# Patient Record
Sex: Female | Born: 1935 | Race: White | Hispanic: No | Marital: Married | Smoking: Former smoker
Health system: Southern US, Community
[De-identification: ages and names within clinical notes are randomized; demographics above are authoritative.]

## PROBLEM LIST (undated history)

## (undated) DIAGNOSIS — T7840XA Allergy, unspecified, initial encounter: Secondary | ICD-10-CM

## (undated) DIAGNOSIS — E785 Hyperlipidemia, unspecified: Secondary | ICD-10-CM

## (undated) DIAGNOSIS — C801 Malignant (primary) neoplasm, unspecified: Secondary | ICD-10-CM

## (undated) DIAGNOSIS — F32A Depression, unspecified: Secondary | ICD-10-CM

## (undated) DIAGNOSIS — D649 Anemia, unspecified: Secondary | ICD-10-CM

## (undated) DIAGNOSIS — J449 Chronic obstructive pulmonary disease, unspecified: Secondary | ICD-10-CM

## (undated) DIAGNOSIS — E119 Type 2 diabetes mellitus without complications: Secondary | ICD-10-CM

## (undated) DIAGNOSIS — M1990 Unspecified osteoarthritis, unspecified site: Secondary | ICD-10-CM

## (undated) DIAGNOSIS — F419 Anxiety disorder, unspecified: Secondary | ICD-10-CM

## (undated) DIAGNOSIS — F329 Major depressive disorder, single episode, unspecified: Secondary | ICD-10-CM

## (undated) DIAGNOSIS — R0902 Hypoxemia: Secondary | ICD-10-CM

## (undated) DIAGNOSIS — F3342 Major depressive disorder, recurrent, in full remission: Secondary | ICD-10-CM

## (undated) DIAGNOSIS — H269 Unspecified cataract: Secondary | ICD-10-CM

## (undated) DIAGNOSIS — M8008XA Age-related osteoporosis with current pathological fracture, vertebra(e), initial encounter for fracture: Secondary | ICD-10-CM

## (undated) DIAGNOSIS — R569 Unspecified convulsions: Secondary | ICD-10-CM

## (undated) DIAGNOSIS — I10 Essential (primary) hypertension: Secondary | ICD-10-CM

## (undated) DIAGNOSIS — J45909 Unspecified asthma, uncomplicated: Secondary | ICD-10-CM

## (undated) DIAGNOSIS — I2699 Other pulmonary embolism without acute cor pulmonale: Secondary | ICD-10-CM

## (undated) HISTORY — DX: Malignant (primary) neoplasm, unspecified: C80.1

## (undated) HISTORY — PX: BREAST SURGERY: SHX581

## (undated) HISTORY — DX: Major depressive disorder, single episode, unspecified: F32.9

## (undated) HISTORY — DX: Age-related osteoporosis with current pathological fracture, vertebra(e), initial encounter for fracture: M80.08XA

## (undated) HISTORY — DX: Unspecified asthma, uncomplicated: J45.909

## (undated) HISTORY — DX: Unspecified osteoarthritis, unspecified site: M19.90

## (undated) HISTORY — DX: Type 2 diabetes mellitus without complications: E11.9

## (undated) HISTORY — DX: Essential (primary) hypertension: I10

## (undated) HISTORY — DX: Hypoxemia: R09.02

## (undated) HISTORY — DX: Chronic obstructive pulmonary disease, unspecified: J44.9

## (undated) HISTORY — DX: Unspecified cataract: H26.9

## (undated) HISTORY — DX: Hyperlipidemia, unspecified: E78.5

## (undated) HISTORY — DX: Depression, unspecified: F32.A

## (undated) HISTORY — DX: Allergy, unspecified, initial encounter: T78.40XA

---

## 1998-04-04 ENCOUNTER — Ambulatory Visit (HOSPITAL_COMMUNITY): Admission: RE | Admit: 1998-04-04 | Discharge: 1998-04-04 | Payer: Self-pay | Admitting: *Deleted

## 2000-04-28 ENCOUNTER — Encounter: Admission: RE | Admit: 2000-04-28 | Discharge: 2000-04-28 | Payer: Self-pay | Admitting: Family Medicine

## 2000-04-28 ENCOUNTER — Encounter: Payer: Self-pay | Admitting: Family Medicine

## 2001-03-03 ENCOUNTER — Emergency Department (HOSPITAL_COMMUNITY): Admission: EM | Admit: 2001-03-03 | Discharge: 2001-03-03 | Payer: Self-pay | Admitting: Emergency Medicine

## 2001-04-29 ENCOUNTER — Encounter: Admission: RE | Admit: 2001-04-29 | Discharge: 2001-04-29 | Payer: Self-pay | Admitting: Family Medicine

## 2001-04-29 ENCOUNTER — Encounter: Payer: Self-pay | Admitting: Family Medicine

## 2002-04-28 ENCOUNTER — Encounter: Admission: RE | Admit: 2002-04-28 | Discharge: 2002-04-28 | Payer: Self-pay

## 2014-01-27 ENCOUNTER — Ambulatory Visit: Payer: Self-pay | Admitting: Family Medicine

## 2014-07-04 DIAGNOSIS — J449 Chronic obstructive pulmonary disease, unspecified: Secondary | ICD-10-CM | POA: Diagnosis not present

## 2014-08-04 DIAGNOSIS — J449 Chronic obstructive pulmonary disease, unspecified: Secondary | ICD-10-CM | POA: Diagnosis not present

## 2014-11-23 ENCOUNTER — Telehealth: Payer: Self-pay | Admitting: Family Medicine

## 2014-11-23 ENCOUNTER — Encounter: Payer: Self-pay | Admitting: Family Medicine

## 2014-11-23 ENCOUNTER — Ambulatory Visit (INDEPENDENT_AMBULATORY_CARE_PROVIDER_SITE_OTHER): Payer: Medicare Other | Admitting: Family Medicine

## 2014-11-23 VITALS — BP 140/68 | HR 97 | Temp 98.6°F | Resp 16 | Ht 65.0 in | Wt 177.7 lb

## 2014-11-23 DIAGNOSIS — F17210 Nicotine dependence, cigarettes, uncomplicated: Secondary | ICD-10-CM | POA: Insufficient documentation

## 2014-11-23 DIAGNOSIS — Z1389 Encounter for screening for other disorder: Secondary | ICD-10-CM | POA: Insufficient documentation

## 2014-11-23 DIAGNOSIS — Z853 Personal history of malignant neoplasm of breast: Secondary | ICD-10-CM | POA: Insufficient documentation

## 2014-11-23 DIAGNOSIS — Z8781 Personal history of (healed) traumatic fracture: Secondary | ICD-10-CM | POA: Insufficient documentation

## 2014-11-23 DIAGNOSIS — F3342 Major depressive disorder, recurrent, in full remission: Secondary | ICD-10-CM

## 2014-11-23 DIAGNOSIS — J45909 Unspecified asthma, uncomplicated: Secondary | ICD-10-CM | POA: Insufficient documentation

## 2014-11-23 DIAGNOSIS — E118 Type 2 diabetes mellitus with unspecified complications: Secondary | ICD-10-CM | POA: Insufficient documentation

## 2014-11-23 DIAGNOSIS — J0100 Acute maxillary sinusitis, unspecified: Secondary | ICD-10-CM

## 2014-11-23 DIAGNOSIS — IMO0001 Reserved for inherently not codable concepts without codable children: Secondary | ICD-10-CM | POA: Insufficient documentation

## 2014-11-23 DIAGNOSIS — Z23 Encounter for immunization: Secondary | ICD-10-CM | POA: Insufficient documentation

## 2014-11-23 DIAGNOSIS — Z0000 Encounter for general adult medical examination without abnormal findings: Secondary | ICD-10-CM | POA: Insufficient documentation

## 2014-11-23 DIAGNOSIS — Z8719 Personal history of other diseases of the digestive system: Secondary | ICD-10-CM | POA: Insufficient documentation

## 2014-11-23 DIAGNOSIS — J441 Chronic obstructive pulmonary disease with (acute) exacerbation: Secondary | ICD-10-CM | POA: Insufficient documentation

## 2014-11-23 DIAGNOSIS — J309 Allergic rhinitis, unspecified: Secondary | ICD-10-CM | POA: Insufficient documentation

## 2014-11-23 DIAGNOSIS — K5790 Diverticulosis of intestine, part unspecified, without perforation or abscess without bleeding: Secondary | ICD-10-CM | POA: Insufficient documentation

## 2014-11-23 DIAGNOSIS — J449 Chronic obstructive pulmonary disease, unspecified: Secondary | ICD-10-CM | POA: Insufficient documentation

## 2014-11-23 DIAGNOSIS — M792 Neuralgia and neuritis, unspecified: Secondary | ICD-10-CM | POA: Insufficient documentation

## 2014-11-23 DIAGNOSIS — E785 Hyperlipidemia, unspecified: Secondary | ICD-10-CM | POA: Insufficient documentation

## 2014-11-23 DIAGNOSIS — M79673 Pain in unspecified foot: Secondary | ICD-10-CM | POA: Insufficient documentation

## 2014-11-23 DIAGNOSIS — Z9181 History of falling: Secondary | ICD-10-CM | POA: Insufficient documentation

## 2014-11-23 HISTORY — DX: Major depressive disorder, recurrent, in full remission: F33.42

## 2014-11-23 MED ORDER — AMOXICILLIN-POT CLAVULANATE 875-125 MG PO TABS: 1.0000 | ORAL_TABLET | Freq: Two times a day (BID) | ORAL | Status: DC

## 2014-11-23 NOTE — Progress Notes (Signed)
Name: Jocelyn Burgess   MRN: [id number]    DOB: [birth year]   Date:11/23/2014       Progress Note  Subjective  Chief Complaint  Chief Complaint  Patient presents with  . Sinusitis    patient has been having facial pain & pressure for about a week or so.    HPI  Patient is here today with concerns regarding the following symptoms congestion, post nasal drip, ear pressure and sinus pressure that started days ago.  Associated with fatigue. Not associated with fevers, chills, headaches. Has tried the following home remedies: none.    Past Medical History  Diagnosis Date  . Allergy   . Cancer   . Diabetes mellitus without complication   . Arthritis   . Asthma   . Cataract   . COPD (chronic obstructive pulmonary disease)   . Depression     sometimes  . Hyperlipidemia   . Hypertension   . Oxygen deficiency     History  Substance Use Topics  . Smoking status: Current Every Day Smoker    Types: Cigarettes  . Smokeless tobacco: Current User  . Alcohol Use: No     Current outpatient prescriptions:  .  albuterol (VENTOLIN HFA) 108 (90 BASE) MCG/ACT inhaler, Inhale into the lungs., Disp: , Rfl:  .  citalopram (CELEXA) 20 MG tablet, Take by mouth., Disp: , Rfl:  .  cloNIDine (CATAPRES) 0.1 MG tablet, Take by mouth., Disp: , Rfl:  .  diazepam (VALIUM) 5 MG tablet, Take by mouth., Disp: , Rfl:  .  gabapentin (NEURONTIN) 300 MG capsule, Take by mouth., Disp: , Rfl:  .  glimepiride (AMARYL) 2 MG tablet, Take by mouth., Disp: , Rfl:  .  glucose blood (ONETOUCH VERIO) test strip, ONETOUCH VERIO (In Vitro Strip)  1 (one) Strip Strip use as directed to check blood glucose bid for 90 days  Quantity: 180;  Refills: 1   Ordered :27-Mar-2014  Bobetta Lime MD;  Started 27-Mar-2014 Active, Disp: , Rfl:  .  hydrochlorothiazide (HYDRODIURIL) 25 MG tablet, Take by mouth., Disp: , Rfl:  .  loratadine (CLARITIN) 10 MG tablet, Take by mouth., Disp: , Rfl:  .  lovastatin (MEVACOR) 20 MG tablet,  Take by mouth., Disp: , Rfl:  .  meclizine (ANTIVERT) 25 MG tablet, Take by mouth., Disp: , Rfl:  .  meloxicam (MOBIC) 15 MG tablet, Take by mouth., Disp: , Rfl:  .  metFORMIN (GLUCOPHAGE) 500 MG tablet, Take by mouth., Disp: , Rfl:  .  metFORMIN (GLUCOPHAGE) 500 MG tablet, Take by mouth., Disp: , Rfl:  .  montelukast (SINGULAIR) 10 MG tablet, Take by mouth., Disp: , Rfl:  .  predniSONE (DELTASONE) 5 MG tablet, Take by mouth., Disp: , Rfl:  .  theophylline (THEODUR) 200 MG 12 hr tablet, Take by mouth., Disp: , Rfl:  .  tiotropium (SPIRIVA HANDIHALER) 18 MCG inhalation capsule, Place into inhaler and inhale., Disp: , Rfl:  .  aspirin 81 MG tablet, Take by mouth., Disp: , Rfl:  .  gemfibrozil (LOPID) 600 MG tablet, Take by mouth., Disp: , Rfl:  .  HYDROcodone-acetaminophen (NORCO) 5-325 MG per tablet, Take by mouth., Disp: , Rfl:   Allergies  Allergen Reactions  . Ciprofloxacin   . Inhaler Decongestant  [Methamphetamine]   . Molds & Smuts     ROS  10 Systems reviewed and is negative except as mentioned in HPI.   Objective  Filed Vitals:   11/23/14 1601  BP: 140/68  Pulse: 97  Temp: 98.6 F (37 C)  TempSrc: Oral  Resp: 16  Height: 5\' 5"  (1.651 m)  Weight: 177 lb 11.2 oz (80.604 kg)  SpO2: 91%     Physical Exam  Constitutional: Patient appears well-developed and well-nourished. In no acute distress but does appear to be fatigued from acute illness. HEENT:  - Head: Normocephalic and atraumatic.  - Ears: RIGHT TM bulging with minimal clear exudate, LEFT TM bulging with minimal clear exudate.  - Nose: Nasal mucosa boggy and congested.  - Mouth/Throat: Oropharynx is moist with slight erythema of bilateral tonsils without hypertrophy or exudates. Post nasal drainage present.  - Eyes: Conjunctivae clear, EOM movements normal. PERRLA. No scleral icterus.  Neck: Normal range of motion. Neck supple. No JVD present. No thyromegaly present. No local lymphadenopathy. Cardiovascular:  Regular rate, regular rhythm with no murmurs heard.  Pulmonary/Chest: Effort normal and breath sounds reduced throughout lung fields which is her baseline. Musculoskeletal: Normal range of motion bilateral UE and LE, no joint effusions. Skin: Skin is warm and dry. No rash noted. Psychiatric: Patient has a normal mood and affect. Behavior is normal in office today. Judgment and thought content normal in office today.   Assessment & Plan  1. Acute maxillary sinusitis, recurrence not specified  - amoxicillin-clavulanate (AUGMENTIN) 875-125 MG per tablet; Take 1 tablet by mouth 2 (two) times daily.  Dispense: 20 tablet; Refill: 0  Etiologies include allergic rhinitis, viral or bacterial infection. Instructed patient on increasing hydration, nasal saline spray, steam inhalation, NSAID if tolerated and not contraindicated. If not already doing so start taking daily anti-histamine and use a steroid nasal spray. If symptoms persist/worsen may consider antibiotic therapy.

## 2014-11-23 NOTE — Patient Instructions (Signed)

## 2014-11-23 NOTE — Telephone Encounter (Signed)
Pt's husband called stating that his wife has been experiencing sinus headaches and wanted to know what she could do for it or if something could be called into her pharmacy.  [facility] [address]

## 2014-11-23 NOTE — Telephone Encounter (Signed)
Contacted patient to see how I can be of assistance to her. She stated that she was very congested, had sinus pain and pressure. Patient was asked if she could come in today. Patient agreed and was scheduled today @ 3:30pm

## 2014-11-30 ENCOUNTER — Other Ambulatory Visit: Payer: Self-pay | Admitting: Family Medicine

## 2014-11-30 DIAGNOSIS — E785 Hyperlipidemia, unspecified: Secondary | ICD-10-CM

## 2014-12-04 ENCOUNTER — Telehealth: Payer: Self-pay | Admitting: Family Medicine

## 2014-12-04 NOTE — Telephone Encounter (Signed)
Patient's husband was informed of Dr. Allie Dimmer instructions/ recommendations and I encouraged him to made sure she takes the medications as directed by Dr. Nadine Counts. Patient's huband was advised that she takes no more than 2000mg /dayof the Metformin and that it was only to decrease her sugar levels. Patient expressed verbal understand and stated thanks for calling me back.

## 2014-12-04 NOTE — Telephone Encounter (Signed)
Patient's husband called stating that his wife's blood sugar normally runs 87-117 but today it was 247 after eating. Patient ate a couple of eggs, gravy with toast. Patient has not taken any prednisone since Saturday. Patient was not able to get the Theodur. Patient's husband stated that she has not had any symptoms like shakes, dizziness, etc. Patient wants to know if she could get back on the [facility] (they told them they only had 19 pills).

## 2014-12-04 NOTE — Telephone Encounter (Signed)
Please explain to patient and husband: Jocelyn Burgess (aka Theophylline) is an old medication used for asthma/copd as a last resort to help with inflammation in the lungs. Back in Feb 2016 this medication was nationally backordered and so due to the shortage we discussed putting her on prednisone if needed. She should currently be on prednisone 5mg  once a day to help her breathing symptoms.   Her sugars being in the 200s should not be related to Jocelyn Burgess. However sugars can rise with use of Prednisone. Carbohydrates found in gravy and toast can increase blood sugars. Her two medications for blood sugars are Glimepiride 2mg  twice a day and Metformin 500mg  twice a day. If she is still having sugars in the 200s start taking Metformin 500mg  tablet THREE or FOUR times a day (so max total is 2000mg /day). If this helps let me know to change the medication quantity and send to their local pharmacy.   If this does not help and her sugars continue to trend in the 200s or higher or she has issues with her COPD/Asthma then I will have to see her in the office. Thank you.

## 2014-12-11 ENCOUNTER — Other Ambulatory Visit: Payer: Self-pay

## 2014-12-12 ENCOUNTER — Telehealth: Payer: Self-pay | Admitting: Family Medicine

## 2014-12-12 ENCOUNTER — Other Ambulatory Visit: Payer: Self-pay

## 2014-12-12 DIAGNOSIS — E113299 Type 2 diabetes mellitus with mild nonproliferative diabetic retinopathy without macular edema, unspecified eye: Secondary | ICD-10-CM

## 2014-12-12 MED ORDER — GLUCOSE BLOOD VI STRP: ORAL_STRIP | Status: DC

## 2014-12-12 NOTE — Telephone Encounter (Signed)
Pt only has 3 tests strips left of Accu-Check Aviva Plus. Please send [facility] in [place]. Please call once completed. [phone]

## 2014-12-12 NOTE — Telephone Encounter (Signed)
Samson Frederic at 12/12/2014 9:45 AM     Status: Signed       Expand All Collapse All   Pt only has 3 tests strips left of Accu-Check Aviva Plus. Please send [facility] in [place]. Please call once completed. [phone]

## 2014-12-12 NOTE — Telephone Encounter (Signed)
Refill request was submitted to Dr. Bobetta Lime

## 2014-12-12 NOTE — Telephone Encounter (Signed)
Strips were electronically submitted

## 2014-12-13 ENCOUNTER — Other Ambulatory Visit: Payer: Self-pay

## 2014-12-13 ENCOUNTER — Telehealth: Payer: Self-pay | Admitting: Family Medicine

## 2014-12-13 DIAGNOSIS — E113299 Type 2 diabetes mellitus with mild nonproliferative diabetic retinopathy without macular edema, unspecified eye: Secondary | ICD-10-CM

## 2014-12-13 MED ORDER — GLUCOSE BLOOD VI STRP: ORAL_STRIP | Status: DC

## 2014-12-13 NOTE — Telephone Encounter (Signed)
Patient's husband came in stating that [facility] said her strips will not be sent until the 9th so he needs something now to [facility] [address]. rx was sent to Dr. Nadine Counts for approval & submission.

## 2014-12-13 NOTE — Telephone Encounter (Signed)
[facility] in [place], spoke to [employer] [facility]) at 12:04pm, and she confirmed the rx was sent yesterday electronically.

## 2014-12-13 NOTE — Telephone Encounter (Signed)
Was told the the accu check test strips was electronically sent to the pharmacy on yesterday, he just called them and they do not have it. Please resend thank you

## 2014-12-14 NOTE — Telephone Encounter (Signed)
Complete

## 2014-12-17 ENCOUNTER — Other Ambulatory Visit: Payer: Self-pay | Admitting: Family Medicine

## 2014-12-17 DIAGNOSIS — I10 Essential (primary) hypertension: Secondary | ICD-10-CM

## 2014-12-30 ENCOUNTER — Other Ambulatory Visit: Payer: Self-pay | Admitting: Family Medicine

## 2014-12-30 DIAGNOSIS — J449 Chronic obstructive pulmonary disease, unspecified: Secondary | ICD-10-CM

## 2015-01-05 ENCOUNTER — Telehealth: Payer: Self-pay

## 2015-01-10 ENCOUNTER — Other Ambulatory Visit: Payer: Self-pay | Admitting: Family Medicine

## 2015-01-10 DIAGNOSIS — I10 Essential (primary) hypertension: Secondary | ICD-10-CM

## 2015-01-11 ENCOUNTER — Other Ambulatory Visit: Payer: Self-pay | Admitting: Family Medicine

## 2015-01-11 DIAGNOSIS — E781 Pure hyperglyceridemia: Secondary | ICD-10-CM

## 2015-01-14 ENCOUNTER — Other Ambulatory Visit: Payer: Self-pay | Admitting: Family Medicine

## 2015-01-14 DIAGNOSIS — F419 Anxiety disorder, unspecified: Principal | ICD-10-CM

## 2015-01-14 DIAGNOSIS — F329 Major depressive disorder, single episode, unspecified: Secondary | ICD-10-CM

## 2015-01-14 DIAGNOSIS — F32A Depression, unspecified: Secondary | ICD-10-CM

## 2015-01-15 ENCOUNTER — Telehealth: Payer: Self-pay | Admitting: Family Medicine

## 2015-01-15 NOTE — Telephone Encounter (Signed)
I printed it out this morning, can't be faxed it's a controlled substance.

## 2015-01-15 NOTE — Telephone Encounter (Signed)
ALL RX WERE GOT EXCEPT FOR HER DIAZIPAM WAS NOT THERE. PLEASE REFILL FOR THE PATIENT IS OUT. [facility] [address]

## 2015-01-15 NOTE — Telephone Encounter (Signed)
Patient was called this morning at 8:58am, which is noted on the printed rx, and told that her rx was ready for pick up.

## 2015-01-22 ENCOUNTER — Ambulatory Visit (INDEPENDENT_AMBULATORY_CARE_PROVIDER_SITE_OTHER): Payer: Medicare Other | Admitting: Family Medicine

## 2015-01-22 ENCOUNTER — Encounter: Payer: Self-pay | Admitting: Family Medicine

## 2015-01-22 ENCOUNTER — Other Ambulatory Visit: Payer: Self-pay | Admitting: Family Medicine

## 2015-01-22 VITALS — BP 110/60 | HR 88 | Temp 97.2°F | Resp 20 | Ht 65.0 in | Wt 183.7 lb

## 2015-01-22 DIAGNOSIS — E118 Type 2 diabetes mellitus with unspecified complications: Secondary | ICD-10-CM | POA: Diagnosis not present

## 2015-01-22 DIAGNOSIS — F329 Major depressive disorder, single episode, unspecified: Secondary | ICD-10-CM

## 2015-01-22 DIAGNOSIS — E785 Hyperlipidemia, unspecified: Secondary | ICD-10-CM

## 2015-01-22 DIAGNOSIS — F418 Other specified anxiety disorders: Secondary | ICD-10-CM | POA: Diagnosis not present

## 2015-01-22 DIAGNOSIS — I10 Essential (primary) hypertension: Secondary | ICD-10-CM

## 2015-01-22 DIAGNOSIS — R42 Dizziness and giddiness: Secondary | ICD-10-CM

## 2015-01-22 DIAGNOSIS — F419 Anxiety disorder, unspecified: Secondary | ICD-10-CM

## 2015-01-22 LAB — GLUCOSE, POCT (MANUAL RESULT ENTRY): POC Glucose: 200 mg/dl — AB (ref 70–99)

## 2015-01-22 MED ORDER — METFORMIN HCL 500 MG PO TABS: 1000.0000 mg | ORAL_TABLET | Freq: Two times a day (BID) | ORAL | Status: DC

## 2015-01-22 MED ORDER — METFORMIN HCL 1000 MG PO TABS: 1000.0000 mg | ORAL_TABLET | Freq: Two times a day (BID) | ORAL | Status: DC

## 2015-01-22 MED ORDER — CITALOPRAM HYDROBROMIDE 20 MG PO TABS: 20.0000 mg | ORAL_TABLET | Freq: Every day | ORAL | Status: DC

## 2015-01-22 NOTE — Progress Notes (Signed)
Name: Jocelyn Burgess   MRN: [id number]    DOB: 12/20/1931   Date:01/22/2015       Progress Note  Subjective  Chief Complaint  Chief Complaint  Patient presents with  . Medication Refill    HPI  Patient is here for routine follow up of Hypertension. First diagnosed with hypertension several years ago. Current anti-hypertension medication regimen includes dietary modification, weight management and clonidine 0.1mg  twice a day, HCTZ 25mg  daily. Patient is following physician recommended management. Checking blood pressure outside of physician office. Results average systolic 235-361 and average diastolic 44-31.  Associated symptoms do not include headache, dizziness, nausea, lower extremity swelling, shortness of breath worse than usual, chest pain, numbness.  Patient is here for routine follow up of Diabetes Type II.  Current diabetes medication regimen includes glimepiride 2mg  twice a day plus metformin 500mg  twice a day.  About 1 month ago patient's husband had called concerned that Jocelyn Burgess's blood glucose had gone from the lower 100s to the 200s.  He was instructed to increase her metformin 500mg  to 3 or 4 times a day, no more than 2000mg /day. Patient is taking medications as instructed. Overall the patient feels that their blood glucose is not well controlled. Checking blood glucose 2-3 times a day consistently. Fasting glucose range 110-135. Meal time glucose range 150s. Lowest glucose result 78. Patient denies any related symptoms such as blurry vision, increased fatigue, polydipsia, polyphagia, polyuria, poor wound healing, pruritus, skin dryness and weight loss.  Last Hba1c was done 10/05/14 and was 7.5%. Related medical conditions include Hyperlipidemia for which she is currently taking Lopid 600mg  twice a day and Lovastatin 20mg  once a day.  Glycemic control is complicated by poor COPD control, poor compliance with smoking cessation efforts and thus needing daily prednisone 5mg  since  Theophyline was nationally backordered earlier this year.   Needs refill of celexa.   Past Medical History  Diagnosis Date  . Allergy   . Cancer   . Diabetes mellitus without complication   . Arthritis   . Asthma   . Cataract   . COPD (chronic obstructive pulmonary disease)   . Depression     sometimes  . Hyperlipidemia   . Hypertension   . Oxygen deficiency     Past Surgical History  Procedure Laterality Date  . Breast surgery Left     Family History  Problem Relation Age of Onset  . Arthritis Mother   . Arthritis Father   . Cancer Sister     History   Social History  . Marital Status: Married    Spouse Name: N/A  . Number of Children: N/A  . Years of Education: N/A   Occupational History  . Not on file.   Social History Main Topics  . Smoking status: Current Every Day Smoker    Types: Cigarettes  . Smokeless tobacco: Current User  . Alcohol Use: No  . Drug Use: No  . Sexual Activity: No   Other Topics Concern  . Not on file   Social History Narrative     Current outpatient prescriptions:  .  albuterol (VENTOLIN HFA) 108 (90 BASE) MCG/ACT inhaler, Inhale into the lungs., Disp: , Rfl:  .  aspirin 81 MG tablet, Take by mouth., Disp: , Rfl:  .  citalopram (CELEXA) 20 MG tablet, Take by mouth., Disp: , Rfl:  .  cloNIDine (CATAPRES) 0.1 MG tablet, TAKE 1 TABLET BY MOUTH TWICE A DAY, Disp: 180 tablet, Rfl: 3 .  diazepam (  VALIUM) 5 MG tablet, TAKE 1 TABLET BY MOUTH TWICE A DAY, Disp: 180 tablet, Rfl: 1 .  gabapentin (NEURONTIN) 300 MG capsule, Take by mouth., Disp: , Rfl:  .  gemfibrozil (LOPID) 600 MG tablet, TAKE 1 TABLET BY MOUTH TWICE A DAY, Disp: 180 tablet, Rfl: 3 .  glimepiride (AMARYL) 2 MG tablet, Take by mouth., Disp: , Rfl:  .  glucose blood (ACCU-CHEK AVIVA PLUS) test strip, Use as instructed to check blood sugar 3x/day, Disp: 100 each, Rfl: 5 .  hydrochlorothiazide (HYDRODIURIL) 25 MG tablet, TAKE 1 TABLET BY MOUTH DAILY, Disp: 90 tablet,  Rfl: 2 .  HYDROcodone-acetaminophen (NORCO) 5-325 MG per tablet, Take by mouth., Disp: , Rfl:  .  loratadine (CLARITIN) 10 MG tablet, Take by mouth., Disp: , Rfl:  .  lovastatin (MEVACOR) 20 MG tablet, TAKE 1 TABLET BY MOUTH EVERY DAY WITH EVENING MEAL, Disp: 90 tablet, Rfl: 1 .  meclizine (ANTIVERT) 25 MG tablet, TAKE 1 TABLET BY MOUTH EVERY 8 HOURS AS NEEDED, Disp: 200 tablet, Rfl: 1 .  meloxicam (MOBIC) 15 MG tablet, Take by mouth., Disp: , Rfl:  .  metFORMIN (GLUCOPHAGE) 500 MG tablet, Take by mouth., Disp: , Rfl:  .  montelukast (SINGULAIR) 10 MG tablet, TAKE 1 TABLET BY MOUTH EVERY EVENING, Disp: 90 tablet, Rfl: 3 .  predniSONE (DELTASONE) 5 MG tablet, Take by mouth., Disp: , Rfl:  .  theophylline (THEODUR) 200 MG 12 hr tablet, Take by mouth., Disp: , Rfl:  .  tiotropium (SPIRIVA HANDIHALER) 18 MCG inhalation capsule, Place into inhaler and inhale., Disp: , Rfl:   Allergies  Allergen Reactions  . Ciprofloxacin   . Inhaler Decongestant  [Methamphetamine]   . Molds & Smuts      ROS  CONSTITUTIONAL: No significant weight changes, fever, chills, weakness or fatigue.  HEENT:  - Eyes: No visual changes.  - Ears: No auditory changes. No pain.  - Nose: No sneezing, congestion, runny nose. - Throat: No sore throat. No changes in swallowing. SKIN: No rash or itching.  CARDIOVASCULAR: No chest pain, chest pressure or chest discomfort. No palpitations or edema.  RESPIRATORY: No shortness of breath, cough or sputum.  GASTROINTESTINAL: No anorexia, nausea, vomiting. No changes in bowel habits. No abdominal pain or blood.  GENITOURINARY: No dysuria. No frequency. No discharge.  NEUROLOGICAL: No headache, dizziness, syncope, paralysis, ataxia, numbness or tingling in the extremities. No memory changes. No change in bowel or bladder control.  MUSCULOSKELETAL: No joint pain. No muscle pain. HEMATOLOGIC: No anemia, bleeding or bruising.  LYMPHATICS: No enlarged lymph nodes.  PSYCHIATRIC: No  change in mood. No change in sleep pattern.  ENDOCRINOLOGIC: No reports of sweating, cold or heat intolerance. No polyuria or polydipsia.   Objective  Filed Vitals:   01/22/15 1512  BP: 110/60  Pulse: 88  Temp: 97.2 F (36.2 C)  TempSrc: Oral  Resp: 20  Height: 5\' 5"  (1.651 m)  Weight: 183 lb 11.2 oz (83.326 kg)  SpO2: 94%   Body mass index is 30.57 kg/(m^2).   POCT Glucose today 200mg /dL  Physical Exam  Constitutional: Patient appears well-developed and well-nourished. In no distress. Smells of cigarette smoke as usual. HEENT:  - Head: Normocephalic and atraumatic.  - Ears: Bilateral TMs gray, no erythema or effusion - Nose: Nasal mucosa moist, boggy - Mouth/Throat: Oropharynx is clear and moist. No tonsillar hypertrophy or erythema. No post nasal drainage.  - Eyes: Conjunctivae clear, EOM movements normal. PERRLA. No scleral icterus.  Neck: Normal range  of motion. Neck supple. No JVD present. No thyromegaly present.  Cardiovascular: Normal rate, regular rhythm and normal heart sounds.  No murmur heard.  Pulmonary/Chest: Effort normal and breath sounds decreased in all lung fields (baseline). No respiratory distress. Musculoskeletal: Normal range of motion bilateral UE and LE, no joint effusions. Peripheral vascular: Bilateral LE no edema. Neurological: CN II-XII grossly intact with no focal deficits. Alert and oriented to person, place, and time. Coordination, balance, strength, speech and gait are normal.  Skin: Skin is warm and dry. No rash noted. No erythema.  Psychiatric: Patient has a normal mood and affect. Behavior is normal in office today. Judgment and thought content normal in office today.   Assessment & Plan  1. Diabetes mellitus type 2, controlled, with complications Continue increased dose of metformin 1000mg  twice a day. Monitor for low sugars.   Patient's Hba1c goal is <8% previously which is reasonable if elderly and is at risk for hypoglycemic events  especially on sulfonyluria which she is not willing to discontinue due to cost.   Encouraged patient to continue efforts on checking blood glucose on a daily basis. Fasting blood glucose control goal is 100-130mg /dL and post prandial blood glucose control is 180mg /dL.  Reviewed diet, exercise, lifestyle changes and current medication regimen pertaining to diabetes with the patient.     - POCT glucose (manual entry); Standing - POCT glucose (manual entry) - metFORMIN (GLUCOPHAGE) 1000 MG tablet; Take 1 tablet by mouth 2 (two) times daily with a meal.  Dispense: 180 tablet; Refill: 3  2. HLD (hyperlipidemia) Continue statin therapy.  3. Hypertension goal BP (blood pressure) < 150/90 Well controled.  4. Anxiety and depression Well controled, refill.  - citalopram (CELEXA) 20 MG tablet; Take 1 tablet (20 mg total) by mouth daily.  Dispense: 90 tablet; Refill: 3

## 2015-01-31 ENCOUNTER — Other Ambulatory Visit: Payer: Self-pay | Admitting: Family Medicine

## 2015-01-31 DIAGNOSIS — E118 Type 2 diabetes mellitus with unspecified complications: Secondary | ICD-10-CM

## 2015-02-06 NOTE — Telephone Encounter (Signed)
ERRONEOUS ENTRY.

## 2015-02-09 ENCOUNTER — Other Ambulatory Visit: Payer: Self-pay | Admitting: Family Medicine

## 2015-02-09 ENCOUNTER — Telehealth: Payer: Self-pay | Admitting: Family Medicine

## 2015-02-09 DIAGNOSIS — J449 Chronic obstructive pulmonary disease, unspecified: Secondary | ICD-10-CM

## 2015-02-09 MED ORDER — THEOPHYLLINE ER 400 MG PO CP24: 400.0000 mg | ORAL_CAPSULE | Freq: Every day | ORAL | Status: DC

## 2015-02-09 NOTE — Telephone Encounter (Signed)
Done

## 2015-02-09 NOTE — Telephone Encounter (Signed)
PT HAS BEEN TAKING THEOPHYLLINE 200MG  12 HR TABLETS. [facility] THIS [facility]ICATION. [facility] ON [address] CAN 400 MG EXTENDED RELEASE AND WANTS TO KNOW IF YOU COULD CALL THIS IN TO THEM AT [facility]. PLEASE LET THEM KNOW WHEN THIS IS DONE.

## 2015-02-09 NOTE — Telephone Encounter (Signed)
Refill request was sent to Dr. Ashany Sundaram for approval and submission.  

## 2015-02-14 ENCOUNTER — Telehealth: Payer: Self-pay | Admitting: Family Medicine

## 2015-02-14 NOTE — Telephone Encounter (Signed)
[name] just wanted to inform you that he found the medication Theophylline 400mg  at [facility] on [address].

## 2015-02-20 ENCOUNTER — Telehealth: Payer: Self-pay | Admitting: Family Medicine

## 2015-02-20 NOTE — Telephone Encounter (Signed)
Pt would like a call back about her sugar. Pt states it seems to be running a little high. This morning it was running 187. She is currently on the metformin.

## 2015-02-20 NOTE — Telephone Encounter (Signed)
Contacted this patient to find out how we could be of assistance to her. Patient stated that she has been taking (2) 1000mg  of Metformin and it has still been running way over 100. Today it was 187mg . After consulting with Dr. Nadine Counts, patient was scheduled for an office visit tomorrow (02/21/15) at 9:30am.

## 2015-02-21 ENCOUNTER — Ambulatory Visit (INDEPENDENT_AMBULATORY_CARE_PROVIDER_SITE_OTHER): Payer: Medicare Other | Admitting: Family Medicine

## 2015-02-21 ENCOUNTER — Encounter: Payer: Self-pay | Admitting: Family Medicine

## 2015-02-21 VITALS — BP 112/60 | HR 110 | Temp 97.9°F | Resp 18 | Ht 65.0 in | Wt 185.5 lb

## 2015-02-21 DIAGNOSIS — F329 Major depressive disorder, single episode, unspecified: Secondary | ICD-10-CM

## 2015-02-21 DIAGNOSIS — Z23 Encounter for immunization: Secondary | ICD-10-CM | POA: Diagnosis not present

## 2015-02-21 DIAGNOSIS — F32A Depression, unspecified: Secondary | ICD-10-CM

## 2015-02-21 DIAGNOSIS — I10 Essential (primary) hypertension: Secondary | ICD-10-CM

## 2015-02-21 DIAGNOSIS — F419 Anxiety disorder, unspecified: Secondary | ICD-10-CM

## 2015-02-21 DIAGNOSIS — F418 Other specified anxiety disorders: Secondary | ICD-10-CM

## 2015-02-21 DIAGNOSIS — E118 Type 2 diabetes mellitus with unspecified complications: Secondary | ICD-10-CM | POA: Diagnosis not present

## 2015-02-21 LAB — POCT GLYCOSYLATED HEMOGLOBIN (HGB A1C): Hemoglobin A1C: 7.4

## 2015-02-21 MED ORDER — GLIMEPIRIDE 4 MG PO TABS: 4.0000 mg | ORAL_TABLET | Freq: Two times a day (BID) | ORAL | Status: DC

## 2015-02-21 NOTE — Progress Notes (Signed)
Name: Jocelyn Burgess   MRN: [id number]    DOB: April 22, 1932   Date:02/21/2015       Progress Note  Subjective  Chief Complaint  Chief Complaint  Patient presents with  . Medication Management    patient has been having some issues with her blood sugars running high.    HPI  Patient is here for routine follow up of Diabetes Type II.  Current diabetes medication regimen includes glimepiride 4 mg twice a day plus metformin 1000 mg twice a day. Jocelyn Burgess's blood glucose has been fluctuating more, gone from the lower 100s to the 200s over the past few months. Patient is taking medications as instructed. Overall the patient feels that their blood glucose is not well controlled. Checking blood glucose 2-3 times a day consistently. Fasting glucose range 110-135. Meal time glucose range 150-200s. Lowest glucose result 89. Patient denies any related symptoms such as blurry vision, increased fatigue, polydipsia, polyphagia, polyuria, poor wound healing, pruritus, skin dryness and weight loss. Last Hba1c was done 10/05/14 and was 7.5%. Related medical conditions include Hyperlipidemia for which she is currently taking Lopid 600 mg twice a day and Lovastatin 20 mg once a day. Glycemic control is complicated by poor COPD control, poor compliance with smoking cessation efforts and thus needing daily prednisone 5mg  since Jocelyn Burgess was nationally backordered earlier this year. She reports today being back on Jocelyn Burgess for 1 week now and off of the prednisone.  Patient is here for routine follow up of Hypertension. First diagnosed with hypertension several years ago. Current anti-hypertension medication regimen includes dietary modification, weight management and clonidine 0.1mg  twice a day, HCTZ 25mg  daily. Patient is following physician recommended management. Checking blood pressure outside of physician office. Results average systolic 536-644 and average diastolic 03-47. Associated symptoms do not  include headache, dizziness, nausea, lower extremity swelling, shortness of breath worse than usual, chest pain, numbness.   Depression: Patient complains of resolved depression. She complains of fatigue. Onset was approximately several years ago, completely resolved since that time.  She denies current suicidal and homicidal plan or intent.   Family history significant for anxiety, depression and heart disease.Possible organic causes contributing are: medications, endocrine/metabolic, nutritional.  Risk factors: previous episode of depression Previous treatment includes Celexa and individual therapy.  TODAY SHE REPORTS SELF DISCONTINUING HER CELEXA 3 WEEKS AGO WITH NO REPORTED SIDE EFFECTS.   Patient Active Problem List   Diagnosis Date Noted  . Need for immunization against influenza 02/21/2015  . Hypertension goal BP (blood pressure) < 140/90 01/22/2015  . Acute antritis 11/23/2014  . Allergic rhinitis 11/23/2014  . Anxiety and depression 11/23/2014  . AB (asthmatic bronchitis) 11/23/2014  . Bronchitis with chronic airway obstruction 11/23/2014  . Foot pain 11/23/2014  . Cigarette smoker 11/23/2014  . COPD, severe 11/23/2014  . DD (diverticular disease) 11/23/2014  . Chronic obstructive pulmonary emphysema 11/23/2014  . Personal history of malignant neoplasm of breast 11/23/2014  . History of digestive disease 11/23/2014  . Personal history of healed traumatic fracture 11/23/2014  . HLD (hyperlipidemia) 11/23/2014  . Peripheral neuropathic pain 11/23/2014  . At risk for falling 11/23/2014  . Diabetes mellitus type 2, controlled, with complications 42/59/5638    Social History  Substance Use Topics  . Smoking status: Current Every Day Smoker    Types: Cigarettes  . Smokeless tobacco: Current User  . Alcohol Use: No     Current outpatient prescriptions:  .  albuterol (VENTOLIN HFA) 108 (90 BASE) MCG/ACT inhaler, Inhale  into the lungs., Disp: , Rfl:  .  aspirin 81 MG tablet,  Take by mouth., Disp: , Rfl:  .  cloNIDine (CATAPRES) 0.1 MG tablet, TAKE 1 TABLET BY MOUTH TWICE A DAY, Disp: 180 tablet, Rfl: 3 .  diazepam (VALIUM) 5 MG tablet, TAKE 1 TABLET BY MOUTH TWICE A DAY, Disp: 180 tablet, Rfl: 1 .  gabapentin (NEURONTIN) 300 MG capsule, Take by mouth., Disp: , Rfl:  .  gemfibrozil (LOPID) 600 MG tablet, TAKE 1 TABLET BY MOUTH TWICE A DAY, Disp: 180 tablet, Rfl: 3 .  glimepiride (AMARYL) 4 MG tablet, Take 1 tablet (4 mg total) by mouth 2 (two) times daily., Disp: 180 tablet, Rfl: 3 .  glucose blood (ACCU-CHEK AVIVA PLUS) test strip, Use as instructed to check blood sugar 3x/day, Disp: 100 each, Rfl: 5 .  hydrochlorothiazide (HYDRODIURIL) 25 MG tablet, TAKE 1 TABLET BY MOUTH DAILY, Disp: 90 tablet, Rfl: 2 .  HYDROcodone-acetaminophen (NORCO) 5-325 MG per tablet, Take by mouth., Disp: , Rfl:  .  loratadine (CLARITIN) 10 MG tablet, Take by mouth., Disp: , Rfl:  .  lovastatin (MEVACOR) 20 MG tablet, TAKE 1 TABLET BY MOUTH EVERY DAY WITH EVENING MEAL, Disp: 90 tablet, Rfl: 1 .  meclizine (ANTIVERT) 25 MG tablet, TAKE 1 TABLET BY MOUTH EVERY 8 HOURS AS NEEDED, Disp: 200 tablet, Rfl: 1 .  meloxicam (MOBIC) 15 MG tablet, Take by mouth., Disp: , Rfl:  .  metFORMIN (GLUCOPHAGE) 1000 MG tablet, Take 1 tablet (1,000 mg total) by mouth 2 (two) times daily with a meal., Disp: 180 tablet, Rfl: 3 .  montelukast (SINGULAIR) 10 MG tablet, TAKE 1 TABLET BY MOUTH EVERY EVENING, Disp: 90 tablet, Rfl: 3 .  predniSONE (DELTASONE) 5 MG tablet, Take by mouth., Disp: , Rfl:  .  theophylline (THEO-24) 400 MG 24 hr capsule, Take 1 capsule (400 mg total) by mouth daily., Disp: 90 capsule, Rfl: 3 .  tiotropium (SPIRIVA HANDIHALER) 18 MCG inhalation capsule, Place into inhaler and inhale., Disp: , Rfl:   Past Surgical History  Procedure Laterality Date  . Breast surgery Left     Family History  Problem Relation Age of Onset  . Arthritis Mother   . Arthritis Father   . Cancer Sister      brain  . Heart disease Sister   . Stroke Sister   . Diabetes Brother     Allergies  Allergen Reactions  . Ciprofloxacin   . Inhaler Decongestant  [Methamphetamine]   . Molds & Smuts      Review of Systems  CONSTITUTIONAL: No significant weight changes, fever, chills, weakness or fatigue.  HEENT:  - Eyes: No visual changes.  - Ears: No auditory changes. No pain.  - Nose: No sneezing, congestion, runny nose. - Throat: No sore throat. No changes in swallowing. SKIN: No rash or itching.  CARDIOVASCULAR: No chest pain, chest pressure or chest discomfort. No palpitations or edema.  RESPIRATORY: No shortness of breath, cough or sputum.  GASTROINTESTINAL: No anorexia, nausea, vomiting. No changes in bowel habits. No abdominal pain or blood.  GENITOURINARY: No dysuria. No frequency. No discharge.  NEUROLOGICAL: No headache, dizziness, syncope, paralysis, ataxia, numbness or tingling in the extremities. No memory changes. No change in bowel or bladder control.  MUSCULOSKELETAL: No joint pain. No muscle pain. HEMATOLOGIC: No anemia, bleeding or bruising.  LYMPHATICS: No enlarged lymph nodes.  PSYCHIATRIC: No change in mood. No change in sleep pattern.  ENDOCRINOLOGIC: No reports of sweating, cold or heat intolerance.  No polyuria or polydipsia.     Objective  BP 112/60 mmHg  Pulse 110  Temp(Src) 97.9 F (36.6 C) (Oral)  Resp 18  Ht 5\' 5"  (1.651 m)  Wt 185 lb 8 oz (84.142 kg)  BMI 30.87 kg/m2  SpO2 92% Body mass index is 30.87 kg/(m^2).  Physical Exam  Constitutional: Patient appears well-developed and well-nourished. In no distress. Smells of cigarette smoke as usual. HEENT:  - Head: Normocephalic and atraumatic.  - Ears: Bilateral TMs gray, no erythema or effusion - Nose: Nasal mucosa moist - Mouth/Throat: Oropharynx is clear and moist. No tonsillar hypertrophy or erythema. No post nasal drainage.  - Eyes: Conjunctivae clear, EOM movements normal. PERRLA. No scleral  icterus.  Neck: Normal range of motion. Neck supple. No JVD present. No thyromegaly present.  Cardiovascular: Normal rate, regular rhythm and normal heart sounds.  No murmur heard.  Pulmonary/Chest: Effort normal and breath sounds decreased in all lung fields (baseline). No respiratory distress. Musculoskeletal: Normal range of motion bilateral UE and LE, no joint effusions. Peripheral vascular: Bilateral LE no edema. Neurological: CN II-XII grossly intact with no focal deficits. Alert and oriented to person, place, and time. Coordination, balance, strength, speech and gait are normal.  Skin: Skin is warm and dry. No rash noted. No erythema.  Psychiatric: Patient has a stable mood and affect. Shaking a little more than usual. Behavior is normal in office today. Judgment and thought content normal in office today.   Recent Results (from the past 2160 hour(s))  POCT glucose (manual entry)     Status: Abnormal   Collection Time: 01/22/15  3:14 PM  Result Value Ref Range   POC Glucose 200 (A) 70 - 99 mg/dl    Comment: provider informed  POCT glycosylated hemoglobin (Hb A1C)     Status: Abnormal   Collection Time: 02/21/15  9:58 AM  Result Value Ref Range   Hemoglobin A1C 7.4     Assessment & Plan  1. Diabetes mellitus type 2, controlled, with complications  Patient's Hba1c goal is <8% as this is reasonable for the elderly population who is at risk for hypoglycemic events. Reassured patient that her Hba1c of 7.4% is at goal and we need to make no changes to her current regimen.  Encouraged patient to continue efforts on checking blood glucose on a daily basis. Fasting blood glucose control goal is 80-130mg /dL and post prandial blood glucose control is 180mg /dL.  Reviewed diet, exercise, lifestyle changes and current medication regimen pertaining to diabetes with the patient.   Reminded patient of the required annual dilated retinal exam.     - POCT glycosylated hemoglobin (Hb  A1C)   2. Need for immunization against influenza  - Flu vaccine HIGH DOSE PF (Fluzone High dose)  3. Hypertension goal BP (blood pressure) < 140/90 Clinically stable findings based on clinical exam and on review of any pertinent results. Recommended to patient that they continue their current regimen with regular follow ups.   4. Anxiety and depression Self discontinued Celexa 20 mg one a day. Stable.

## 2015-03-27 ENCOUNTER — Other Ambulatory Visit: Payer: Self-pay | Admitting: Family Medicine

## 2015-04-02 ENCOUNTER — Other Ambulatory Visit: Payer: Self-pay | Admitting: Family Medicine

## 2015-04-06 ENCOUNTER — Other Ambulatory Visit: Payer: Self-pay | Admitting: Family Medicine

## 2015-04-07 ENCOUNTER — Other Ambulatory Visit: Payer: Self-pay | Admitting: Family Medicine

## 2015-04-09 ENCOUNTER — Ambulatory Visit: Payer: Medicare Other | Admitting: Family Medicine

## 2015-04-09 ENCOUNTER — Ambulatory Visit (INDEPENDENT_AMBULATORY_CARE_PROVIDER_SITE_OTHER): Payer: Medicare Other | Admitting: Family Medicine

## 2015-04-09 ENCOUNTER — Encounter: Payer: Self-pay | Admitting: Family Medicine

## 2015-04-09 VITALS — BP 110/64 | HR 101 | Temp 98.2°F | Resp 16 | Wt 186.0 lb

## 2015-04-09 DIAGNOSIS — J309 Allergic rhinitis, unspecified: Secondary | ICD-10-CM | POA: Insufficient documentation

## 2015-04-09 DIAGNOSIS — J45909 Unspecified asthma, uncomplicated: Secondary | ICD-10-CM

## 2015-04-09 DIAGNOSIS — R0982 Postnasal drip: Secondary | ICD-10-CM

## 2015-04-09 DIAGNOSIS — E118 Type 2 diabetes mellitus with unspecified complications: Secondary | ICD-10-CM | POA: Diagnosis not present

## 2015-04-09 DIAGNOSIS — K5901 Slow transit constipation: Secondary | ICD-10-CM | POA: Diagnosis not present

## 2015-04-09 DIAGNOSIS — J449 Chronic obstructive pulmonary disease, unspecified: Secondary | ICD-10-CM

## 2015-04-09 MED ORDER — FLUTICASONE PROPIONATE 50 MCG/ACT NA SUSP: 2.0000 | Freq: Every day | NASAL | Status: DC

## 2015-04-09 MED ORDER — ALBUTEROL SULFATE HFA 108 (90 BASE) MCG/ACT IN AERS: 2.0000 | INHALATION_SPRAY | RESPIRATORY_TRACT | Status: DC | PRN

## 2015-04-09 NOTE — Progress Notes (Signed)
Name: Jocelyn Burgess   MRN: [id number]    DOB: [birth year]   Date:04/09/2015       Progress Note  Subjective  Chief Complaint  Chief Complaint  Patient presents with  . Follow-up    patient is here for a 25-month follow-up  . Medication Management    metformin 500mg  or 1000mg     HPI  Here today with husband to clarify medication use. Pharmacy dispensed Metformin 500 mg po bid and glimepiride 2 mg po bid.  But they were under the impression she was supposed to be on 1000 mg po bid and glimepiride 4 mg po bid. Sugars remain stable at range 130-150.   COPD using 2 L oxygen at night, coughing only at night. Only starts after she puts oxygen on (has humidifier attached). Using theophylline 400 mg ER one a day. Questions whether she should be on more. On reviewing previous medical records she was on Theophylline 200 mg po bid.   Has constipation, can go for almost 1 week without a bowel movement. Is going to try sugar free milk of magnesia.   Past Medical History  Diagnosis Date  . Allergy   . Cancer ([facility])   . Diabetes mellitus without complication ([place])   . Arthritis   . Asthma   . Cataract   . COPD (chronic obstructive pulmonary disease) ([place])   . Depression     sometimes  . Hyperlipidemia   . Hypertension   . Oxygen deficiency     Patient Active Problem List   Diagnosis Date Noted  . Need for immunization against influenza 02/21/2015  . Hypertension goal BP (blood pressure) < 140/90 01/22/2015  . Acute antritis 11/23/2014  . Allergic rhinitis 11/23/2014  . Anxiety and depression 11/23/2014  . AB (asthmatic bronchitis) 11/23/2014  . Bronchitis with chronic airway obstruction ([place]) 11/23/2014  . Foot pain 11/23/2014  . Cigarette smoker 11/23/2014  . COPD, severe ([place]) 11/23/2014  . DD (diverticular disease) 11/23/2014  . Chronic obstructive pulmonary emphysema ([place]) 11/23/2014  . Personal history of malignant neoplasm of breast 11/23/2014  . History of digestive  disease 11/23/2014  . Personal history of healed traumatic fracture 11/23/2014  . HLD (hyperlipidemia) 11/23/2014  . Peripheral neuropathic pain ([place]) 11/23/2014  . At risk for falling 11/23/2014  . Diabetes mellitus type 2, controlled, with complications ([place]) 79/07/4095    Social History  Substance Use Topics  . Smoking status: Current Every Day Smoker    Types: Cigarettes  . Smokeless tobacco: Current User  . Alcohol Use: No     Current outpatient prescriptions:  .  aspirin 81 MG tablet, Take by mouth., Disp: , Rfl:  .  citalopram (CELEXA) 20 MG tablet, TAKE 1 TABLET (20 MG TOTAL) BY MOUTH DAILY., Disp: , Rfl: 3 .  cloNIDine (CATAPRES) 0.1 MG tablet, TAKE 1 TABLET BY MOUTH TWICE A DAY, Disp: 180 tablet, Rfl: 3 .  diazepam (VALIUM) 5 MG tablet, TAKE 1 TABLET BY MOUTH TWICE A DAY, Disp: 180 tablet, Rfl: 1 .  gabapentin (NEURONTIN) 300 MG capsule, Take by mouth., Disp: , Rfl:  .  gemfibrozil (LOPID) 600 MG tablet, TAKE 1 TABLET BY MOUTH TWICE A DAY, Disp: 180 tablet, Rfl: 3 .  glimepiride (AMARYL) 2 MG tablet, TAKE 1 TABLET BY MOUTH TWICE A DAY, Disp: 180 tablet, Rfl: 2 .  glucose blood (ACCU-CHEK AVIVA PLUS) test strip, Use as instructed to check blood sugar 3x/day, Disp: 100 each, Rfl: 5 .  hydrochlorothiazide (HYDRODIURIL) 25 MG  tablet, TAKE 1 TABLET BY MOUTH DAILY, Disp: 90 tablet, Rfl: 2 .  HYDROcodone-acetaminophen (NORCO) 5-325 MG per tablet, Take by mouth., Disp: , Rfl:  .  loratadine (CLARITIN) 10 MG tablet, Take by mouth., Disp: , Rfl:  .  lovastatin (MEVACOR) 20 MG tablet, TAKE 1 TABLET BY MOUTH EVERY DAY WITH EVENING MEAL, Disp: 90 tablet, Rfl: 1 .  meclizine (ANTIVERT) 25 MG tablet, TAKE 1 TABLET BY MOUTH EVERY 8 HOURS AS NEEDED, Disp: 200 tablet, Rfl: 1 .  meloxicam (MOBIC) 15 MG tablet, Take by mouth., Disp: , Rfl:  .  metFORMIN (GLUCOPHAGE) 1000 MG tablet, Take 1 tablet (1,000 mg total) by mouth 2 (two) times daily with a meal., Disp: 180 tablet, Rfl: 3 .  montelukast  (SINGULAIR) 10 MG tablet, TAKE 1 TABLET BY MOUTH EVERY EVENING, Disp: 90 tablet, Rfl: 3 .  predniSONE (DELTASONE) 5 MG tablet, Take by mouth., Disp: , Rfl:  .  theophylline (THEO-24) 400 MG 24 hr capsule, Take 1 capsule (400 mg total) by mouth daily., Disp: 90 capsule, Rfl: 3 .  tiotropium (SPIRIVA HANDIHALER) 18 MCG inhalation capsule, Place into inhaler and inhale., Disp: , Rfl:  .  VENTOLIN HFA 108 (90 BASE) MCG/ACT inhaler, INHALE 2 PUFFS BY MOUTH EVERY 4 HOURS AS NEEDED, Disp: 18 g, Rfl: 5  Allergies  Allergen Reactions  . Ciprofloxacin   . Inhaler Decongestant  [Methamphetamine]   . Molds & Smuts     Review of Systems  CONSTITUTIONAL: No significant weight changes, fever, chills, weakness or fatigue.  HEENT:  - Eyes: No visual changes.  - Ears: No auditory changes. No pain.  - Nose: Yes sneezing, congestion. - Throat: No sore throat. No changes in swallowing. SKIN: No rash or itching.  CARDIOVASCULAR: No chest pain, chest pressure or chest discomfort. No palpitations or edema.  RESPIRATORY: Stable shortness of breath. Yes cough at night. GASTROINTESTINAL: No anorexia, nausea, vomiting. Yes constipation. NEUROLOGICAL: No headache, dizziness, syncope, paralysis, ataxia, numbness or tingling in the extremities. No memory changes. No change in bowel or bladder control.  MUSCULOSKELETAL: No joint pain. No muscle pain. HEMATOLOGIC: No anemia, bleeding or bruising.  LYMPHATICS: No enlarged lymph nodes.  PSYCHIATRIC: No change in mood. No change in sleep pattern.  ENDOCRINOLOGIC: No reports of sweating, cold or heat intolerance. No polyuria or polydipsia.      Objective  BP 110/64 mmHg  Pulse 101  Temp(Src) 98.2 F (36.8 C) (Oral)  Resp 16  Wt 186 lb (84.369 kg)  SpO2 93%  Body mass index is 30.95 kg/(m^2).   Physical Exam  Constitutional: Patient appears well-developed and well-nourished. In no distress.  HEENT:  - Head: Normocephalic and atraumatic.  - Ears:  Bilateral TMs gray, no erythema or effusion - Nose: Nasal mucosa moist - Mouth/Throat: Oropharynx is clear and moist. No tonsillar hypertrophy or erythema. Some post nasal drainage.  Cardiovascular: Normal rate, regular rhythm and normal heart sounds.  No murmur heard.  Pulmonary/Chest: Effort normal and breath sounds baseline decreased in all lung fields. No respiratory distress. Skin: Skin is warm and dry. No rash noted. No erythema.  Psychiatric: Patient has a normal mood and affect. Behavior is normal in office today. Judgment and thought content normal in office today.    Assessment & Plan  1. Controlled type 2 diabetes mellitus with complication, without long-term current use of insulin (HCC) Clarified that she should be on Metformin 1000 mg po bid and decided on keeping lower dose of glimepiride 2 mg po  bid.   2. COPD, severe (HCC) Stable. Decided to take 1/2 tablets of Theophyline 400mg  ER twice a day. Not on prednisone any more. Ventolin refilled.   3. Constipation, slow transit Start milk of magnesia.   4. Allergic rhinitis with postnasal drip Should help with nasal congestion and post nasal drainage  - fluticasone (FLONASE) 50 MCG/ACT nasal spray; Place 2 sprays into both nostrils daily.  Dispense: 16 g; Refill: 6

## 2015-04-16 ENCOUNTER — Telehealth: Payer: Self-pay

## 2015-04-16 NOTE — Telephone Encounter (Signed)
Patient's husband called stating that she has been having several of the listed several side effects to the medication she was recently placed on. He stated that she has been irritable, confused, dizzy at times along with her not being able to hold a cup of coffee due to weakness. I then encouraged him to go the the ER, but as he mentioned to [facility], "they can't do nothing for [place] there,. I think it's the medication." I told him that I would consult with Dr. Nadine Counts, but that he should call 79 if her sx continues or worsens before I can call him back.

## 2015-04-17 ENCOUNTER — Ambulatory Visit
Admission: AD | Admit: 2015-04-17 | Discharge: 2015-04-17 | Disposition: A | Discharge status: Home / Self Care | Payer: Medicare Other | Source: Ambulatory Visit | Attending: Family Medicine | Admitting: Family Medicine

## 2015-04-17 ENCOUNTER — Ambulatory Visit
Admission: RE | Admit: 2015-04-17 | Discharge: 2015-04-17 | Disposition: A | Discharge status: Home / Self Care | Payer: Medicare Other | Source: Ambulatory Visit | Attending: Family Medicine | Admitting: Family Medicine

## 2015-04-17 ENCOUNTER — Ambulatory Visit (INDEPENDENT_AMBULATORY_CARE_PROVIDER_SITE_OTHER): Payer: Medicare Other | Admitting: Family Medicine

## 2015-04-17 ENCOUNTER — Encounter: Payer: Self-pay | Admitting: Family Medicine

## 2015-04-17 VITALS — BP 142/80 | HR 90 | Temp 97.6°F | Resp 12 | Wt 184.6 lb

## 2015-04-17 DIAGNOSIS — R05 Cough: Secondary | ICD-10-CM

## 2015-04-17 DIAGNOSIS — J439 Emphysema, unspecified: Secondary | ICD-10-CM | POA: Insufficient documentation

## 2015-04-17 DIAGNOSIS — R059 Cough, unspecified: Secondary | ICD-10-CM

## 2015-04-17 DIAGNOSIS — R410 Disorientation, unspecified: Secondary | ICD-10-CM | POA: Diagnosis present

## 2015-04-17 DIAGNOSIS — J449 Chronic obstructive pulmonary disease, unspecified: Secondary | ICD-10-CM | POA: Diagnosis not present

## 2015-04-17 DIAGNOSIS — J441 Chronic obstructive pulmonary disease with (acute) exacerbation: Secondary | ICD-10-CM

## 2015-04-17 LAB — CBC WITH DIFFERENTIAL/PLATELET
BASOS: 1 %
Basophils Absolute: 0 10*3/uL (ref 0.0–0.2)
EOS (ABSOLUTE): 0.2 10*3/uL (ref 0.0–0.4)
Eos: 2 %
HEMATOCRIT: 37.6 % (ref 34.0–46.6)
HEMOGLOBIN: 12.2 g/dL (ref 11.1–15.9)
IMMATURE GRANS (ABS): 0.1 10*3/uL (ref 0.0–0.1)
Immature Granulocytes: 1 %
LYMPHS: 32 %
Lymphocytes Absolute: 2.3 10*3/uL (ref 0.7–3.1)
MCH: 27.9 pg (ref 26.6–33.0)
MCHC: 32.4 g/dL (ref 31.5–35.7)
MCV: 86 fL (ref 79–97)
MONOS ABS: 0.8 10*3/uL (ref 0.1–0.9)
Monocytes: 10 %
NEUTROS ABS: 3.9 10*3/uL (ref 1.4–7.0)
NRBC: 0 % (ref 0–0)
Neutrophils: 54 %
Platelets: 336 10*3/uL (ref 150–379)
RBC: 4.38 x10E6/uL (ref 3.77–5.28)
RDW: 14.5 % (ref 12.3–15.4)
WBC: 7.3 10*3/uL (ref 3.4–10.8)

## 2015-04-17 LAB — POCT URINALYSIS DIPSTICK
Bilirubin, UA: NEGATIVE
Blood, UA: NEGATIVE
Glucose, UA: NEGATIVE
KETONES UA: NEGATIVE
Nitrite, UA: NEGATIVE
PH UA: 6
SPEC GRAV UA: 1.015
UROBILINOGEN UA: 0.2

## 2015-04-17 LAB — COMPREHENSIVE METABOLIC PANEL
A/G RATIO: 2 (ref 1.1–2.5)
ALBUMIN: 4.7 g/dL (ref 3.5–4.7)
ALT: 21 IU/L (ref 0–32)
AST: 18 IU/L (ref 0–40)
Alkaline Phosphatase: 60 IU/L (ref 39–117)
BUN / CREAT RATIO: 19 (ref 11–26)
BUN: 9 mg/dL (ref 8–27)
Bilirubin Total: 0.3 mg/dL (ref 0.0–1.2)
CALCIUM: 9.8 mg/dL (ref 8.7–10.3)
CHLORIDE: 91 mmol/L — AB (ref 97–106)
CO2: 26 mmol/L (ref 18–29)
Creatinine, Ser: 0.47 mg/dL — ABNORMAL LOW (ref 0.57–1.00)
GFR calc Af Amer: 107 mL/min/{1.73_m2} (ref >59)
GFR calc non Af Amer: 93 mL/min/{1.73_m2} (ref >59)
GLOBULIN, TOTAL: 2.3 g/dL (ref 1.5–4.5)
Glucose: 170 mg/dL — ABNORMAL HIGH (ref 65–99)
Potassium: 4 mmol/L (ref 3.5–5.2)
SODIUM: 132 mmol/L — AB (ref 136–144)
TOTAL PROTEIN: 7 g/dL (ref 6.0–8.5)

## 2015-04-17 MED ORDER — PREDNISONE 20 MG PO TABS: ORAL_TABLET | ORAL | Status: DC

## 2015-04-17 MED ORDER — AZITHROMYCIN 500 MG PO TABS: 500.0000 mg | ORAL_TABLET | Freq: Every day | ORAL | Status: DC

## 2015-04-17 NOTE — Patient Instructions (Signed)
GO TO EMERGENCY ROOM IF SYMPTOMS ARE GETTING WORSE.

## 2015-04-17 NOTE — Telephone Encounter (Signed)
Seen in office today  

## 2015-04-17 NOTE — Progress Notes (Signed)
Name: Jocelyn Burgess   MRN: [id number]    DOB: 1932/06/02   Date:04/17/2015       Progress Note  Subjective  Chief Complaint  Chief Complaint  Patient presents with  . Shortness of Breath  . Dizziness  . Tremors  . Sinusitis    patient stated that she thinks she has a sinus headache & some has some congestion.    HPI  Jocelyn Burgess is a 81 year old female who is accompanied by her husband today with concerns of shaking, dizziness, drowsiness, disoriented at night and early morning, tired, irritable, visual hallucinations, some ankle swelling. Onset 2 weeks ago gradually. No fevers. Reports coughing and wheezing especially at night time. No dysuria, frequency or pelvic pain. Denies syncope. Mr. Lecker has stopped her Gabapentin 300 mg (usually takes 1 am and 2 pm) after yesterday morning's dose but symptoms still the same. He reports that Jocelyn Burgess wakes up in the middle of the night and states it is time to make coffee, it helps with her wheezing at night.   Past Medical History  Diagnosis Date  . Allergy   . Cancer ([place])   . Diabetes mellitus without complication ([place])   . Arthritis   . Asthma   . Cataract   . COPD (chronic obstructive pulmonary disease) ([place])   . Depression     sometimes  . Hyperlipidemia   . Hypertension   . Oxygen deficiency     Patient Active Problem List   Diagnosis Date Noted  . Constipation, slow transit 04/09/2015  . Allergic rhinitis with postnasal drip 04/09/2015  . Need for immunization against influenza 02/21/2015  . Hypertension goal BP (blood pressure) < 140/90 01/22/2015  . Acute antritis 11/23/2014  . Allergic rhinitis 11/23/2014  . Anxiety and depression 11/23/2014  . AB (asthmatic bronchitis) 11/23/2014  . Bronchitis with chronic airway obstruction ([place]) 11/23/2014  . Foot pain 11/23/2014  . Cigarette smoker 11/23/2014  . COPD, severe ([place]) 11/23/2014  . DD (diverticular disease) 11/23/2014  . 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 Personal history of malignant neoplasm of breast 11/23/2014  . History of digestive disease 11/23/2014  . Personal history of healed traumatic fracture 11/23/2014  . HLD (hyperlipidemia) 11/23/2014  . Peripheral neuropathic pain ([place]) 11/23/2014  . At risk for falling 11/23/2014  . Diabetes mellitus type 2, controlled, with complications ([place]) 70/35/0093    Social History  Substance Use Topics  . Smoking status: Current Every Day Smoker    Types: Cigarettes  . Smokeless tobacco: Current User  . Alcohol Use: No     Current outpatient prescriptions:  .  albuterol (VENTOLIN HFA) 108 (90 BASE) MCG/ACT inhaler, Inhale 2 puffs into the lungs every 4 (four) hours as needed., Disp: 3 Inhaler, Rfl: 3 .  aspirin 81 MG tablet, Take by mouth., Disp: , Rfl:  .  citalopram (CELEXA) 20 MG tablet, TAKE 1 TABLET (20 MG TOTAL) BY MOUTH DAILY., Disp: , Rfl: 3 .  cloNIDine (CATAPRES) 0.1 MG tablet, TAKE 1 TABLET BY MOUTH TWICE A DAY, Disp: 180 tablet, Rfl: 3 .  diazepam (VALIUM) 5 MG tablet, TAKE 1 TABLET BY MOUTH TWICE A DAY, Disp: 180 tablet, Rfl: 1 .  fluticasone (FLONASE) 50 MCG/ACT nasal spray, Place 2 sprays into both nostrils daily., Disp: 16 g, Rfl: 6 .  gabapentin (NEURONTIN) 300 MG capsule, TAKE 1 CAPSULE BY MOUTH 3 TIMES A DAY (Patient not taking: Reported on 04/17/2015), Disp: 270 capsule, Rfl: 3 .  gemfibrozil (LOPID) 600 MG tablet, TAKE 1 TABLET BY MOUTH TWICE A DAY, Disp: 180 tablet, Rfl: 3 .  glimepiride (AMARYL) 2 MG tablet, TAKE 1 TABLET BY MOUTH TWICE A DAY, Disp: 180 tablet, Rfl: 2 .  glucose blood (ACCU-CHEK AVIVA PLUS) test strip, Use as instructed to check blood sugar 3x/day, Disp: 100 each, Rfl: 5 .  hydrochlorothiazide (HYDRODIURIL) 25 MG tablet, TAKE 1 TABLET BY MOUTH DAILY, Disp: 90 tablet, Rfl: 2 .  HYDROcodone-acetaminophen (NORCO) 5-325 MG per tablet, Take by mouth., Disp: , Rfl:  .  loratadine (CLARITIN) 10 MG tablet, Take by mouth., Disp: ,  Rfl:  .  lovastatin (MEVACOR) 20 MG tablet, TAKE 1 TABLET BY MOUTH EVERY DAY WITH EVENING MEAL, Disp: 90 tablet, Rfl: 1 .  magnesium hydroxide (MILK OF MAGNESIA) 400 MG/5ML suspension, Take 5 mLs by mouth daily as needed., Disp: , Rfl:  .  meclizine (ANTIVERT) 25 MG tablet, TAKE 1 TABLET BY MOUTH EVERY 8 HOURS AS NEEDED, Disp: 200 tablet, Rfl: 1 .  meloxicam (MOBIC) 15 MG tablet, Take by mouth., Disp: , Rfl:  .  metFORMIN (GLUCOPHAGE) 1000 MG tablet, Take 1 tablet (1,000 mg total) by mouth 2 (two) times daily with a meal., Disp: 180 tablet, Rfl: 3 .  montelukast (SINGULAIR) 10 MG tablet, TAKE 1 TABLET BY MOUTH EVERY EVENING, Disp: 90 tablet, Rfl: 3 .  theophylline (UNIPHYL) 400 MG 24 hr tablet, Take 200 mg by mouth 2 (two) times daily., Disp: , Rfl:  .  tiotropium (SPIRIVA HANDIHALER) 18 MCG inhalation capsule, Place into inhaler and inhale., Disp: , Rfl:   Past Surgical History  Procedure Laterality Date  . Breast surgery Left     Family History  Problem Relation Age of Onset  . Arthritis Mother   . Arthritis Father   . Cancer Sister     brain  . Heart disease Sister   . Stroke Sister   . Diabetes Brother     Allergies  Allergen Reactions  . Ciprofloxacin   . Inhaler Decongestant  [Methamphetamine]   . Molds & Smuts      Review of Systems  CONSTITUTIONAL: No significant weight changes, fever, chills. Yes weakness and fatigue.  HEENT:  - Eyes: No visual changes.  - Ears: No auditory changes. No pain.  - Nose: Yes congestion, runny nose. - Throat: No sore throat. No changes in swallowing. SKIN: No rash or itching.  CARDIOVASCULAR: No chest pain, chest pressure or chest discomfort. No palpitations or edema.  RESPIRATORY: Yes shortness of breath, cough or sputum.  GASTROINTESTINAL: No anorexia, nausea, vomiting. No changes in bowel habits. No abdominal pain or blood.  GENITOURINARY: No dysuria. No frequency. No discharge.  NEUROLOGICAL: No headache. Yes dizziness. No  syncope, paralysis, ataxia, numbness or tingling in the extremities. Yes memory changes. No change in bowel or bladder control.   LYMPHATICS: No enlarged lymph nodes.  PSYCHIATRIC: Yes change in mood. Yes change in sleep pattern.     Objective  Blood pressure 142/80, pulse 90, temperature 97.6 F (36.4 C), temperature source Oral, resp. rate 12, weight 184 lb 9.6 oz (83.734 kg), SpO2 92 %.   Physical Exam  Constitutional: Patient appears well-developed and well-nourished. In no distress. Smells of cigarette smoke. HEENT:  - Head: Normocephalic and atraumatic.  - Ears: Bilateral TMs gray, no erythema or effusion - Nose: Nasal mucosa moist, boggy, congested. - Mouth/Throat: Oropharynx is clear and moist. No tonsillar hypertrophy or erythema. Yes post nasal drainage.  - Eyes: Conjunctivae  clear, EOM movements normal. PERRLA. No scleral icterus.  Neck: Normal range of motion. Neck supple. No JVD present. No thyromegaly present.  Cardiovascular: Normal rate, regular rhythm and normal heart sounds.  No murmur heard.  Pulmonary/Chest: Effort normal with scattered rhonchi and wheezing. No respiratory distress. Musculoskeletal: Normal range of motion bilateral UE and LE, no joint effusions. Peripheral vascular: Bilateral LE trace pedal edema. Neurological: CN II-XII grossly intact with no focal deficits. Alert and oriented to person, place, and time. Coordination, balance, strength, speech and gait are normal but she does have resting general tremor.  Skin: Skin is warm and dry. No rash noted. No erythema.  Psychiatric: Patient has a stable mood and affect. Behavior is normal in office today. Judgment and thought content normal in office today.   Recent Results (from the past 2160 hour(s))  POCT glucose (manual entry)     Status: Abnormal   Collection Time: 01/22/15  3:14 PM  Result Value Ref Range   POC Glucose 200 (A) 70 - 99 mg/dl    Comment: provider informed  POCT glycosylated  hemoglobin (Hb A1C)     Status: Abnormal   Collection Time: 02/21/15  9:58 AM  Result Value Ref Range   Hemoglobin A1C 7.4     Results for orders placed or performed in visit on 04/17/15 (from the past 24 hour(s))  POCT Urinalysis Dipstick     Status: Abnormal   Collection Time: 04/17/15 10:45 AM  Result Value Ref Range   Color, UA yellow    Clarity, UA clear    Glucose, UA negative    Bilirubin, UA negative    Ketones, UA negative    Spec Grav, UA 1.015    Blood, UA negative    pH, UA 6.0    Protein, UA trace    Urobilinogen, UA 0.2    Nitrite, UA negative    Leukocytes, UA moderate (2+) (A) Negative   Assessment & Plan  1. COPD with acute exacerbation ([place]) Likely acute COPD exacerbation with poor oxygenation suspected over night causing gradual confusion.  Plan to treat with high dose azithromycin due to cipro allergy and prednisone.  - azithromycin (ZITHROMAX) 500 MG tablet; Take 1 tablet (500 mg total) by mouth daily.  Dispense: 10 tablet; Refill: 0 - predniSONE (DELTASONE) 20 MG tablet; One tab po bid for 3 days then one tab po qday for 3 more days.  Dispense: 9 tablet; Refill: 0  2. Bronchitis with chronic airway obstruction ([facility]) Rule out pneumonia with CXR.  - azithromycin (ZITHROMAX) 500 MG tablet; Take 1 tablet (500 mg total) by mouth daily.  Dispense: 10 tablet; Refill: 0 - predniSONE (DELTASONE) 20 MG tablet; One tab po bid for 3 days then one tab po qday for 3 more days.  Dispense: 9 tablet; Refill: 0  3. Intermittent confusion Blood work.  - CBC with Differential/Platelet - Comprehensive metabolic panel - DG Chest 2 View; Future - POCT Urinalysis Dipstick - Urine culture  4. Cough Due to COPD exacerbation.  - CBC with Differential/Platelet - Comprehensive metabolic panel - DG Chest 2 View; Future - azithromycin (ZITHROMAX) 500 MG tablet; Take 1 tablet (500 mg total) by mouth daily.  Dispense: 10 tablet; Refill: 0 - predniSONE (DELTASONE) 20 MG  tablet; One tab po bid for 3 days then one tab po qday for 3 more days.  Dispense: 9 tablet; Refill: 0

## 2015-04-19 LAB — URINE CULTURE

## 2015-04-23 ENCOUNTER — Telehealth: Payer: Self-pay | Admitting: Family Medicine

## 2015-04-23 NOTE — Telephone Encounter (Signed)
Legrand Como (husband) requesting return call. Patient was prescribed a six day supply of Prednisone and wanted to know if she will need another round. Please return call to discuss this [phone]

## 2015-04-24 NOTE — Telephone Encounter (Signed)
I returned this patient's husband's call and asked how she was doing. He stated that she was doing just fine, so I told him that she did not need another round of prednisone but if her s

## 2015-04-24 NOTE — Telephone Encounter (Signed)
As I was stated before,  This patient's husband was informed that if her symptoms change or happens to get worse she is to give [place] a call. He agreed and said thanks.

## 2015-05-01 ENCOUNTER — Ambulatory Visit (INDEPENDENT_AMBULATORY_CARE_PROVIDER_SITE_OTHER): Payer: Medicare Other | Admitting: Family Medicine

## 2015-05-01 ENCOUNTER — Other Ambulatory Visit: Payer: Self-pay

## 2015-05-01 ENCOUNTER — Encounter: Payer: Self-pay | Admitting: Family Medicine

## 2015-05-01 VITALS — BP 122/68 | HR 96 | Temp 97.5°F | Resp 18 | Ht 65.0 in | Wt 183.6 lb

## 2015-05-01 DIAGNOSIS — J441 Chronic obstructive pulmonary disease with (acute) exacerbation: Secondary | ICD-10-CM

## 2015-05-01 DIAGNOSIS — R519 Headache, unspecified: Secondary | ICD-10-CM | POA: Insufficient documentation

## 2015-05-01 DIAGNOSIS — K5901 Slow transit constipation: Secondary | ICD-10-CM | POA: Diagnosis not present

## 2015-05-01 DIAGNOSIS — R51 Headache: Secondary | ICD-10-CM | POA: Diagnosis not present

## 2015-05-01 MED ORDER — LINACLOTIDE 145 MCG PO CAPS: 145.0000 ug | ORAL_CAPSULE | Freq: Every day | ORAL | Status: DC

## 2015-05-01 MED ORDER — POLYETHYLENE GLYCOL 3350 17 GM/SCOOP PO POWD: 17.0000 g | Freq: Every day | ORAL | Status: DC

## 2015-05-01 MED ORDER — MELOXICAM 15 MG PO TABS: 15.0000 mg | ORAL_TABLET | Freq: Every day | ORAL | Status: DC

## 2015-05-01 NOTE — Telephone Encounter (Signed)
Patient want to try this before trying the Linzess due to finances.  Refill request was sent to Dr. Bobetta Lime for approval and submission.

## 2015-05-01 NOTE — Progress Notes (Signed)
Name: Jocelyn Burgess   MRN: [id number]    DOB: 07/29/1931   Date:05/01/2015       Progress Note  Subjective  Chief Complaint  Chief Complaint  Patient presents with  . Follow-up    COPD with acute exacerbation  . Constipation    patient is interested in trying Linzess.    HPI  [name] is a 81 year old female who is accompanied by her husband today for follow up since about 2 weeks ago she had concerns of shaking, dizziness, drowsiness, disoriented at night and early morning, tired, irritable, visual hallucinations, some ankle swelling. No fevers. Reports coughing and wheezing especially at night time. No dysuria, frequency or pelvic pain. Denied syncope. She was treated as acute COPD exacerbation and treated with Azithromycin and Prednisone. Today reports doing better. Using [place] oxygen 3 L at night and occasionally during the day but rarely. Confusion resolved. Having morning headaches. Would like to go back on Mobic, she was on it in the past and it helped. Also wants to try Linzess for chronic constipation. Metamucil, Miralax, docusate OTC not helpful or she is intolerant to taste. Having BMs every 3 days on average. Well formed stools. No rectal bleeding.  Past Medical History  Diagnosis Date  . Allergy   . Cancer ([place])   . Diabetes mellitus without complication ([place])   . Arthritis   . Asthma   . Cataract   . COPD (chronic obstructive pulmonary disease) ([place])   . Depression     sometimes  . Hyperlipidemia   . Hypertension   . Oxygen deficiency     Patient Active Problem List   Diagnosis Date Noted  . Intermittent confusion 04/17/2015  . Cough 04/17/2015  . Constipation, slow transit 04/09/2015  . Allergic rhinitis with postnasal drip 04/09/2015  . Need for immunization against influenza 02/21/2015  . Hypertension goal BP (blood pressure) < 140/90 01/22/2015  . Acute antritis 11/23/2014  . Allergic rhinitis 11/23/2014  . Anxiety and depression 11/23/2014  . AB  (asthmatic bronchitis) 11/23/2014  . Bronchitis with chronic airway obstruction ([place]) 11/23/2014  . Foot pain 11/23/2014  . Cigarette smoker 11/23/2014  . COPD, severe ([place]) 11/23/2014  . DD (diverticular disease) 11/23/2014  . COPD with acute exacerbation ([place]) 11/23/2014  . Personal history of malignant neoplasm of breast 11/23/2014  . History of digestive disease 11/23/2014  . Personal history of healed traumatic fracture 11/23/2014  . HLD (hyperlipidemia) 11/23/2014  . Peripheral neuropathic pain ([place]) 11/23/2014  . At risk for falling 11/23/2014  . Diabetes mellitus type 2, controlled, with complications ([place]) [id number]    Social History  Substance Use Topics  . Smoking status: Current Every Day Smoker    Types: Cigarettes  . Smokeless tobacco: Current User  . Alcohol Use: No     Current outpatient prescriptions:  .  albuterol (VENTOLIN HFA) 108 (90 BASE) MCG/ACT inhaler, Inhale 2 puffs into the lungs every 4 (four) hours as needed., Disp: 3 Inhaler, Rfl: 3 .  aspirin 81 MG tablet, Take by mouth., Disp: , Rfl:  .  citalopram (CELEXA) 20 MG tablet, TAKE 1 TABLET (20 MG TOTAL) BY MOUTH DAILY., Disp: , Rfl: 3 .  cloNIDine (CATAPRES) 0.1 MG tablet, TAKE 1 TABLET BY MOUTH TWICE A DAY, Disp: 180 tablet, Rfl: 3 .  diazepam (VALIUM) 5 MG tablet, TAKE 1 TABLET BY MOUTH TWICE A DAY, Disp: 180 tablet, Rfl: 1 .  fluticasone (FLONASE) 50 MCG/ACT nasal spray, Place 2 sprays into  both nostrils daily., Disp: 16 g, Rfl: 6 .  gabapentin (NEURONTIN) 300 MG capsule, TAKE 1 CAPSULE BY MOUTH 3 TIMES A DAY (Patient not taking: Reported on 04/17/2015), Disp: 270 capsule, Rfl: 3 .  gemfibrozil (LOPID) 600 MG tablet, TAKE 1 TABLET BY MOUTH TWICE A DAY, Disp: 180 tablet, Rfl: 3 .  glimepiride (AMARYL) 4 MG tablet, , Disp: , Rfl:  .  glucose blood (ACCU-CHEK AVIVA PLUS) test strip, Use as instructed to check blood sugar 3x/day, Disp: 100 each, Rfl: 5 .  hydrochlorothiazide (HYDRODIURIL) 25 MG tablet,  TAKE 1 TABLET BY MOUTH DAILY, Disp: 90 tablet, Rfl: 2 .  HYDROcodone-acetaminophen (NORCO) 5-325 MG per tablet, Take by mouth., Disp: , Rfl:  .  loratadine (CLARITIN) 10 MG tablet, Take by mouth., Disp: , Rfl:  .  lovastatin (MEVACOR) 20 MG tablet, TAKE 1 TABLET BY MOUTH EVERY DAY WITH EVENING MEAL, Disp: 90 tablet, Rfl: 1 .  magnesium hydroxide (MILK OF MAGNESIA) 400 MG/5ML suspension, Take 5 mLs by mouth daily as needed., Disp: , Rfl:  .  meclizine (ANTIVERT) 25 MG tablet, TAKE 1 TABLET BY MOUTH EVERY 8 HOURS AS NEEDED, Disp: 200 tablet, Rfl: 1 .  meloxicam (MOBIC) 15 MG tablet, Take by mouth., Disp: , Rfl:  .  metFORMIN (GLUCOPHAGE) 1000 MG tablet, Take 1 tablet (1,000 mg total) by mouth 2 (two) times daily with a meal., Disp: 180 tablet, Rfl: 3 .  montelukast (SINGULAIR) 10 MG tablet, TAKE 1 TABLET BY MOUTH EVERY EVENING, Disp: 90 tablet, Rfl: 3 .  predniSONE (DELTASONE) 20 MG tablet, One tab po bid for 3 days then one tab po qday for 3 more days., Disp: 9 tablet, Rfl: 0 .  theophylline (UNIPHYL) 400 MG 24 hr tablet, Take 200 mg by mouth 2 (two) times daily., Disp: , Rfl:  .  tiotropium (SPIRIVA HANDIHALER) 18 MCG inhalation capsule, Place into inhaler and inhale., Disp: , Rfl:   Past Surgical History  Procedure Laterality Date  . Breast surgery Left     Family History  Problem Relation Age of Onset  . Arthritis Mother   . Arthritis Father   . Cancer Sister     brain  . Heart disease Sister   . Stroke Sister   . Diabetes Brother     Allergies  Allergen Reactions  . Ciprofloxacin   . Inhaler Decongestant  [Methamphetamine]   . Molds & Smuts      Review of Systems  CONSTITUTIONAL: No significant weight changes, fever, chills, weakness.Yes fatigue.  HEENT:  - Eyes: No visual changes.  - Ears: No auditory changes. No pain.  - Nose: No sneezing. Yes congestion.  - Throat: No sore throat. No changes in swallowing. SKIN: No rash or itching.  CARDIOVASCULAR: No chest pain,  chest pressure or chest discomfort. No palpitations or edema.  RESPIRATORY: No shortness of breath, cough or sputum.  GASTROINTESTINAL: No anorexia, nausea, vomiting. Chronic constipation. No abdominal pain or blood.  GENITOURINARY: No dysuria. No frequency. No discharge.  NEUROLOGICAL: Yes headache. No dizziness, syncope, paralysis, ataxia, numbness or tingling in the extremities. No memory changes. No change in bowel or bladder control.  MUSCULOSKELETAL: No joint pain. No muscle pain. PSYCHIATRIC: No change in mood. No change in sleep pattern.  ENDOCRINOLOGIC: No reports of sweating, cold or heat intolerance. No polyuria or polydipsia.     Objective  BP 122/68 mmHg  Pulse 96  Temp(Src) 97.5 F (36.4 C) (Oral)  Resp 18  Ht 5\' 5"  (1.651 m)  Wt 183 lb 9.6 oz (83.28 kg)  BMI 30.55 kg/m2  SpO2 96% Body mass index is 30.55 kg/(m^2).  Physical Exam  Constitutional: Patient appears well-developed and well-nourished. In no distress. Smells of cigarette smoke. HEENT:  - Head: Normocephalic and atraumatic.  - Ears: Bilateral TMs gray, no erythema or effusion - Nose: Nasal mucosa moist, boggy. - Mouth/Throat: Oropharynx is clear and moist. No tonsillar hypertrophy or erythema. Yes post nasal drainage.  - Eyes: Conjunctivae clear, EOM movements normal. PERRLA. No scleral icterus.  Neck: Normal range of motion. Neck supple. No JVD present. No thyromegaly present.  Cardiovascular: Normal rate, regular rhythm and normal heart sounds. No murmur heard.  Pulmonary/Chest: Effort normal with scattered end expiratory rhonchi and wheezing. No respiratory distress. Abdomen: Soft, NT/ND, Normal BS, no HSM.  Musculoskeletal: Normal range of motion bilateral UE and LE, no joint effusions. Peripheral vascular: Bilateral LE trace pedal edema. Neurological: CN II-XII grossly intact with no focal deficits. Alert and oriented to person, place, and time. Coordination, balance, strength, speech and gait  are normal but she does have resting general tremor.  Skin: Skin is warm and dry. No rash noted. No erythema.  Psychiatric: Patient has a stable mood and affect. Behavior is normal in office today. Judgment and thought content normal in office today.  Recent Results (from the past 2160 hour(s))  POCT glycosylated hemoglobin (Hb A1C)     Status: Abnormal   Collection Time: 02/21/15  9:58 AM  Result Value Ref Range   Hemoglobin A1C 7.4   Urine Culture     Status: None   Collection Time: 04/17/15 12:00 AM  Result Value Ref Range   Urine Culture, Routine Final report    Urine Culture result 1 Comment     Comment: Mixed urogenital flora Less than 10,000 colonies/mL   POCT Urinalysis Dipstick     Status: Abnormal   Collection Time: 04/17/15 10:45 AM  Result Value Ref Range   Color, UA yellow    Clarity, UA clear    Glucose, UA negative    Bilirubin, UA negative    Ketones, UA negative    Spec Grav, UA 1.015    Blood, UA negative    pH, UA 6.0    Protein, UA trace    Urobilinogen, UA 0.2    Nitrite, UA negative    Leukocytes, UA moderate (2+) (A) Negative  CBC with Differential/Platelet     Status: None   Collection Time: 04/17/15 10:46 AM  Result Value Ref Range   WBC 7.3 3.4 - 10.8 x10E3/uL   RBC 4.38 3.77 - 5.28 x10E6/uL   Hemoglobin 12.2 11.1 - 15.9 g/dL   Hematocrit 37.6 34.0 - 46.6 %   MCV 86 79 - 97 fL   MCH 27.9 26.6 - 33.0 pg   MCHC 32.4 31.5 - 35.7 g/dL   RDW 14.5 12.3 - 15.4 %   Platelets 336 150 - 379 x10E3/uL   Neutrophils 54 %   Lymphs 32 %   Monocytes 10 %   Eos 2 %   Basos 1 %   Neutrophils Absolute 3.9 1.4 - 7.0 x10E3/uL   Lymphocytes Absolute 2.3 0.7 - 3.1 x10E3/uL   Monocytes Absolute 0.8 0.1 - 0.9 x10E3/uL   EOS (ABSOLUTE) 0.2 0.0 - 0.4 x10E3/uL   Basophils Absolute 0.0 0.0 - 0.2 x10E3/uL   Immature Granulocytes 1 %   Immature Grans (Abs) 0.1 0.0 - 0.1 x10E3/uL   NRBC 0 0-0 %  Comprehensive metabolic panel  Status: Abnormal   Collection Time:  04/17/15 10:46 AM  Result Value Ref Range   Glucose 170 (H) 65 - 99 mg/dL   BUN 9 8 - 27 mg/dL   Creatinine, Ser 0.47 (L) 0.57 - 1.00 mg/dL   GFR calc non Af Amer 93 >59 mL/min/1.73   GFR calc Af Amer 107 >59 mL/min/1.73   BUN/Creatinine Ratio 19 11 - 26   Sodium 132 (L) 136 - 144 mmol/L   Potassium 4.0 3.5 - 5.2 mmol/L   Chloride 91 (L) 97 - 106 mmol/L   CO2 26 18 - 29 mmol/L   Calcium 9.8 8.7 - 10.3 mg/dL   Total Protein 7.0 6.0 - 8.5 g/dL   Albumin 4.7 3.5 - 4.7 g/dL   Globulin, Total 2.3 1.5 - 4.5 g/dL   Albumin/Globulin Ratio 2.0 1.1 - 2.5   Bilirubin Total 0.3 0.0 - 1.2 mg/dL   Alkaline Phosphatase 60 39 - 117 IU/L   AST 18 0 - 40 IU/L   ALT 21 0 - 32 IU/L     Assessment & Plan  1. COPD with acute exacerbation ([place]) Resolved.   2. Constipation, slow transit Trial of Linzess.   - Linaclotide (LINZESS) 145 MCG CAPS capsule; Take 1 capsule (145 mcg total) by mouth daily.  Dispense: 90 capsule; Refill: 3  3. Morning headache May be due to poor oxygenation over night. If persistent will get [facility] to do overnight oxygen study. They supply her oxygen I believe.  - meloxicam (MOBIC) 15 MG tablet; Take 1 tablet (15 mg total) by mouth daily.  Dispense: 90 tablet; Refill: 2

## 2015-05-08 ENCOUNTER — Ambulatory Visit (INDEPENDENT_AMBULATORY_CARE_PROVIDER_SITE_OTHER): Payer: Medicare Other | Admitting: Family Medicine

## 2015-05-08 ENCOUNTER — Encounter: Payer: Self-pay | Admitting: Family Medicine

## 2015-05-08 VITALS — BP 140/70 | HR 89 | Temp 97.7°F | Resp 16 | Ht 65.0 in | Wt 186.8 lb

## 2015-05-08 DIAGNOSIS — R040 Epistaxis: Secondary | ICD-10-CM | POA: Diagnosis not present

## 2015-05-08 DIAGNOSIS — R0981 Nasal congestion: Secondary | ICD-10-CM

## 2015-05-08 MED ORDER — PREDNISONE 10 MG (21) PO TBPK: ORAL_TABLET | ORAL | Status: DC

## 2015-05-08 MED ORDER — AZITHROMYCIN 500 MG PO TABS
500.0000 mg | ORAL_TABLET | Freq: Every day | ORAL | Status: DC
Start: 2015-05-08 — End: 2015-05-30

## 2015-05-08 NOTE — Progress Notes (Signed)
Name: Jocelyn Burgess   MRN: [id number]    DOB: 07/09/1931   Date:05/08/2015       Progress Note  Subjective  Chief Complaint  Chief Complaint  Patient presents with  . Epistaxis    Onset 05/07/15 and happened again this morning.  Jocelyn Burgess    Patient states that right ear is hurting.    HPI  Jocelyn Burgess is a 81 year old heavy smoker with moderate-severe COPD oxygen dependent at night and sometimes during to day who is here accompanied by her husband as usual with complaints of dry nares and nose bleeds. She is here today with concerns regarding the following other symptoms congestion, ear pressure, sinus pressure and non productive cough that started weeks ago.  Associated with fatigue. Not associated with headaches. Has tried the following home remedies: Flonase.   Past Medical History  Diagnosis Date  . Allergy   . Cancer ([place])   . Diabetes mellitus without complication ([place])   . Arthritis   . Asthma   . Cataract   . COPD (chronic obstructive pulmonary disease) ([place])   . Depression     sometimes  . Hyperlipidemia   . Hypertension   . Oxygen deficiency     Social History  Substance Use Topics  . Smoking status: Current Every Day Smoker    Types: Cigarettes  . Smokeless tobacco: Current User  . Alcohol Use: No     Current outpatient prescriptions:  .  albuterol (VENTOLIN HFA) 108 (90 BASE) MCG/ACT inhaler, Inhale 2 puffs into the lungs every 4 (four) hours as needed., Disp: 3 Inhaler, Rfl: 3 .  aspirin 81 MG tablet, Take by mouth., Disp: , Rfl:  .  citalopram (CELEXA) 20 MG tablet, TAKE 1 TABLET (20 MG TOTAL) BY MOUTH DAILY., Disp: , Rfl: 3 .  cloNIDine (CATAPRES) 0.1 MG tablet, TAKE 1 TABLET BY MOUTH TWICE A DAY, Disp: 180 tablet, Rfl: 3 .  diazepam (VALIUM) 5 MG tablet, TAKE 1 TABLET BY MOUTH TWICE A DAY, Disp: 180 tablet, Rfl: 1 .  fluticasone (FLONASE) 50 MCG/ACT nasal spray, Place 2 sprays into both nostrils daily., Disp: 16 g, Rfl: 6 .  gabapentin  (NEURONTIN) 300 MG capsule, TAKE 1 CAPSULE BY MOUTH 3 TIMES A DAY (Patient not taking: Reported on 04/17/2015), Disp: 270 capsule, Rfl: 3 .  gemfibrozil (LOPID) 600 MG tablet, TAKE 1 TABLET BY MOUTH TWICE A DAY, Disp: 180 tablet, Rfl: 3 .  glimepiride (AMARYL) 4 MG tablet, , Disp: , Rfl:  .  glucose blood (ACCU-CHEK AVIVA PLUS) test strip, Use as instructed to check blood sugar 3x/day, Disp: 100 each, Rfl: 5 .  hydrochlorothiazide (HYDRODIURIL) 25 MG tablet, TAKE 1 TABLET BY MOUTH DAILY, Disp: 90 tablet, Rfl: 2 .  HYDROcodone-acetaminophen (NORCO) 5-325 MG per tablet, Take by mouth., Disp: , Rfl:  .  loratadine (CLARITIN) 10 MG tablet, Take by mouth., Disp: , Rfl:  .  lovastatin (MEVACOR) 20 MG tablet, TAKE 1 TABLET BY MOUTH EVERY DAY WITH EVENING MEAL, Disp: 90 tablet, Rfl: 1 .  magnesium hydroxide (MILK OF MAGNESIA) 400 MG/5ML suspension, Take 5 mLs by mouth daily as needed., Disp: , Rfl:  .  meclizine (ANTIVERT) 25 MG tablet, TAKE 1 TABLET BY MOUTH EVERY 8 HOURS AS NEEDED, Disp: 200 tablet, Rfl: 1 .  meloxicam (MOBIC) 15 MG tablet, Take 1 tablet (15 mg total) by mouth daily., Disp: 90 tablet, Rfl: 2 .  metFORMIN (GLUCOPHAGE) 1000 MG tablet, Take 1 tablet (1,000 mg  total) by mouth 2 (two) times daily with a meal., Disp: 180 tablet, Rfl: 3 .  montelukast (SINGULAIR) 10 MG tablet, TAKE 1 TABLET BY MOUTH EVERY EVENING, Disp: 90 tablet, Rfl: 3 .  theophylline (UNIPHYL) 400 MG 24 hr tablet, Take 200 mg by mouth 2 (two) times daily., Disp: , Rfl:  .  tiotropium (SPIRIVA HANDIHALER) 18 MCG inhalation capsule, Place into inhaler and inhale., Disp: , Rfl:   Allergies  Allergen Reactions  . Ciprofloxacin   . Inhaler Decongestant  [Methamphetamine]   . Molds & Smuts     ROS  Positive for fatigue, nasal congestion, sinus pressure, ear fullness, nose bleed mentioned in HPI, otherwise all systems reviewed and are negative.  Objective  Filed Vitals:   05/08/15 1023  BP: 140/70  Pulse: 89  Temp:  97.7 F (36.5 C)  TempSrc: Oral  Resp: 16  Height: 5\' 5"  (1.651 m)  Weight: 186 lb 12.8 oz (84.732 kg)  SpO2: 95%   Body mass index is 31.09 kg/(m^2).   Physical Exam  Constitutional: Patient appears well-developed and well-nourished. In no acute distress, smells of cigarette smoke as usual.  HEENT:  - Head: Normocephalic and atraumatic.  - Ears: RIGHT TM bulging with minimal clear exudate, LEFT TM bulging with minimal clear exudate.  - Nose: Nasal mucosa boggy and congested.  - Mouth/Throat: Oropharynx is moist with slight erythema of bilateral tonsils without hypertrophy or exudates. Post nasal drainage present.  - Eyes: Conjunctivae clear, EOM movements normal. PERRLA. No scleral icterus.  Neck: Normal range of motion. Neck supple. No JVD present. No thyromegaly present. No local lymphadenopathy. Cardiovascular: Normal rate, regular rhythm and normal heart sounds. No murmur heard.  Pulmonary/Chest: Effort normal with scattered end expiratory rhonchi and wheezing. No respiratory distress. Abdomen: Soft, NT/ND, Normal BS, no HSM.  Musculoskeletal: Normal range of motion bilateral UE and LE, no joint effusions. Peripheral vascular: Bilateral LE trace pedal edema. Neurological: CN II-XII grossly intact with no focal deficits. Alert and oriented to person, place, and time. Coordination, balance, strength, speech and gait are normal but she does have resting general tremor.  Skin: Skin is warm and dry. No rash noted. No erythema.  Psychiatric: Patient has a normal mood and affect. Behavior is normal in office today. Judgment and thought content normal in office today.   Assessment & Plan  1. Bleeding nose Home care instructed. May use mask instead of [place] for oxygen, it is humidified. Given bacitracin to apply to nose if needed, use Vaseline at night. Stop Flonase.  2. Sinus congestion Etiologies include initial allergic rhinitis or viral infection. Instructed patient on  increasing hydration, steam inhalation, NSAID if tolerated and not contraindicated. If not already doing so start taking daily anti-histamine. If symptoms persist/worsen may consider antibiotic therapy along with prednisone taper.  - predniSONE (STERAPRED UNI-PAK 21 TAB) 10 MG (21) TBPK tablet; Use as directed in a 6 day taper PredPak  Dispense: 21 tablet; Refill: 0 - azithromycin (ZITHROMAX) 500 MG tablet; Take 1 tablet (500 mg total) by mouth daily.  Dispense: 7 tablet; Refill: 0

## 2015-05-08 NOTE — Patient Instructions (Signed)

## 2015-05-10 ENCOUNTER — Emergency Department
Admission: EM | Admit: 2015-05-10 | Discharge: 2015-05-10 | Disposition: A | Discharge status: Home / Self Care | Payer: Medicare Other | Attending: Student | Admitting: Student

## 2015-05-10 DIAGNOSIS — E119 Type 2 diabetes mellitus without complications: Secondary | ICD-10-CM | POA: Insufficient documentation

## 2015-05-10 DIAGNOSIS — R040 Epistaxis: Secondary | ICD-10-CM | POA: Insufficient documentation

## 2015-05-10 DIAGNOSIS — Z7951 Long term (current) use of inhaled steroids: Secondary | ICD-10-CM | POA: Diagnosis not present

## 2015-05-10 DIAGNOSIS — Z7982 Long term (current) use of aspirin: Secondary | ICD-10-CM | POA: Insufficient documentation

## 2015-05-10 DIAGNOSIS — Z79899 Other long term (current) drug therapy: Secondary | ICD-10-CM | POA: Diagnosis not present

## 2015-05-10 DIAGNOSIS — Z7984 Long term (current) use of oral hypoglycemic drugs: Secondary | ICD-10-CM | POA: Insufficient documentation

## 2015-05-10 DIAGNOSIS — Z7952 Long term (current) use of systemic steroids: Secondary | ICD-10-CM | POA: Insufficient documentation

## 2015-05-10 DIAGNOSIS — Z792 Long term (current) use of antibiotics: Secondary | ICD-10-CM | POA: Diagnosis not present

## 2015-05-10 DIAGNOSIS — F17210 Nicotine dependence, cigarettes, uncomplicated: Secondary | ICD-10-CM | POA: Diagnosis not present

## 2015-05-10 DIAGNOSIS — I10 Essential (primary) hypertension: Secondary | ICD-10-CM | POA: Insufficient documentation

## 2015-05-10 LAB — CBC WITH DIFFERENTIAL/PLATELET
Basophils Absolute: 0 10*3/uL (ref 0–0.1)
Basophils Relative: 1 %
Eosinophils Absolute: 0 10*3/uL (ref 0–0.7)
Eosinophils Relative: 1 %
HEMATOCRIT: 38.1 % (ref 35.0–47.0)
HEMOGLOBIN: 12.4 g/dL (ref 12.0–16.0)
Lymphocytes Relative: 18 %
Lymphs Abs: 0.8 10*3/uL — ABNORMAL LOW (ref 1.0–3.6)
MCH: 27.1 pg (ref 26.0–34.0)
MCHC: 32.6 g/dL (ref 32.0–36.0)
MCV: 83.2 fL (ref 80.0–100.0)
MONOS PCT: 4 %
Monocytes Absolute: 0.2 10*3/uL (ref 0.2–0.9)
NEUTROS ABS: 3.4 10*3/uL (ref 1.4–6.5)
NEUTROS PCT: 76 %
Platelets: 327 10*3/uL (ref 150–440)
RBC: 4.58 MIL/uL (ref 3.80–5.20)
RDW: 14.9 % — ABNORMAL HIGH (ref 11.5–14.5)
WBC: 4.4 10*3/uL (ref 3.6–11.0)

## 2015-05-10 LAB — COMPREHENSIVE METABOLIC PANEL
ALK PHOS: 51 U/L (ref 38–126)
ALT: 20 U/L (ref 14–54)
ANION GAP: 10 (ref 5–15)
AST: 25 U/L (ref 15–41)
Albumin: 4.2 g/dL (ref 3.5–5.0)
BILIRUBIN TOTAL: 0.4 mg/dL (ref 0.3–1.2)
BUN: 9 mg/dL (ref 6–20)
CALCIUM: 9.7 mg/dL (ref 8.9–10.3)
CO2: 27 mmol/L (ref 22–32)
CREATININE: 0.59 mg/dL (ref 0.44–1.00)
Chloride: 91 mmol/L — ABNORMAL LOW (ref 101–111)
GFR calc Af Amer: >60 mL/min (ref >60)
GFR calc non Af Amer: >60 mL/min (ref >60)
GLUCOSE: 264 mg/dL — AB (ref 65–99)
Potassium: 3.9 mmol/L (ref 3.5–5.1)
SODIUM: 128 mmol/L — AB (ref 135–145)
TOTAL PROTEIN: 7.3 g/dL (ref 6.5–8.1)

## 2015-05-10 LAB — TYPE AND SCREEN
ABO/RH(D): A POS
Antibody Screen: NEGATIVE

## 2015-05-10 LAB — PROTIME-INR
INR: 0.94
Prothrombin Time: 12.8 seconds (ref 11.4–15.0)

## 2015-05-10 LAB — ABO/RH: ABO/RH(D): A POS

## 2015-05-10 LAB — APTT: aPTT: 28 seconds (ref 24–36)

## 2015-05-10 MED ORDER — AMOXICILLIN-POT CLAVULANATE 875-125 MG PO TABS: 1.0000 | ORAL_TABLET | Freq: Two times a day (BID) | ORAL | Status: AC

## 2015-05-10 MED ORDER — SODIUM CHLORIDE 0.9 % IV BOLUS (SEPSIS)
500.0000 mL | Freq: Once | INTRAVENOUS | Status: AC
  Administered 2015-05-10: 500 mL via INTRAVENOUS

## 2015-05-10 MED ORDER — PHENYLEPHRINE HCL 0.5 % NA SOLN
1.0000 [drp] | Freq: Once | NASAL | Status: AC
  Administered 2015-05-10: 1 [drp] via NASAL
  Filled 2015-05-10: qty 15

## 2015-05-10 NOTE — Discharge Instructions (Signed)
See the ENT doctor in 4-5 days for reevaluation and removal of the Rhino Rocket device in your nose. Return immediately to the emergency department if you develop return of bleeding, chest pain, difficulty breathing, fevers, severe headache, or for any new concerns.

## 2015-05-10 NOTE — ED Notes (Signed)
Pt presents via EMS c/o nose bleed. Started having episodes early this week and seen PCP on Wednesday and dx with sinus infection and dry mucus membranes per EMS report. No active bleeding currently. Dried blood noted to pt hands. States episode this am for apx 2 hours. Denies pain.

## 2015-05-10 NOTE — ED Provider Notes (Signed)
[facility] Emergency Department Provider Note  ____________________________________________  Time seen: Approximately 11:03 AM  I have reviewed the triage vital signs and the nursing notes.   HISTORY  Chief Complaint Epistaxis    HPI Jocelyn Burgess is a 81 y.o. female history of diabetes, COPD, hyperlipidemia, hypertension, on daily 81 mg aspirin but no other anticoagulants who presents for evaluation of gradual onset right-sided epistaxis today, sudden onset, constant since onset, currently severe. Patient reports that she has had intermittent bleeding from the right naris over the past week. She was diagnosed with rhinitis and the cause of her nosebleeds was thought to be secondary to dry mucous membranes. On EMS arrival, a large clot was dislodged from her nose and she began bleeding again heavily. She denies any chest pain or difficulty breathing. No fevers or chills. No vomiting or diarrhea.   Past Medical History  Diagnosis Date  . Allergy   . Cancer ([place])   . Diabetes mellitus without complication ([place])   . Arthritis   . Asthma   . Cataract   . COPD (chronic obstructive pulmonary disease) ([place])   . Depression     sometimes  . Hyperlipidemia   . Hypertension   . Oxygen deficiency     Patient Active Problem List   Diagnosis Date Noted  . Bleeding nose 05/08/2015  . Sinus congestion 05/08/2015  . Morning headache 05/01/2015  . Intermittent confusion 04/17/2015  . Cough 04/17/2015  . Constipation, slow transit 04/09/2015  . Allergic rhinitis with postnasal drip 04/09/2015  . Need for immunization against influenza 02/21/2015  . Hypertension goal BP (blood pressure) < 140/90 01/22/2015  . Acute antritis 11/23/2014  . Allergic rhinitis 11/23/2014  . Anxiety and depression 11/23/2014  . AB (asthmatic bronchitis) 11/23/2014  . Bronchitis with chronic airway obstruction ([place]) 11/23/2014  . Foot pain 11/23/2014  . Cigarette smoker 11/23/2014   . COPD, severe ([place]) 11/23/2014  . DD (diverticular disease) 11/23/2014  . COPD with acute exacerbation ([place]) 11/23/2014  . Personal history of malignant neoplasm of breast 11/23/2014  . History of digestive disease 11/23/2014  . Personal history of healed traumatic fracture 11/23/2014  . HLD (hyperlipidemia) 11/23/2014  . Peripheral neuropathic pain ([place]) 11/23/2014  . At risk for falling 11/23/2014  . Diabetes mellitus type 2, controlled, with complications ([place]) [id number]    Past Surgical History  Procedure Laterality Date  . Breast surgery Left     Current Outpatient Rx  Name  Route  Sig  Dispense  Refill  . albuterol (VENTOLIN HFA) 108 (90 BASE) MCG/ACT inhaler   Inhalation   Inhale 2 puffs into the lungs every 4 (four) hours as needed.   3 Inhaler   3   . amoxicillin-clavulanate (AUGMENTIN) 875-125 MG tablet   Oral   Take 1 tablet by mouth 2 (two) times daily.   14 tablet   0   . aspirin 81 MG tablet   Oral   Take by mouth.         Marland Kitchen azithromycin (ZITHROMAX) 500 MG tablet   Oral   Take 1 tablet (500 mg total) by mouth daily.   7 tablet   0     Do not dispense/fill until patient calls for it   . citalopram (CELEXA) 20 MG tablet      TAKE 1 TABLET (20 MG TOTAL) BY MOUTH DAILY.      3   . cloNIDine (CATAPRES) 0.1 MG tablet      TAKE  1 TABLET BY MOUTH TWICE A DAY   180 tablet   3   . diazepam (VALIUM) 5 MG tablet      TAKE 1 TABLET BY MOUTH TWICE A DAY   180 tablet   1     90 day supply with 1 refill   . gabapentin (NEURONTIN) 300 MG capsule      TAKE 1 CAPSULE BY MOUTH 3 TIMES A DAY Patient not taking: Reported on 04/17/2015   270 capsule   3   . gemfibrozil (LOPID) 600 MG tablet      TAKE 1 TABLET BY MOUTH TWICE A DAY   180 tablet   3   . glimepiride (AMARYL) 4 MG tablet               . glucose blood (ACCU-CHEK AVIVA PLUS) test strip      Use as instructed to check blood sugar 3x/day   100 each   5   . hydrochlorothiazide  (HYDRODIURIL) 25 MG tablet      TAKE 1 TABLET BY MOUTH DAILY   90 tablet   2   . HYDROcodone-acetaminophen (NORCO) 5-325 MG per tablet   Oral   Take by mouth.         . loratadine (CLARITIN) 10 MG tablet   Oral   Take by mouth.         . lovastatin (MEVACOR) 20 MG tablet      TAKE 1 TABLET BY MOUTH EVERY DAY WITH EVENING MEAL   90 tablet   1   . magnesium hydroxide (MILK OF MAGNESIA) 400 MG/5ML suspension   Oral   Take 5 mLs by mouth daily as needed.         . meclizine (ANTIVERT) 25 MG tablet      TAKE 1 TABLET BY MOUTH EVERY 8 HOURS AS NEEDED   200 tablet   1     90 day supply with one refill   . meloxicam (MOBIC) 15 MG tablet   Oral   Take 1 tablet (15 mg total) by mouth daily.   90 tablet   2   . metFORMIN (GLUCOPHAGE) 1000 MG tablet   Oral   Take 1 tablet (1,000 mg total) by mouth 2 (two) times daily with a meal.   180 tablet   3     Disregard previous doses and RXs for this medicati ...   . montelukast (SINGULAIR) 10 MG tablet      TAKE 1 TABLET BY MOUTH EVERY EVENING   90 tablet   3   . predniSONE (STERAPRED UNI-PAK 21 TAB) 10 MG (21) TBPK tablet      Use as directed in a 6 day taper PredPak   21 tablet   0     Do not dispense/fill until patient calls for it   . theophylline (UNIPHYL) 400 MG 24 hr tablet   Oral   Take 200 mg by mouth 2 (two) times daily.         Marland Kitchen tiotropium (SPIRIVA HANDIHALER) 18 MCG inhalation capsule   Inhalation   Place into inhaler and inhale.           Allergies Ciprofloxacin; Inhaler decongestant ; and Molds & smuts  Family History  Problem Relation Age of Onset  . Arthritis Mother   . Arthritis Father   . Cancer Sister     brain  . Heart disease Sister   . Stroke Sister   . Diabetes Brother  Social History Social History  Substance Use Topics  . Smoking status: Current Every Day Smoker    Types: Cigarettes  . Smokeless tobacco: Current User  . Alcohol Use: No    Review of  Systems Constitutional: No fever/chills Eyes: No visual changes. ENT: No sore throat. Cardiovascular: Denies chest pain. Respiratory: Denies shortness of breath. Gastrointestinal: No abdominal pain.  No nausea, no vomiting.  No diarrhea.  No constipation. Genitourinary: Negative for dysuria. Musculoskeletal: Negative for back pain. Skin: Negative for rash. Neurological: Negative for headaches, focal weakness or numbness.  10-point ROS otherwise negative.  ____________________________________________   PHYSICAL EXAM:  VITAL SIGNS: ED Triage Vitals  Enc Vitals Group     BP 05/10/15 1037 141/73 mmHg     Pulse Rate 05/10/15 1037 88     Resp 05/10/15 1037 16     Temp 05/10/15 1037 98.3 F (36.8 C)     Temp Source 05/10/15 1037 Oral     SpO2 05/10/15 1037 96 %     Weight 05/10/15 1037 182 lb (82.555 kg)     Height 05/10/15 1037 5\' 5"  (1.651 m)     Head Cir --      Peak Flow --      Pain Score --      Pain Loc --      Pain Edu? --      Excl. in [place]? --     Constitutional: Alert and oriented. Well appearing and in no acute distress. Eyes: Conjunctivae are normal. PERRL. EOMI. Head: Atraumatic. Nose: Small amount of active bleeding from the right naris. Mouth/Throat: Mucous membranes are moist.  Moderate amount of blood dripping down into the posterior oropharynx. Neck: No stridor.  Cardiovascular: Normal rate, regular rhythm. Grossly normal heart sounds.  Good peripheral circulation. Respiratory: Normal respiratory effort.  No retractions. Lungs CTAB. Gastrointestinal: Soft and nontender. No distention.  No CVA tenderness. Genitourinary: deferred Musculoskeletal: No lower extremity tenderness nor edema.  No joint effusions. Neurologic:  Normal speech and language. No gross focal neurologic deficits are appreciated. Skin:  Skin is warm, dry and intact. No rash noted. Psychiatric: Mood and affect are normal. Speech and behavior are  normal.  ____________________________________________   LABS (all labs ordered are listed, but only abnormal results are displayed)  Labs Reviewed  CBC WITH DIFFERENTIAL/PLATELET - Abnormal; Notable for the following:    RDW 14.9 (*)    Lymphs Abs 0.8 (*)    All other components within normal limits  COMPREHENSIVE METABOLIC PANEL - Abnormal; Notable for the following:    Sodium 128 (*)    Chloride 91 (*)    Glucose, Bld 264 (*)    All other components within normal limits  PROTIME-INR  APTT  TYPE AND SCREEN  ABO/RH   ____________________________________________  EKG  none ____________________________________________  RADIOLOGY  none ____________________________________________   PROCEDURES  Procedure(s) performed: Yes. See procedure note.  Anterior posterior Rhino Rocket applied to the right naris with resolution of bleeding.  Critical Care performed: No  ____________________________________________   INITIAL IMPRESSION / ASSESSMENT AND PLAN / ED COURSE  Pertinent labs & imaging results that were available during my care of the patient were reviewed by me and considered in my medical decision making (see chart for details).  Jocelyn Burgess is a 81 y.o. female history of diabetes, COPD, hyperlipidemia, hypertension, on daily 81 mg aspirin but no other anticoagulants who presents for evaluation of gradual onset right-sided epistaxis today, sudden onset, constant since onset, currently severe.  On arrival to the emergency department, she is noted to have bleeding from the right naris as well as a moderate amount of bleeding in the posterior oropharynx. 4 x 5 cm anterior Rhino Rocket was applied to the right naris without any improvement of her bleeding. This was removed and anterior/posterior Rhino Rocket was placed however the patient continues to have mild bleeding the posterior oropharynx is concerning for acute posterior epistaxis. Vital signs stable but given  continued bleeding despite this treatment here in the emergency department, I discussed the case with Dr. Sheppard Coil of ENT who will evaluate the patient.  ----------------------------------------- 1:26 PM on 05/10/2015 -----------------------------------------  Patient with complete resolution of epistaxis. No blood in the oropharynx. The [employer] is in place therefore I have discussed the case with Dr. Sheppard Coil of ENT and he agrrees the patient is suitable for discharge (we'll forego consultation as he has not yet arrived to evaluate the patient).  Hemoglobin appears stable at 12.4 which appears to be her baseline. Sodium is slightly lower than what was previously documented. Today sodium is 128, previously was 132 however it corrects appropriately when accounting for her hyper glycemia. We discussed return precautions, need for close ENT follow-up next week as well as need for close PCP follow-up for sodium recheck. The patient voices understanding of this and she and her husband about her comfortable with the discharge plan. DC home. ____________________________________________   FINAL CLINICAL IMPRESSION(S) / ED DIAGNOSES  Final diagnoses:  Acute posterior epistaxis      Joanne Gavel, MD 05/10/15 1605

## 2015-05-12 ENCOUNTER — Emergency Department
Admission: EM | Admit: 2015-05-12 | Discharge: 2015-05-12 | Disposition: A | Discharge status: Home / Self Care | Payer: Medicare Other | Attending: Emergency Medicine | Admitting: Emergency Medicine

## 2015-05-12 ENCOUNTER — Encounter: Payer: Self-pay | Admitting: Emergency Medicine

## 2015-05-12 ENCOUNTER — Emergency Department
Admission: EM | Admit: 2015-05-12 | Discharge: 2015-05-12 | Disposition: A | Discharge status: Home / Self Care | Payer: Medicare Other | Source: Home / Self Care | Attending: Emergency Medicine | Admitting: Emergency Medicine

## 2015-05-12 DIAGNOSIS — E119 Type 2 diabetes mellitus without complications: Secondary | ICD-10-CM | POA: Insufficient documentation

## 2015-05-12 DIAGNOSIS — Z7952 Long term (current) use of systemic steroids: Secondary | ICD-10-CM

## 2015-05-12 DIAGNOSIS — R040 Epistaxis: Secondary | ICD-10-CM | POA: Insufficient documentation

## 2015-05-12 DIAGNOSIS — Z79891 Long term (current) use of opiate analgesic: Secondary | ICD-10-CM | POA: Insufficient documentation

## 2015-05-12 DIAGNOSIS — Z79899 Other long term (current) drug therapy: Secondary | ICD-10-CM | POA: Diagnosis not present

## 2015-05-12 DIAGNOSIS — I10 Essential (primary) hypertension: Secondary | ICD-10-CM

## 2015-05-12 DIAGNOSIS — F17210 Nicotine dependence, cigarettes, uncomplicated: Secondary | ICD-10-CM

## 2015-05-12 DIAGNOSIS — Z791 Long term (current) use of non-steroidal anti-inflammatories (NSAID): Secondary | ICD-10-CM

## 2015-05-12 DIAGNOSIS — Z7982 Long term (current) use of aspirin: Secondary | ICD-10-CM | POA: Insufficient documentation

## 2015-05-12 DIAGNOSIS — Z792 Long term (current) use of antibiotics: Secondary | ICD-10-CM

## 2015-05-12 MED ORDER — OXYMETAZOLINE HCL 0.05 % NA SOLN
NASAL | Status: AC
  Filled 2015-05-12: qty 15

## 2015-05-12 NOTE — Discharge Instructions (Signed)
Nosebleed  Nosebleeds are common. A nosebleed can be caused by many things, including:  · Getting hit hard in the nose.  · Infections.  · Dryness in your nose.  · A dry climate.  · Medicines.  · Picking your nose.  · Your home heating and cooling systems.  HOME CARE   · Try controlling your nosebleed by pinching your nostrils gently. Do this for at least 10 minutes.  · Avoid blowing or sniffing your nose for a number of hours after having a nosebleed.  · Do not put gauze inside of your nose yourself. If your nose was packed by your doctor, try to keep the pack inside of your nose until your doctor removes it.    If a gauze pack was used and it starts to fall out, gently replace it or cut off the end of it.    If a balloon catheter was used to pack your nose, do not cut or remove it unless told by your doctor.  · Avoid lying down while you are having a nosebleed. Sit up and lean forward.  · Use a nasal spray decongestant to help with a nosebleed as told by your doctor.  · Do not use petroleum jelly or mineral oil in your nose. These can drip into your lungs.  · Keep your house humid by using:    Less air conditioning.    A humidifier.  · Aspirin and blood thinners make bleeding more likely. If you are prescribed these medicines and you have nosebleeds, ask your doctor if you should stop taking the medicines or adjust the dose. Do not stop medicines unless told by your doctor.  · Resume your normal activities as you are able. Avoid straining, lifting, or bending at your waist for several days.  · If your nosebleed was caused by dryness in your nose, use over-the-counter saline nasal spray or gel. If you must use a lubricant:    Choose one that is water-soluble.    Use it only as needed.    Do not use it within several hours of lying down.  · Keep all follow-up visits as told by your doctor. This is important.  GET HELP IF:  · You have a fever.  · You get frequent nosebleeds.  · You are getting nosebleeds more  often.  GET HELP RIGHT AWAY IF:  · Your nosebleed lasts longer than 20 minutes.  · Your nosebleed occurs after an injury to your face, and your nose looks crooked or broken.  · You have unusual bleeding from other parts of your body.  · You have unusual bruising on other parts of your body.  · You feel light-headed or dizzy.  · You become sweaty.  · You throw up (vomit) blood.  · You have a nosebleed after a head injury.     This information is not intended to replace advice given to you by your health care provider. Make sure you discuss any questions you have with your health care provider.     Document Released: 03/11/2008 Document Revised: 06/23/2014 Document Reviewed: 01/16/2014  Elsevier Interactive Patient Education ©2016 [employer]

## 2015-05-12 NOTE — ED Provider Notes (Signed)
[facility] Emergency Department Provider Note  ____________________________________________  Time seen: 5:35 AM  I have reviewed the triage vital signs and the nursing notes.  History by:  Patient known to me. Additional history directly from patient and her husband  HISTORY  Chief Complaint Epistaxis     HPI Jocelyn Burgess is a 81 y.o. female who I cared for just an hour to previously for This axis. She had presented to the emergency department at approximately 3 AM with a complaint of a single episode of epistaxis from her right knee years. The patient had epistaxis this past Thursday and was subsequently packed with a Rhino Rocket in the right nares.  When I saw the patient at 3:20 AM, her bleeding had stopped. There is no indication to remove and repack the patient's nares. She did have congealed blood in her posterior oropharynx but no active bleeding. We observed her for a while and the patient was doing okay. On reexam, the blood in her posterior oropharynx had cleared. At no time was or any visualization of the packing material in her posterior oropharynx.  The patient was discharged. The husband called back about an hour afterwards and reported that she was starting bleeding again and he looked in her throat and couldn't see the Rhino Rocket extending into the back of her throat from her nasopharynx. We instructed them to return to the emergency department.  Upon return, the patient had minor bleeding occurring. On exam, as noted below, I couldn't visualize the Rhino Rocket in the posterior oropharynx.    Past Medical History  Diagnosis Date  . Allergy   . Cancer ([place])   . Diabetes mellitus without complication ([place])   . Arthritis   . Asthma   . Cataract   . COPD (chronic obstructive pulmonary disease) ([place])   . Depression     sometimes  . Hyperlipidemia   . Hypertension   . Oxygen deficiency     Patient Active Problem List   Diagnosis  Date Noted  . Bleeding nose 05/08/2015  . Sinus congestion 05/08/2015  . Morning headache 05/01/2015  . Intermittent confusion 04/17/2015  . Cough 04/17/2015  . Constipation, slow transit 04/09/2015  . Allergic rhinitis with postnasal drip 04/09/2015  . Need for immunization against influenza 02/21/2015  . Hypertension goal BP (blood pressure) < 140/90 01/22/2015  . Acute antritis 11/23/2014  . Allergic rhinitis 11/23/2014  . Anxiety and depression 11/23/2014  . AB (asthmatic bronchitis) 11/23/2014  . Bronchitis with chronic airway obstruction ([place]) 11/23/2014  . Foot pain 11/23/2014  . Cigarette smoker 11/23/2014  . COPD, severe ([place]) 11/23/2014  . DD (diverticular disease) 11/23/2014  . COPD with acute exacerbation ([place]) 11/23/2014  . Personal history of malignant neoplasm of breast 11/23/2014  . History of digestive disease 11/23/2014  . Personal history of healed traumatic fracture 11/23/2014  . HLD (hyperlipidemia) 11/23/2014  . Peripheral neuropathic pain ([place]) 11/23/2014  . At risk for falling 11/23/2014  . Diabetes mellitus type 2, controlled, with complications ([place]) [id number]    Past Surgical History  Procedure Laterality Date  . Breast surgery Left     Current Outpatient Rx  Name  Route  Sig  Dispense  Refill  . albuterol (VENTOLIN HFA) 108 (90 BASE) MCG/ACT inhaler   Inhalation   Inhale 2 puffs into the lungs every 4 (four) hours as needed.   3 Inhaler   3   . amoxicillin-clavulanate (AUGMENTIN) 875-125 MG tablet   Oral  Take 1 tablet by mouth 2 (two) times daily.   14 tablet   0   . aspirin 81 MG tablet   Oral   Take by mouth.         Marland Kitchen azithromycin (ZITHROMAX) 500 MG tablet   Oral   Take 1 tablet (500 mg total) by mouth daily.   7 tablet   0     Do not dispense/fill until patient calls for it   . citalopram (CELEXA) 20 MG tablet      TAKE 1 TABLET (20 MG TOTAL) BY MOUTH DAILY.      3   . cloNIDine (CATAPRES) 0.1 MG tablet      TAKE  1 TABLET BY MOUTH TWICE A DAY   180 tablet   3   . diazepam (VALIUM) 5 MG tablet      TAKE 1 TABLET BY MOUTH TWICE A DAY   180 tablet   1     90 day supply with 1 refill   . gabapentin (NEURONTIN) 300 MG capsule      TAKE 1 CAPSULE BY MOUTH 3 TIMES A DAY Patient not taking: Reported on 04/17/2015   270 capsule   3   . gemfibrozil (LOPID) 600 MG tablet      TAKE 1 TABLET BY MOUTH TWICE A DAY   180 tablet   3   . glimepiride (AMARYL) 4 MG tablet               . glucose blood (ACCU-CHEK AVIVA PLUS) test strip      Use as instructed to check blood sugar 3x/day   100 each   5   . hydrochlorothiazide (HYDRODIURIL) 25 MG tablet      TAKE 1 TABLET BY MOUTH DAILY   90 tablet   2   . HYDROcodone-acetaminophen (NORCO) 5-325 MG per tablet   Oral   Take by mouth.         . loratadine (CLARITIN) 10 MG tablet   Oral   Take by mouth.         . lovastatin (MEVACOR) 20 MG tablet      TAKE 1 TABLET BY MOUTH EVERY DAY WITH EVENING MEAL   90 tablet   1   . magnesium hydroxide (MILK OF MAGNESIA) 400 MG/5ML suspension   Oral   Take 5 mLs by mouth daily as needed.         . meclizine (ANTIVERT) 25 MG tablet      TAKE 1 TABLET BY MOUTH EVERY 8 HOURS AS NEEDED   200 tablet   1     90 day supply with one refill   . meloxicam (MOBIC) 15 MG tablet   Oral   Take 1 tablet (15 mg total) by mouth daily.   90 tablet   2   . metFORMIN (GLUCOPHAGE) 1000 MG tablet   Oral   Take 1 tablet (1,000 mg total) by mouth 2 (two) times daily with a meal.   180 tablet   3     Disregard previous doses and RXs for this medicati ...   . montelukast (SINGULAIR) 10 MG tablet      TAKE 1 TABLET BY MOUTH EVERY EVENING   90 tablet   3   . predniSONE (STERAPRED UNI-PAK 21 TAB) 10 MG (21) TBPK tablet      Use as directed in a 6 day taper PredPak   21 tablet   0     Do  not dispense/fill until patient calls for it   . theophylline (UNIPHYL) 400 MG 24 hr tablet   Oral    Take 200 mg by mouth 2 (two) times daily.         Marland Kitchen tiotropium (SPIRIVA HANDIHALER) 18 MCG inhalation capsule   Inhalation   Place into inhaler and inhale.           Allergies Ciprofloxacin; Inhaler decongestant ; and Molds & smuts  Family History  Problem Relation Age of Onset  . Arthritis Mother   . Arthritis Father   . Cancer Sister     brain  . Heart disease Sister   . Stroke Sister   . Diabetes Brother     Social History Social History  Substance Use Topics  . Smoking status: Current Every Day Smoker -- 1.00 packs/day for 45 years    Types: Cigarettes  . Smokeless tobacco: Current User  . Alcohol Use: No    Review of Systems  Constitutional: Negative for fever/chills. ENT: Positive for epistaxis. See history of present illness Cardiovascular: Negative for chest pain. Respiratory: Negative for cough. Gastrointestinal: Negative for abdominal pain, vomiting and diarrhea. Genitourinary: Negative for dysuria. Musculoskeletal: No myalgias or injuries. Skin: Negative for rash. Neurological: Negative for headache or focal weakness   10-point ROS otherwise negative.  ____________________________________________   PHYSICAL EXAM:  VITAL SIGNS: ED Triage Vitals  Enc Vitals Group     BP 05/12/15 0529 173/82 mmHg     Pulse Rate 05/12/15 0529 79     Resp --      Temp --      Temp src --      SpO2 05/12/15 0529 94 %     Weight --      Height --      Head Cir --      Peak Flow --      Pain Score --      Pain Loc --      Pain Edu? --      Excl. in [place]? --     Constitutional: Alert and oriented. Active mild bleeding from her nose and into her posterior oropharynx. Patient appears uncomfortable. ENT   Head: Normocephalic and atraumatic.   Nose: Rhino Rocket still present in the right nares with 2 tales coming from the nostril. Active mild to moderate bleeding..       Mouth:  There is some blood in the posterior oropharynx. I do not see any heavy  flow of blood. She does have the distal tip of the Rhino Rocket extending from the nasopharynx into the posterior oropharynx. Cardiovascular: Normal rate, regular rhythm, no murmur noted Respiratory: Normal respiratory effort, no tachypnea.  Breath sounds are clear and equal bilaterally.  Gastrointestinal: Soft and nontender. No distention.  Back: No muscle spasm, no tenderness, no CVA tenderness. Musculoskeletal: No deformity noted. Nontender with normal range of motion in all extremities. No noted edema. Neurologic: Communicative. Normal appearing spontaneous movement in all 4 extremities. No gross focal neurologic deficits are appreciated.  Skin: Skin is warm, dry. No rash noted. Psychiatric: Mood and affect are normal. Speech and behavior are normal.  ____________________________________________    PROCEDURES  Repacking of right nostril.:  Due to the patient's repeat bleeding and shift in the position of the [employer], we quickly moved to remove the [employer] from the right naris and replace it.  Removal of the Rhino Rocket was as expected relatively easy.  The patient did have some minor  bleeding continuing, but she did not have significant heavy flow. I had her blow her nose to clear the nostril and she had minimal clot at that time.  I then moved to administer nasal spray. I asked her to blow out all the way and was repaired to squirt nasal spray up her nose when she breathed then. In the process of breathing out she blew her nose further and had a large clot project out along with blood. I was then able to spray Afrin up into her nose.  Following this, I place a 10 cm Merocel sponge into her right nares with only a little bit of [place] on it. Overall, this was fairly well tolerated and passed through the nostril easily. It was then instilled with further Afrin to help with topical application.  At this time, the patient has no active bleeding and  appears more comfortable.    ____________________________________________   INITIAL IMPRESSION / ASSESSMENT AND PLAN / ED COURSE  Pertinent labs & imaging results that were available during my care of the patient were reviewed by me and considered in my medical decision making (see chart for details).  81 year old female that I had just discharged approximately one hour prior, with renewed epistaxis and displacement of the Rhino rocket set up and placed on Thursday. Please see above for my note about removing the Rhino Rocket's and replacing them with a Merocel sponge. The patient now appears much more comfortable and has no ongoing bleeding. She is ambulatory in the emergency department. We will discharge her follow-up with ENT in 3-4 days.  ____________________________________________   FINAL CLINICAL IMPRESSION(S) / ED DIAGNOSES  Final diagnoses:  Bleeding nose      Ahmed Prima, MD 05/12/15 [phone]

## 2015-05-12 NOTE — ED Provider Notes (Signed)
[facility] Emergency Department Provider Note  ____________________________________________  Time seen: 3:20 AM  I have reviewed the triage vital signs and the nursing notes.  History by:  Patient and her husband.  HISTORY  Chief Complaint Epistaxis     HPI Jocelyn Burgess is a 81 y.o. female who was seen here this past Thursday with epistaxis. She had Rhino Rocket's place in her right nares. There are 2 of them, one posterior and one anterior. She was placed on amoxicillin and discharged home.  Tonight, proximally 1 AM, she got up to go to the bathroom and had a sudden release of blood. She said all he came out through her right nostril and she had none in the left nostril. They came to the emergency Department but upon arrival had minimal bleeding.  The patient is not on anticoagulants. She denies any bleeding at home until this event tonight.   Past Medical History  Diagnosis Date  . Allergy   . Cancer ([place])   . Diabetes mellitus without complication ([place])   . Arthritis   . Asthma   . Cataract   . COPD (chronic obstructive pulmonary disease) ([place])   . Depression     sometimes  . Hyperlipidemia   . Hypertension   . Oxygen deficiency     Patient Active Problem List   Diagnosis Date Noted  . Bleeding nose 05/08/2015  . Sinus congestion 05/08/2015  . Morning headache 05/01/2015  . Intermittent confusion 04/17/2015  . Cough 04/17/2015  . Constipation, slow transit 04/09/2015  . Allergic rhinitis with postnasal drip 04/09/2015  . Need for immunization against influenza 02/21/2015  . Hypertension goal BP (blood pressure) < 140/90 01/22/2015  . Acute antritis 11/23/2014  . Allergic rhinitis 11/23/2014  . Anxiety and depression 11/23/2014  . AB (asthmatic bronchitis) 11/23/2014  . Bronchitis with chronic airway obstruction ([place]) 11/23/2014  . Foot pain 11/23/2014  . Cigarette smoker 11/23/2014  . COPD, severe ([place]) 11/23/2014  . DD  (diverticular disease) 11/23/2014  . COPD with acute exacerbation ([place]) 11/23/2014  . Personal history of malignant neoplasm of breast 11/23/2014  . History of digestive disease 11/23/2014  . Personal history of healed traumatic fracture 11/23/2014  . HLD (hyperlipidemia) 11/23/2014  . Peripheral neuropathic pain ([place]) 11/23/2014  . At risk for falling 11/23/2014  . Diabetes mellitus type 2, controlled, with complications ([place]) [id number]    Past Surgical History  Procedure Laterality Date  . Breast surgery Left     Current Outpatient Rx  Name  Route  Sig  Dispense  Refill  . albuterol (VENTOLIN HFA) 108 (90 BASE) MCG/ACT inhaler   Inhalation   Inhale 2 puffs into the lungs every 4 (four) hours as needed.   3 Inhaler   3   . amoxicillin-clavulanate (AUGMENTIN) 875-125 MG tablet   Oral   Take 1 tablet by mouth 2 (two) times daily.   14 tablet   0   . aspirin 81 MG tablet   Oral   Take by mouth.         Marland Kitchen azithromycin (ZITHROMAX) 500 MG tablet   Oral   Take 1 tablet (500 mg total) by mouth daily.   7 tablet   0     Do not dispense/fill until patient calls for it   . citalopram (CELEXA) 20 MG tablet      TAKE 1 TABLET (20 MG TOTAL) BY MOUTH DAILY.      3   . cloNIDine (CATAPRES)  0.1 MG tablet      TAKE 1 TABLET BY MOUTH TWICE A DAY   180 tablet   3   . diazepam (VALIUM) 5 MG tablet      TAKE 1 TABLET BY MOUTH TWICE A DAY   180 tablet   1     90 day supply with 1 refill   . gabapentin (NEURONTIN) 300 MG capsule      TAKE 1 CAPSULE BY MOUTH 3 TIMES A DAY Patient not taking: Reported on 04/17/2015   270 capsule   3   . gemfibrozil (LOPID) 600 MG tablet      TAKE 1 TABLET BY MOUTH TWICE A DAY   180 tablet   3   . glimepiride (AMARYL) 4 MG tablet               . glucose blood (ACCU-CHEK AVIVA PLUS) test strip      Use as instructed to check blood sugar 3x/day   100 each   5   . hydrochlorothiazide (HYDRODIURIL) 25 MG tablet      TAKE 1  TABLET BY MOUTH DAILY   90 tablet   2   . HYDROcodone-acetaminophen (NORCO) 5-325 MG per tablet   Oral   Take by mouth.         . loratadine (CLARITIN) 10 MG tablet   Oral   Take by mouth.         . lovastatin (MEVACOR) 20 MG tablet      TAKE 1 TABLET BY MOUTH EVERY DAY WITH EVENING MEAL   90 tablet   1   . magnesium hydroxide (MILK OF MAGNESIA) 400 MG/5ML suspension   Oral   Take 5 mLs by mouth daily as needed.         . meclizine (ANTIVERT) 25 MG tablet      TAKE 1 TABLET BY MOUTH EVERY 8 HOURS AS NEEDED   200 tablet   1     90 day supply with one refill   . meloxicam (MOBIC) 15 MG tablet   Oral   Take 1 tablet (15 mg total) by mouth daily.   90 tablet   2   . metFORMIN (GLUCOPHAGE) 1000 MG tablet   Oral   Take 1 tablet (1,000 mg total) by mouth 2 (two) times daily with a meal.   180 tablet   3     Disregard previous doses and RXs for this medicati ...   . montelukast (SINGULAIR) 10 MG tablet      TAKE 1 TABLET BY MOUTH EVERY EVENING   90 tablet   3   . predniSONE (STERAPRED UNI-PAK 21 TAB) 10 MG (21) TBPK tablet      Use as directed in a 6 day taper PredPak   21 tablet   0     Do not dispense/fill until patient calls for it   . theophylline (UNIPHYL) 400 MG 24 hr tablet   Oral   Take 200 mg by mouth 2 (two) times daily.         Marland Kitchen tiotropium (SPIRIVA HANDIHALER) 18 MCG inhalation capsule   Inhalation   Place into inhaler and inhale.           Allergies Ciprofloxacin; Inhaler decongestant ; and Molds & smuts  Family History  Problem Relation Age of Onset  . Arthritis Mother   . Arthritis Father   . Cancer Sister     brain  . Heart disease Sister   .  Stroke Sister   . Diabetes Brother     Social History Social History  Substance Use Topics  . Smoking status: Current Every Day Smoker -- 1.00 packs/day for 45 years    Types: Cigarettes  . Smokeless tobacco: Current User  . Alcohol Use: No    Review of  Systems  Constitutional: Negative for fever/chills. ENT: Positive for epistaxis. See history of present illness Cardiovascular: Negative for chest pain. Respiratory: Negative for cough. Gastrointestinal: Negative for abdominal pain, vomiting and diarrhea. Genitourinary: Negative for dysuria. Musculoskeletal: No myalgias or injuries. Skin: Negative for rash. Neurological: Negative for headache or focal weakness   10-point ROS otherwise negative.  ____________________________________________   PHYSICAL EXAM:  VITAL SIGNS: ED Triage Vitals  Enc Vitals Group     BP 05/12/15 0136 133/64 mmHg     Pulse Rate 05/12/15 0135 88     Resp 05/12/15 0135 20     Temp 05/12/15 0135 98 F (36.7 C)     Temp Source 05/12/15 0135 Oral     SpO2 05/12/15 0135 94 %     Weight 05/12/15 0134 185 lb (83.915 kg)     Height 05/12/15 0134 5\' 5"  (1.651 m)     Head Cir --      Peak Flow --      Pain Score 05/12/15 0134 0     Pain Loc --      Pain Edu? --      Excl. in [place]? --     Constitutional: Alert and oriented. There are details of 2 Rhino Rocket's extending out of the right nares. No active bleeding currently. Patient in no acute distress. ENT   Head: Normocephalic and atraumatic.   Nose: No congestion/rhinnorhea.       Mouth: No erythema, no swelling. Patient has noted coagulated blood in her posterior oropharynx. No active bleeding.   Cardiovascular: Normal rate, regular rhythm, no murmur noted Respiratory:  Normal respiratory effort, no tachypnea.    Breath sounds are clear and equal bilaterally.  Gastrointestinal: Soft and nontender. No distention.  Back: No muscle spasm, no tenderness, no CVA tenderness. Musculoskeletal: No deformity noted. Nontender with normal range of motion in all extremities.  No noted edema. Neurologic:  Communicative. Normal appearing spontaneous movement in all 4 extremities. No gross focal neurologic deficits are appreciated.  Skin:  Skin is warm, dry. No  rash noted. Psychiatric: Mood and affect are normal. Speech and behavior are normal.  ____________________________________________   ____________________________________________   INITIAL IMPRESSION / ASSESSMENT AND PLAN / ED COURSE  Pertinent labs & imaging results that were available during my care of the patient were reviewed by me and considered in my medical decision making (see chart for details).  Closing, overall well-appearing, 81 year old female with recent nasal packing due to epistaxis, now with rebleed. This rebleeding episode appears limited. Leading has stopped and she has no ongoing bleeding now. She did have blood in the oropharynx. We had her drink some water and I reexamined her 20 minutes afterwards and found the oropharynx clear. I discussed the case with Dr. Edd Fabian who had seen the patient on Thursday. At this time, I see no immediate intervention needed. Will leave the packing in place.   Of note, the bulb of the details of the Rhino Rocket's appear little bit soft. I have added a to both of them, approximately 2 mL each.   ____________________________________________   FINAL CLINICAL IMPRESSION(S) / ED DIAGNOSES  Final diagnoses:  Recurrent epistaxis  Ahmed Prima, MD 05/12/15 [phone]

## 2015-05-12 NOTE — ED Notes (Signed)
Patient here with complaint of nose bleed. Patient was seen here Thursday for the same. Patient with packing to right nostril. Patient reports that when she got up to the bathroom she had a large amount of bleeding from her nose. Patient with minimal bleeding at this time.

## 2015-05-12 NOTE — ED Notes (Signed)
Pt. States she was here a couple hours ago and had rhino insert put in.  Pt. Husband states pt. Coughed or sneezed and nose started to bleed again.

## 2015-05-12 NOTE — Discharge Instructions (Signed)
We removed the Rhino Rocket that had slipped posterior and were seen in the back of your mouth. We then placed new packing with a Merocel sponge. This should control bleeding and same position better. Please follow-up with Dr. Pryor Ochoa as initially planned. Return to emergency department if you have any further bleeding or other urgent concerns.  Nosebleed Nosebleeds are common. They are due to a crack in the inside lining of your nose (mucous membrane) or from a small blood vessel that starts to bleed. Nosebleeds can be caused by many conditions, such as injury, infections, dry mucous membranes or dry climate, medicines, nose picking, and home heating and cooling systems. Most nosebleeds come from blood vessels in the front of your nose. HOME CARE INSTRUCTIONS   Try controlling your nosebleed by pinching your nostrils gently and continuously for at least 10 minutes.  Avoid blowing or sniffing your nose for a number of hours after having a nosebleed.  Do not put gauze inside your nose yourself. If your nose was packed by your health care provider, try to maintain the pack inside of your nose until your health care provider removes it.  If a gauze pack was used and it starts to fall out, gently replace it or cut off the end of it.  If a balloon catheter was used to pack your nose, do not cut or remove it unless your health care provider has instructed you to do that.  Avoid lying down while you are having a nosebleed. Sit up and lean forward.  Use a nasal spray decongestant to help with a nosebleed as directed by your health care provider.  Do not use petroleum jelly or mineral oil in your nose. These can drip into your lungs.  Maintain humidity in your home by using less air conditioning or by using a humidifier.  Aspirinand blood thinners make bleeding more likely. If you are prescribed these medicines and you suffer from nosebleeds, ask your health care provider if you should stop taking the  medicines or adjust the dose. Do not stop medicines unless directed by your health care provider  Resume your normal activities as you are able, but avoid straining, lifting, or bending at the waist for several days.  If your nosebleed was caused by dry mucous membranes, use over-the-counter saline nasal spray or gel. This will keep the mucous membranes moist and allow them to heal. If you must use a lubricant, choose the water-soluble variety. Use it only sparingly, and do not use it within several hours of lying down.  Keep all follow-up visits as directed by your health care provider. This is important. SEEK MEDICAL CARE IF:  You have a fever.  You get frequent nosebleeds.  You are getting nosebleeds more often. SEEK IMMEDIATE MEDICAL CARE IF:  Your nosebleed lasts longer than 20 minutes.  Your nosebleed occurs after an injury to your face, and your nose looks crooked or broken.  You have unusual bleeding from other parts of your body.  You have unusual bruising on other parts of your body.  You feel light-headed or you faint.  You become sweaty.  You vomit blood.  Your nosebleed occurs after a head injury.   This information is not intended to replace advice given to you by your health care provider. Make sure you discuss any questions you have with your health care provider.   Document Released: 03/12/2005 Document Revised: 06/23/2014 Document Reviewed: 01/16/2014 Elsevier Interactive Patient Education [employer].

## 2015-05-14 ENCOUNTER — Other Ambulatory Visit: Payer: Self-pay | Admitting: Family Medicine

## 2015-05-14 ENCOUNTER — Telehealth: Payer: Self-pay | Admitting: Family Medicine

## 2015-05-14 DIAGNOSIS — R040 Epistaxis: Secondary | ICD-10-CM

## 2015-05-14 NOTE — Telephone Encounter (Signed)
Patient had severe nose blood and was taken to ER on Thanksgiving day. They packed her nose with Rapid Rhino. It helped until Saturday and had to go the the ER again it did not hold and she had to go back to the ER Saturday. They then repacked it and it is holding pretty good. She does have a referral appointment with [facility] (Dr Carloyn Manner) on Wednesday morning 9am. Please send over referral

## 2015-05-14 NOTE — Telephone Encounter (Signed)
Referral placed. I have reviewed the hospital encounter notes.

## 2015-05-15 ENCOUNTER — Other Ambulatory Visit: Payer: Self-pay | Admitting: Family Medicine

## 2015-05-23 ENCOUNTER — Other Ambulatory Visit: Payer: Self-pay | Admitting: Family Medicine

## 2015-05-23 ENCOUNTER — Telehealth: Payer: Self-pay | Admitting: Family Medicine

## 2015-05-23 DIAGNOSIS — B370 Candidal stomatitis: Secondary | ICD-10-CM

## 2015-05-23 MED ORDER — FLUCONAZOLE 150 MG PO TABS: 150.0000 mg | ORAL_TABLET | Freq: Once | ORAL | Status: DC

## 2015-05-23 MED ORDER — NYSTATIN 100000 UNIT/ML MT SUSP: 5.0000 mL | Freq: Four times a day (QID) | OROMUCOSAL | Status: DC

## 2015-05-23 NOTE — Telephone Encounter (Signed)
Sent in Fluconazole and Nystatin swish and swallow.

## 2015-05-23 NOTE — Telephone Encounter (Signed)
Husband called stating pt has thrush in her mouth due to be on amoxicillan and is wanting to know if something can be called into [facility]

## 2015-05-25 ENCOUNTER — Telehealth: Payer: Self-pay | Admitting: Family Medicine

## 2015-05-25 NOTE — Telephone Encounter (Signed)
Once before it had been discussed about getting her an portable oxygen backpack (it makes its on oxygen) for the day time. She would now like for you to write a prescription, please send to [facility].

## 2015-05-28 NOTE — Telephone Encounter (Signed)
Have [employer] send me form for portable oxygen.

## 2015-05-30 ENCOUNTER — Encounter: Payer: Self-pay | Admitting: Family Medicine

## 2015-05-30 ENCOUNTER — Ambulatory Visit (INDEPENDENT_AMBULATORY_CARE_PROVIDER_SITE_OTHER): Payer: Medicare Other | Admitting: Family Medicine

## 2015-05-30 VITALS — BP 138/76 | HR 95 | Temp 97.5°F | Resp 18 | Wt 181.2 lb

## 2015-05-30 DIAGNOSIS — F17210 Nicotine dependence, cigarettes, uncomplicated: Secondary | ICD-10-CM | POA: Diagnosis not present

## 2015-05-30 DIAGNOSIS — J449 Chronic obstructive pulmonary disease, unspecified: Secondary | ICD-10-CM

## 2015-05-30 DIAGNOSIS — R600 Localized edema: Secondary | ICD-10-CM | POA: Insufficient documentation

## 2015-05-30 DIAGNOSIS — I10 Essential (primary) hypertension: Secondary | ICD-10-CM | POA: Diagnosis not present

## 2015-05-30 MED ORDER — FUROSEMIDE 20 MG PO TABS: ORAL_TABLET | ORAL | Status: DC

## 2015-05-30 NOTE — Progress Notes (Signed)
Name: NEA GITTENS   MRN: [id number]    DOB: [birth year]   Date:05/30/2015       Progress Note  Subjective  Chief Complaint  Chief Complaint  Patient presents with  . Foot Swelling    bilateral foot swelling fo almost 2 weeks    HPI  Melicia Zaro is a 81 year old heavy smoker with moderate-severe COPD oxygen dependent at night and sometimes during to day who is here accompanied by her husband as usual with complaints of swelling and edema of feet for nearly 2 weeks now. Not associated with chest pain, worsening shortness of breath although she is always short of breath. Cardiac risk factors include HTN, HLD, Heavy cigarette smoker. Kidney creatine function normal on lab work 05/10/15.   Regarding her COPD, she is having poor oxygenation during the day time too. Causing confusion intermittently. Also they got a letter from her insurance that Ventolin is not on their formulary however she had severe intolerant reaction to ProAir: nausea, palpitations, shaking.   Current oxygen company [employer].    Past Medical History  Diagnosis Date  . Allergy   . Cancer ([place])   . Diabetes mellitus without complication ([place])   . Arthritis   . Asthma   . Cataract   . COPD (chronic obstructive pulmonary disease) ([place])   . Depression     sometimes  . Hyperlipidemia   . Hypertension   . Oxygen deficiency     Patient Active Problem List   Diagnosis Date Noted  . Bleeding nose 05/08/2015  . Sinus congestion 05/08/2015  . Morning headache 05/01/2015  . Intermittent confusion 04/17/2015  . Cough 04/17/2015  . Constipation, slow transit 04/09/2015  . Allergic rhinitis with postnasal drip 04/09/2015  . Need for immunization against influenza 02/21/2015  . Hypertension goal BP (blood pressure) < 140/90 01/22/2015  . Acute antritis 11/23/2014  . Allergic rhinitis 11/23/2014  . Anxiety and depression 11/23/2014  . AB (asthmatic bronchitis) 11/23/2014  . Bronchitis with chronic airway  obstruction ([place]) 11/23/2014  . Foot pain 11/23/2014  . Cigarette smoker 11/23/2014  . COPD, severe ([place]) 11/23/2014  . DD (diverticular disease) 11/23/2014  . COPD with acute exacerbation ([place]) 11/23/2014  . Personal history of malignant neoplasm of breast 11/23/2014  . History of digestive disease 11/23/2014  . Personal history of healed traumatic fracture 11/23/2014  . HLD (hyperlipidemia) 11/23/2014  . Peripheral neuropathic pain ([place]) 11/23/2014  . At risk for falling 11/23/2014  . Diabetes mellitus type 2, controlled, with complications ([place]) 81/44/8185    Social History  Substance Use Topics  . Smoking status: Current Every Day Smoker -- 1.00 packs/day for 45 years    Types: Cigarettes  . Smokeless tobacco: Current User  . Alcohol Use: No     Current outpatient prescriptions:  .  aspirin 81 MG tablet, Take by mouth., Disp: , Rfl:  .  citalopram (CELEXA) 20 MG tablet, TAKE 1 TABLET (20 MG TOTAL) BY MOUTH DAILY., Disp: , Rfl: 3 .  cloNIDine (CATAPRES) 0.1 MG tablet, TAKE 1 TABLET BY MOUTH TWICE A DAY, Disp: 180 tablet, Rfl: 3 .  diazepam (VALIUM) 5 MG tablet, TAKE 1 TABLET BY MOUTH TWICE A DAY, Disp: 180 tablet, Rfl: 1 .  gabapentin (NEURONTIN) 300 MG capsule, TAKE 1 CAPSULE BY MOUTH 3 TIMES A DAY (Patient not taking: Reported on 04/17/2015), Disp: 270 capsule, Rfl: 3 .  gemfibrozil (LOPID) 600 MG tablet, TAKE 1 TABLET BY MOUTH TWICE A DAY, Disp: 180 tablet,  Rfl: 3 .  glimepiride (AMARYL) 4 MG tablet, , Disp: , Rfl:  .  glucose blood (ACCU-CHEK AVIVA PLUS) test strip, Use as instructed to check blood sugar 3x/day, Disp: 100 each, Rfl: 5 .  hydrochlorothiazide (HYDRODIURIL) 25 MG tablet, TAKE 1 TABLET BY MOUTH DAILY, Disp: 90 tablet, Rfl: 2 .  HYDROcodone-acetaminophen (NORCO) 5-325 MG per tablet, Take by mouth., Disp: , Rfl:  .  loratadine (CLARITIN) 10 MG tablet, Take by mouth., Disp: , Rfl:  .  lovastatin (MEVACOR) 20 MG tablet, TAKE 1 TABLET BY MOUTH EVERY DAY WITH EVENING  MEAL, Disp: 90 tablet, Rfl: 1 .  magnesium hydroxide (MILK OF MAGNESIA) 400 MG/5ML suspension, Take 5 mLs by mouth daily as needed., Disp: , Rfl:  .  meclizine (ANTIVERT) 25 MG tablet, TAKE 1 TABLET BY MOUTH EVERY 8 HOURS AS NEEDED, Disp: 200 tablet, Rfl: 1 .  meloxicam (MOBIC) 15 MG tablet, Take 1 tablet (15 mg total) by mouth daily., Disp: 90 tablet, Rfl: 2 .  metFORMIN (GLUCOPHAGE) 1000 MG tablet, Take 1 tablet (1,000 mg total) by mouth 2 (two) times daily with a meal., Disp: 180 tablet, Rfl: 3 .  montelukast (SINGULAIR) 10 MG tablet, TAKE 1 TABLET BY MOUTH EVERY EVENING, Disp: 90 tablet, Rfl: 3 .  nystatin (MYCOSTATIN) 100000 UNIT/ML suspension, Take 5 mLs (500,000 Units total) by mouth 4 (four) times daily. Swish retain in mouth long as possible then swallow, Disp: 120 mL, Rfl: 0 .  theophylline (UNIPHYL) 400 MG 24 hr tablet, Take 200 mg by mouth 2 (two) times daily., Disp: , Rfl:  .  tiotropium (SPIRIVA HANDIHALER) 18 MCG inhalation capsule, Place into inhaler and inhale., Disp: , Rfl:  .  VENTOLIN HFA 108 (90 BASE) MCG/ACT inhaler, INHALE 2 PUFFS BY MOUTH EVERY 4 HOURS AS NEEDED (Patient not taking: Reported on 05/30/2015), Disp: 3 Inhaler, Rfl: 1  Past Surgical History  Procedure Laterality Date  . Breast surgery Left     Family History  Problem Relation Age of Onset  . Arthritis Mother   . Arthritis Father   . Cancer Sister     brain  . Heart disease Sister   . Stroke Sister   . Diabetes Brother     Allergies  Allergen Reactions  . Ciprofloxacin   . Inhaler Decongestant  [Methamphetamine]   . Molds & Smuts      Review of Systems  CONSTITUTIONAL: No significant weight changes, fever, chills, weakness or fatigue.  HEENT:  - Eyes: No visual changes.  - Ears: No auditory changes. No pain.  - Nose: No sneezing, congestion, runny nose. - Throat: No sore throat. No changes in swallowing. SKIN: No rash or itching.  CARDIOVASCULAR: No chest pain, chest pressure or chest  discomfort. No palpitations. Yes edema.  RESPIRATORY: No worsening shortness of breath, cough or sputum.  GASTROINTESTINAL: No anorexia, nausea, vomiting. No changes in bowel habits. No abdominal pain or blood.  NEUROLOGICAL: No headache, dizziness, syncope, paralysis, ataxia, numbness or tingling in the extremities. No memory changes. No change in bowel or bladder control.  MUSCULOSKELETAL: No joint pain. No muscle pain. HEMATOLOGIC: No anemia, bleeding or bruising.  LYMPHATICS: No enlarged lymph nodes.  PSYCHIATRIC: No change in mood. No change in sleep pattern.  ENDOCRINOLOGIC: No reports of sweating, cold or heat intolerance. No polyuria or polydipsia.     Objective  BP 138/76 mmHg  Pulse 95  Temp(Src) 97.5 F (36.4 C) (Oral)  Resp 18  Wt 181 lb 3.2 oz (82.192 kg)  SpO2 94% Body mass index is 30.15 kg/(m^2).   Ambulatory pulse oxygen 91% on room air.   Physical Exam  Constitutional: Patient appears well-developed and well-nourished. In no acute distress, smells of cigarette smoke as usual.  HEENT:  - Head: Normocephalic and atraumatic.  - Ears: RIGHT TM bulging with minimal clear exudate, LEFT TM bulging with minimal clear exudate.  - Nose: Nasal mucosa boggy and congested.  - Mouth/Throat: Oropharynx is moist with slight erythema of bilateral tonsils without hypertrophy or exudates. Post nasal drainage present.  - Eyes: Conjunctivae clear, EOM movements normal. PERRLA. No scleral icterus.  Neck: Normal range of motion. Neck supple. No JVD present. No thyromegaly present. No local lymphadenopathy. Cardiovascular: Normal rate, regular rhythm and normal heart sounds. No murmur heard.  Pulmonary/Chest: Effort normal with scattered end expiratory rhonchi and wheezing. No respiratory distress. Abdomen: Soft, NT/ND, Normal BS, no HSM.  Musculoskeletal: Normal range of motion bilateral UE and LE, no joint effusions. Peripheral vascular: Bilateral LE trace pedal  edema. Neurological: CN II-XII grossly intact with no focal deficits. Alert and oriented to person, place, and time. Coordination, balance, strength, speech and gait are normal but she does have resting general tremor.  Skin: Skin is warm and dry. No rash noted. No erythema.  Psychiatric: Patient has a normal mood and affect. Behavior is normal in office today. Judgment and thought content normal in office today.   Recent Results (from the past 2160 hour(s))  Urine Culture     Status: None   Collection Time: 04/17/15 12:00 AM  Result Value Ref Range   Urine Culture, Routine Final report    Urine Culture result 1 Comment     Comment: Mixed urogenital flora Less than 10,000 colonies/mL   POCT Urinalysis Dipstick     Status: Abnormal   Collection Time: 04/17/15 10:45 AM  Result Value Ref Range   Color, UA yellow    Clarity, UA clear    Glucose, UA negative    Bilirubin, UA negative    Ketones, UA negative    Spec Grav, UA 1.015    Blood, UA negative    pH, UA 6.0    Protein, UA trace    Urobilinogen, UA 0.2    Nitrite, UA negative    Leukocytes, UA moderate (2+) (A) Negative  CBC with Differential/Platelet     Status: None   Collection Time: 04/17/15 10:46 AM  Result Value Ref Range   WBC 7.3 3.4 - 10.8 x10E3/uL   RBC 4.38 3.77 - 5.28 x10E6/uL   Hemoglobin 12.2 11.1 - 15.9 g/dL   Hematocrit 37.6 34.0 - 46.6 %   MCV 86 79 - 97 fL   MCH 27.9 26.6 - 33.0 pg   MCHC 32.4 31.5 - 35.7 g/dL   RDW 14.5 12.3 - 15.4 %   Platelets 336 150 - 379 x10E3/uL   Neutrophils 54 %   Lymphs 32 %   Monocytes 10 %   Eos 2 %   Basos 1 %   Neutrophils Absolute 3.9 1.4 - 7.0 x10E3/uL   Lymphocytes Absolute 2.3 0.7 - 3.1 x10E3/uL   Monocytes Absolute 0.8 0.1 - 0.9 x10E3/uL   EOS (ABSOLUTE) 0.2 0.0 - 0.4 x10E3/uL   Basophils Absolute 0.0 0.0 - 0.2 x10E3/uL   Immature Granulocytes 1 %   Immature Grans (Abs) 0.1 0.0 - 0.1 x10E3/uL   NRBC 0 0-0 %  Comprehensive metabolic panel     Status:  Abnormal   Collection Time: 04/17/15 10:46 AM  Result Value Ref Range   Glucose 170 (H) 65 - 99 mg/dL   BUN 9 8 - 27 mg/dL   Creatinine, Ser 0.47 (L) 0.57 - 1.00 mg/dL   GFR calc non Af Amer 93 >59 mL/min/1.73   GFR calc Af Amer 107 >59 mL/min/1.73   BUN/Creatinine Ratio 19 11 - 26   Sodium 132 (L) 136 - 144 mmol/L   Potassium 4.0 3.5 - 5.2 mmol/L   Chloride 91 (L) 97 - 106 mmol/L   CO2 26 18 - 29 mmol/L   Calcium 9.8 8.7 - 10.3 mg/dL   Total Protein 7.0 6.0 - 8.5 g/dL   Albumin 4.7 3.5 - 4.7 g/dL   Globulin, Total 2.3 1.5 - 4.5 g/dL   Albumin/Globulin Ratio 2.0 1.1 - 2.5   Bilirubin Total 0.3 0.0 - 1.2 mg/dL   Alkaline Phosphatase 60 39 - 117 IU/L   AST 18 0 - 40 IU/L   ALT 21 0 - 32 IU/L  CBC with Differential     Status: Abnormal   Collection Time: 05/10/15 11:04 AM  Result Value Ref Range   WBC 4.4 3.6 - 11.0 K/uL   RBC 4.58 3.80 - 5.20 MIL/uL   Hemoglobin 12.4 12.0 - 16.0 g/dL   HCT 38.1 35.0 - 47.0 %   MCV 83.2 80.0 - 100.0 fL   MCH 27.1 26.0 - 34.0 pg   MCHC 32.6 32.0 - 36.0 g/dL   RDW 14.9 (H) 11.5 - 14.5 %   Platelets 327 150 - 440 K/uL   Neutrophils Relative % 76 %   Neutro Abs 3.4 1.4 - 6.5 K/uL   Lymphocytes Relative 18 %   Lymphs Abs 0.8 (L) 1.0 - 3.6 K/uL   Monocytes Relative 4 %   Monocytes Absolute 0.2 0.2 - 0.9 K/uL   Eosinophils Relative 1 %   Eosinophils Absolute 0.0 0 - 0.7 K/uL   Basophils Relative 1 %   Basophils Absolute 0.0 0 - 0.1 K/uL  Comprehensive metabolic panel     Status: Abnormal   Collection Time: 05/10/15 11:04 AM  Result Value Ref Range   Sodium 128 (L) 135 - 145 mmol/L   Potassium 3.9 3.5 - 5.1 mmol/L   Chloride 91 (L) 101 - 111 mmol/L   CO2 27 22 - 32 mmol/L   Glucose, Bld 264 (H) 65 - 99 mg/dL   BUN 9 6 - 20 mg/dL   Creatinine, Ser 0.59 0.44 - 1.00 mg/dL   Calcium 9.7 8.9 - 10.3 mg/dL   Total Protein 7.3 6.5 - 8.1 g/dL   Albumin 4.2 3.5 - 5.0 g/dL   AST 25 15 - 41 U/L   ALT 20 14 - 54 U/L   Alkaline Phosphatase 51 38 -  126 U/L   Total Bilirubin 0.4 0.3 - 1.2 mg/dL   GFR calc non Af Amer >60 >60 mL/min   GFR calc Af Amer >60 >60 mL/min    Comment: (NOTE) The eGFR has been calculated using the CKD EPI equation. This calculation has not been validated in all clinical situations. eGFR's persistently <60 mL/min signify possible Chronic Kidney Disease.    Anion gap 10 5 - 15  Protime-INR     Status: None   Collection Time: 05/10/15 11:04 AM  Result Value Ref Range   Prothrombin Time 12.8 11.4 - 15.0 seconds   INR 0.94   APTT     Status: None   Collection Time: 05/10/15 11:04 AM  Result Value Ref Range  aPTT 28 24 - 36 seconds  Type and screen [place]     Status: None   Collection Time: 05/10/15 11:05 AM  Result Value Ref Range   ABO/RH(D) A POS    Antibody Screen NEG    Sample Expiration 05/13/2015   ABO/Rh     Status: None   Collection Time: 05/10/15 11:06 AM  Result Value Ref Range   ABO/RH(D) A POS      Assessment & Plan  1. Hypertension goal BP (blood pressure) < 140/90 Stable.  - Ambulatory referral to Cardiology  2. Bilateral edema of lower extremity Not worse than usual on my exams but per patient worse at home some days before. May use lasix prn. However I want her to consult with cardiologist for possible heart failure diagnosis.  - furosemide (LASIX) 20 MG tablet; Take 20 mg by mouth once a day prn swelling of feet/legs  Dispense: 30 tablet; Refill: 2 - Ambulatory referral to Cardiology  3. Heavy cigarette smoker Has not quit or cut back.  - Ambulatory referral to Cardiology  4. COPD, severe [facility]) Not seeing a pulmonologist. Hand wrote Rx for portable oxygen backpack to fax to [facility]. Will need to await prior authorization or some form to explain why she needs Ventolin and not ProAir.  - Ambulatory referral to Cardiology

## 2015-06-01 ENCOUNTER — Other Ambulatory Visit: Payer: Self-pay | Admitting: Family Medicine

## 2015-06-25 ENCOUNTER — Ambulatory Visit: Payer: Medicare Other | Admitting: Family Medicine

## 2015-06-29 ENCOUNTER — Other Ambulatory Visit: Payer: Self-pay | Admitting: Family Medicine

## 2015-07-03 ENCOUNTER — Encounter: Payer: Self-pay | Admitting: Family Medicine

## 2015-07-03 ENCOUNTER — Ambulatory Visit (INDEPENDENT_AMBULATORY_CARE_PROVIDER_SITE_OTHER): Payer: Medicare Other | Admitting: Family Medicine

## 2015-07-03 ENCOUNTER — Ambulatory Visit: Payer: Medicare Other | Admitting: Family Medicine

## 2015-07-03 VITALS — BP 124/70 | HR 112 | Temp 98.6°F | Resp 18 | Wt 181.2 lb

## 2015-07-03 DIAGNOSIS — R51 Headache: Secondary | ICD-10-CM

## 2015-07-03 DIAGNOSIS — I872 Venous insufficiency (chronic) (peripheral): Secondary | ICD-10-CM

## 2015-07-03 DIAGNOSIS — I8312 Varicose veins of left lower extremity with inflammation: Secondary | ICD-10-CM | POA: Diagnosis not present

## 2015-07-03 DIAGNOSIS — I8311 Varicose veins of right lower extremity with inflammation: Secondary | ICD-10-CM | POA: Diagnosis not present

## 2015-07-03 DIAGNOSIS — E118 Type 2 diabetes mellitus with unspecified complications: Secondary | ICD-10-CM | POA: Diagnosis not present

## 2015-07-03 DIAGNOSIS — R519 Headache, unspecified: Secondary | ICD-10-CM

## 2015-07-03 DIAGNOSIS — R410 Disorientation, unspecified: Secondary | ICD-10-CM

## 2015-07-03 DIAGNOSIS — J449 Chronic obstructive pulmonary disease, unspecified: Secondary | ICD-10-CM | POA: Diagnosis not present

## 2015-07-03 LAB — POCT GLYCOSYLATED HEMOGLOBIN (HGB A1C): HEMOGLOBIN A1C: 7.5

## 2015-07-03 MED ORDER — METFORMIN HCL 1000 MG PO TABS: 1000.0000 mg | ORAL_TABLET | Freq: Two times a day (BID) | ORAL | Status: DC

## 2015-07-03 NOTE — Progress Notes (Signed)
Name: Jocelyn Burgess   MRN: [id number]    DOB: 1931-09-02   Date:07/03/2015       Progress Note  Subjective  Chief Complaint  Chief Complaint  Patient presents with  . Cough    shakiness, headaches and congestion  . Foot Problem    red spots on feet    HPI  Jocelyn Burgess is a 82 year old heavy smoker with moderate-severe COPD oxygen dependent at night and sometimes during to day who is here accompanied by her husband as usual with complaints of new red spots on her feet as well as further respiratory symptoms. Not associated with chest pain, worsening shortness of breath although she is always short of breath. Cardiac risk factors include DMII, HTN, HLD, Heavy cigarette smoker.  Regarding her COPD, she is having poor oxygenation during the day time too. Causing confusion intermittently. Occasional frontal headaches responds well to tylenol. Also they got a letter from her insurance previously that Ventolin is not on their formulary however she had severe intolerant reaction to ProAir: nausea, palpitations, shaking. May need PA forms.  Current oxygen company [employer].  Patient did consult with cardiologist but refused to move forward with echocardiogram.   Current diabetes medication regimen includes glimepiride 4 mg twice a day plus metformin 1000 mg twice a day. Jocelyn Burgess's blood glucose ranges 120-200. Patient is taking medications as instructed. Overall the patient feels that their blood glucose is well controled. Patient denies any related symptoms such as blurry vision, increased fatigue, polydipsia, polyphagia, polyuria, poor wound healing, pruritus, skin dryness and weight loss. Related medical conditions include Hyperlipidemia for which she is currently taking Lopid 600 mg twice a day and Lovastatin 20 mg once a day. Glycemic control is complicated by poor COPD control, poor compliance with smoking cessation efforts.   Past Medical History  Diagnosis Date  . Allergy   .  Cancer ([place])   . Diabetes mellitus without complication ([place])   . Arthritis   . Asthma   . Cataract   . COPD (chronic obstructive pulmonary disease) ([place])   . Depression     sometimes  . Hyperlipidemia   . Hypertension   . Oxygen deficiency     Patient Active Problem List   Diagnosis Date Noted  . Bilateral edema of lower extremity 05/30/2015  . Bleeding nose 05/08/2015  . Morning headache 05/01/2015  . Intermittent confusion 04/17/2015  . Cough 04/17/2015  . Constipation, slow transit 04/09/2015  . Allergic rhinitis with postnasal drip 04/09/2015  . Hypertension goal BP (blood pressure) < 140/90 01/22/2015  . Allergic rhinitis 11/23/2014  . Anxiety and depression 11/23/2014  . AB (asthmatic bronchitis) 11/23/2014  . Bronchitis with chronic airway obstruction ([place]) 11/23/2014  . Foot pain 11/23/2014  . Heavy cigarette smoker 11/23/2014  . COPD, severe ([place]) 11/23/2014  . DD (diverticular disease) 11/23/2014  . COPD with acute exacerbation ([place]) 11/23/2014  . Personal history of malignant neoplasm of breast 11/23/2014  . History of digestive disease 11/23/2014  . Personal history of healed traumatic fracture 11/23/2014  . HLD (hyperlipidemia) 11/23/2014  . Peripheral neuropathic pain ([place]) 11/23/2014  . At risk for falling 11/23/2014  . Diabetes mellitus type 2, controlled, with complications ([place]) 81/27/5170    Social History  Substance Use Topics  . Smoking status: Current Every Day Smoker -- 1.00 packs/day for 45 years    Types: Cigarettes  . Smokeless tobacco: Current User  . Alcohol Use: No     Current outpatient prescriptions:  .  aspirin 81 MG tablet, Take by mouth., Disp: , Rfl:  .  citalopram (CELEXA) 20 MG tablet, TAKE 1 TABLET (20 MG TOTAL) BY MOUTH DAILY., Disp: , Rfl: 3 .  cloNIDine (CATAPRES) 0.1 MG tablet, TAKE 1 TABLET BY MOUTH TWICE A DAY, Disp: 180 tablet, Rfl: 3 .  diazepam (VALIUM) 5 MG tablet, TAKE 1 TABLET BY MOUTH TWICE A DAY, Disp: 180  tablet, Rfl: 1 .  furosemide (LASIX) 20 MG tablet, Take 20 mg by mouth once a day prn swelling of feet/legs, Disp: 30 tablet, Rfl: 2 .  gabapentin (NEURONTIN) 300 MG capsule, TAKE 1 CAPSULE BY MOUTH 3 TIMES A DAY (Patient not taking: Reported on 04/17/2015), Disp: 270 capsule, Rfl: 3 .  gemfibrozil (LOPID) 600 MG tablet, TAKE 1 TABLET BY MOUTH TWICE A DAY, Disp: 180 tablet, Rfl: 3 .  glimepiride (AMARYL) 4 MG tablet, , Disp: , Rfl:  .  glucose blood (ACCU-CHEK AVIVA PLUS) test strip, Use as instructed to check blood sugar 3x/day, Disp: 100 each, Rfl: 5 .  hydrochlorothiazide (HYDRODIURIL) 25 MG tablet, TAKE 1 TABLET BY MOUTH DAILY, Disp: 90 tablet, Rfl: 2 .  HYDROcodone-acetaminophen (NORCO) 5-325 MG per tablet, Take by mouth., Disp: , Rfl:  .  loratadine (CLARITIN) 10 MG tablet, Take by mouth., Disp: , Rfl:  .  lovastatin (MEVACOR) 20 MG tablet, TAKE 1 TABLET BY MOUTH EVERY DAY WITH EVENING MEAL, Disp: 90 tablet, Rfl: 2 .  magnesium hydroxide (MILK OF MAGNESIA) 400 MG/5ML suspension, Take 5 mLs by mouth daily as needed., Disp: , Rfl:  .  meclizine (ANTIVERT) 25 MG tablet, TAKE 1 TABLET BY MOUTH EVERY 8 HOURS AS NEEDED, Disp: 200 tablet, Rfl: 1 .  meloxicam (MOBIC) 15 MG tablet, Take 1 tablet (15 mg total) by mouth daily., Disp: 90 tablet, Rfl: 2 .  metFORMIN (GLUCOPHAGE) 1000 MG tablet, Take 1 tablet (1,000 mg total) by mouth 2 (two) times daily with a meal., Disp: 180 tablet, Rfl: 3 .  metFORMIN (GLUCOPHAGE) 500 MG tablet, TAKE 1 TABLET BY MOUTH TWICE A DAY, Disp: 180 tablet, Rfl: 1 .  mometasone (ELOCON) 0.1 % lotion, PLACE 4 DROPS EACH EAR EVERY NIGHT AS NEEDED FOR DRY SKIN AND ITCHING, Disp: , Rfl: 12 .  montelukast (SINGULAIR) 10 MG tablet, TAKE 1 TABLET BY MOUTH EVERY EVENING, Disp: 90 tablet, Rfl: 3 .  nystatin (MYCOSTATIN) 100000 UNIT/ML suspension, Take 5 mLs (500,000 Units total) by mouth 4 (four) times daily. Swish retain in mouth long as possible then swallow, Disp: 120 mL, Rfl: 0 .   theophylline (UNIPHYL) 400 MG 24 hr tablet, Take 200 mg by mouth 2 (two) times daily., Disp: , Rfl:  .  tiotropium (SPIRIVA HANDIHALER) 18 MCG inhalation capsule, Place into inhaler and inhale., Disp: , Rfl:  .  VENTOLIN HFA 108 (90 BASE) MCG/ACT inhaler, INHALE 2 PUFFS BY MOUTH EVERY 4 HOURS AS NEEDED (Patient not taking: Reported on 05/30/2015), Disp: 3 Inhaler, Rfl: 1  Past Surgical History  Procedure Laterality Date  . Breast surgery Left     Family History  Problem Relation Age of Onset  . Arthritis Mother   . Arthritis Father   . Cancer Sister     brain  . Heart disease Sister   . Stroke Sister   . Diabetes Brother     Allergies  Allergen Reactions  . Ciprofloxacin   . Inhaler Decongestant  [Methamphetamine]   . Molds & Smuts   . Other Other (See Comments)  Review of Systems  CONSTITUTIONAL: No significant weight changes, fever, chills, weakness or fatigue.  HEENT:  - Eyes: No visual changes.  - Ears: No auditory changes. No pain.  - Nose: Chronic nasal congestion. - Throat: No sore throat. No changes in swallowing. SKIN: Yes rash CARDIOVASCULAR: No chest pain, chest pressure or chest discomfort. No palpitations or edema.  RESPIRATORY: Stable shortness of breath, cough and sputum.  GASTROINTESTINAL: No anorexia, nausea, vomiting. No changes in bowel habits. No abdominal pain or blood.  GENITOURINARY: No dysuria. No frequency. No discharge.  NEUROLOGICAL: Yes occasional headache. No dizziness, syncope, paralysis, ataxia, numbness or tingling in the extremities. No memory changes. No change in bowel or bladder control.  MUSCULOSKELETAL: No joint pain. No muscle pain. HEMATOLOGIC: No anemia, bleeding or bruising.  LYMPHATICS: No enlarged lymph nodes.  PSYCHIATRIC: No change in mood. No change in sleep pattern.  ENDOCRINOLOGIC: No reports of sweating, cold or heat intolerance. No polyuria or polydipsia.     Objective  BP 124/70 mmHg  Pulse 112  Temp(Src)  98.6 F (37 C)  Resp 18  Wt 181 lb 3 oz (82.186 kg)  SpO2 98% Body mass index is 30.15 kg/(m^2).  Physical Exam  Constitutional: Patient appears well-developed and well-nourished. In no acute distress, smells of cigarette smoke as usual.  Cardiovascular: Normal rate, regular rhythm and normal heart sounds. No murmur heard.  Pulmonary/Chest: Effort normal with reduced tidal volume. No respiratory distress. Musculoskeletal: Normal range of motion bilateral UE and LE, no joint effusions. Peripheral vascular: Bilateral LE resolved pedal edema. Some scattered red-brown hyperpigmentation on dorsum of feet without papules or scaling.  Neurological: CN II-XII grossly intact with no focal deficits. Alert and oriented to person, place, and time. Coordination, strength, speech and gait are normal but she does have resting general tremor and needs cane to walk with.  Skin: Skin is warm and dry. No rash noted. No erythema.  Psychiatric: Patient has a normal mood and affect. Behavior is normal in office today. Judgment and thought content normal in office today.   Recent Results (from the past 2160 hour(s))  Urine Culture     Status: None   Collection Time: 04/17/15 12:00 AM  Result Value Ref Range   Urine Culture, Routine Final report    Urine Culture result 1 Comment     Comment: Mixed urogenital flora Less than 10,000 colonies/mL   POCT Urinalysis Dipstick     Status: Abnormal   Collection Time: 04/17/15 10:45 AM  Result Value Ref Range   Color, UA yellow    Clarity, UA clear    Glucose, UA negative    Bilirubin, UA negative    Ketones, UA negative    Spec Grav, UA 1.015    Blood, UA negative    pH, UA 6.0    Protein, UA trace    Urobilinogen, UA 0.2    Nitrite, UA negative    Leukocytes, UA moderate (2+) (A) Negative  CBC with Differential/Platelet     Status: None   Collection Time: 04/17/15 10:46 AM  Result Value Ref Range   WBC 7.3 3.4 - 10.8 x10E3/uL   RBC 4.38 3.77 - 5.28  x10E6/uL   Hemoglobin 12.2 11.1 - 15.9 g/dL   Hematocrit 37.6 34.0 - 46.6 %   MCV 86 79 - 97 fL   MCH 27.9 26.6 - 33.0 pg   MCHC 32.4 31.5 - 35.7 g/dL   RDW 14.5 12.3 - 15.4 %   Platelets 336 150 -  379 x10E3/uL   Neutrophils 54 %   Lymphs 32 %   Monocytes 10 %   Eos 2 %   Basos 1 %   Neutrophils Absolute 3.9 1.4 - 7.0 x10E3/uL   Lymphocytes Absolute 2.3 0.7 - 3.1 x10E3/uL   Monocytes Absolute 0.8 0.1 - 0.9 x10E3/uL   EOS (ABSOLUTE) 0.2 0.0 - 0.4 x10E3/uL   Basophils Absolute 0.0 0.0 - 0.2 x10E3/uL   Immature Granulocytes 1 %   Immature Grans (Abs) 0.1 0.0 - 0.1 x10E3/uL   NRBC 0 0-0 %  Comprehensive metabolic panel     Status: Abnormal   Collection Time: 04/17/15 10:46 AM  Result Value Ref Range   Glucose 170 (H) 65 - 99 mg/dL   BUN 9 8 - 27 mg/dL   Creatinine, Ser 0.47 (L) 0.57 - 1.00 mg/dL   GFR calc non Af Amer 93 >59 mL/min/1.73   GFR calc Af Amer 107 >59 mL/min/1.73   BUN/Creatinine Ratio 19 11 - 26   Sodium 132 (L) 136 - 144 mmol/L   Potassium 4.0 3.5 - 5.2 mmol/L   Chloride 91 (L) 97 - 106 mmol/L   CO2 26 18 - 29 mmol/L   Calcium 9.8 8.7 - 10.3 mg/dL   Total Protein 7.0 6.0 - 8.5 g/dL   Albumin 4.7 3.5 - 4.7 g/dL   Globulin, Total 2.3 1.5 - 4.5 g/dL   Albumin/Globulin Ratio 2.0 1.1 - 2.5   Bilirubin Total 0.3 0.0 - 1.2 mg/dL   Alkaline Phosphatase 60 39 - 117 IU/L   AST 18 0 - 40 IU/L   ALT 21 0 - 32 IU/L  CBC with Differential     Status: Abnormal   Collection Time: 05/10/15 11:04 AM  Result Value Ref Range   WBC 4.4 3.6 - 11.0 K/uL   RBC 4.58 3.80 - 5.20 MIL/uL   Hemoglobin 12.4 12.0 - 16.0 g/dL   HCT 38.1 35.0 - 47.0 %   MCV 83.2 80.0 - 100.0 fL   MCH 27.1 26.0 - 34.0 pg   MCHC 32.6 32.0 - 36.0 g/dL   RDW 14.9 (H) 11.5 - 14.5 %   Platelets 327 150 - 440 K/uL   Neutrophils Relative % 76 %   Neutro Abs 3.4 1.4 - 6.5 K/uL   Lymphocytes Relative 18 %   Lymphs Abs 0.8 (L) 1.0 - 3.6 K/uL   Monocytes Relative 4 %   Monocytes Absolute 0.2 0.2 - 0.9 K/uL    Eosinophils Relative 1 %   Eosinophils Absolute 0.0 0 - 0.7 K/uL   Basophils Relative 1 %   Basophils Absolute 0.0 0 - 0.1 K/uL  Comprehensive metabolic panel     Status: Abnormal   Collection Time: 05/10/15 11:04 AM  Result Value Ref Range   Sodium 128 (L) 135 - 145 mmol/L   Potassium 3.9 3.5 - 5.1 mmol/L   Chloride 91 (L) 101 - 111 mmol/L   CO2 27 22 - 32 mmol/L   Glucose, Bld 264 (H) 65 - 99 mg/dL   BUN 9 6 - 20 mg/dL   Creatinine, Ser 0.59 0.44 - 1.00 mg/dL   Calcium 9.7 8.9 - 10.3 mg/dL   Total Protein 7.3 6.5 - 8.1 g/dL   Albumin 4.2 3.5 - 5.0 g/dL   AST 25 15 - 41 U/L   ALT 20 14 - 54 U/L   Alkaline Phosphatase 51 38 - 126 U/L   Total Bilirubin 0.4 0.3 - 1.2 mg/dL   GFR calc non  Af Amer >60 >60 mL/min   GFR calc Af Amer >60 >60 mL/min    Comment: (NOTE) The eGFR has been calculated using the CKD EPI equation. This calculation has not been validated in all clinical situations. eGFR's persistently <60 mL/min signify possible Chronic Kidney Disease.    Anion gap 10 5 - 15  Protime-INR     Status: None   Collection Time: 05/10/15 11:04 AM  Result Value Ref Range   Prothrombin Time 12.8 11.4 - 15.0 seconds   INR 0.94   APTT     Status: None   Collection Time: 05/10/15 11:04 AM  Result Value Ref Range   aPTT 28 24 - 36 seconds  Type and screen [place]     Status: None   Collection Time: 05/10/15 11:05 AM  Result Value Ref Range   ABO/RH(D) A POS    Antibody Screen NEG    Sample Expiration 05/13/2015   ABO/Rh     Status: None   Collection Time: 05/10/15 11:06 AM  Result Value Ref Range   ABO/RH(D) A POS    Results for orders placed or performed in visit on 07/03/15 (from the past 24 hour(s))  POCT HgB A1C     Status: Abnormal   Collection Time: 07/03/15  8:44 AM  Result Value Ref Range   Hemoglobin A1C 7.5     Assessment & Plan  1. COPD, severe ([place]) Patient refused to be referred to a pulmonologist. I feel that her headaches,  dizziness, SOB may be pulmonary and cardiac in etiology but she is refusing further work up.  2. Controlled type 2 diabetes mellitus with complication, without long-term current use of insulin (HCC)  Patient's Hba1c goal is <8% as this is reasonable for the elderly population who is at risk for hypoglycemic events.   Encouraged patient to continue efforts on checking blood glucose on a daily basis. Fasting blood glucose control goal is 120-172m/dL and post prandial blood glucose control is <185mdL.  Reviewed diet, exercise, lifestyle changes and current medication regimen pertaining to diabetes with the patient.   Reminded patient of the required annual dilated retinal exam.    - metFORMIN (GLUCOPHAGE) 1000 MG tablet; Take 1 tablet (1,000 mg total) by mouth 2 (two) times daily with a meal.  Dispense: 180 tablet; Refill: 1 - POCT HgB A1C  3. Morning headache Likely oxygen related. Continue tylenol prn.   4. Intermittent confusion Likely oxygen related.   5. Venous stasis dermatitis of both lower extremities Monitor rash.

## 2015-07-04 ENCOUNTER — Other Ambulatory Visit: Payer: Self-pay | Admitting: Family Medicine

## 2015-07-05 ENCOUNTER — Ambulatory Visit (INDEPENDENT_AMBULATORY_CARE_PROVIDER_SITE_OTHER): Payer: Medicare Other

## 2015-07-05 ENCOUNTER — Ambulatory Visit
Admission: EM | Admit: 2015-07-05 | Discharge: 2015-07-05 | Disposition: A | Discharge status: Home / Self Care | Payer: Medicare Other | Attending: Family Medicine | Admitting: Family Medicine

## 2015-07-05 ENCOUNTER — Encounter: Payer: Self-pay | Admitting: Emergency Medicine

## 2015-07-05 DIAGNOSIS — W19XXXA Unspecified fall, initial encounter: Secondary | ICD-10-CM | POA: Diagnosis not present

## 2015-07-05 DIAGNOSIS — M79605 Pain in left leg: Secondary | ICD-10-CM

## 2015-07-05 NOTE — ED Notes (Signed)
Pt states she fell this am and injured her left hip.

## 2015-07-05 NOTE — ED Provider Notes (Signed)
CSN: [id number]     Arrival date & time 07/05/15  1123 History   First MD Initiated Contact with Patient 07/05/15 1138       Nurses notes were reviewed.  Chief Complaint  Patient presents with  . Hip Pain    Patient fell today landing on her left hip and left leg. She states that she does have a history of back problems with the backstop bothering her now. She has a history of rest cancer and she does smoke heavily states that she normally uses a walker to ambulate and she still having trouble and relating walking. According to husband she has used a walker all the time. Patient also reports having trouble with vertigo and states she's been diagnosed with vertigo and they told her that is not much they can do so normal she stands and walks times seems a walker or as assistance when she walks this time she had nothing to grab hold onto. (Consider location/radiation/quality/duration/timing/severity/associated sxs/prior Treatment) Patient is a 82 y.o. female presenting with hip pain. The history is provided by the patient. No language interpreter was used.  Hip Pain This is a new problem. The current episode started 1 to 2 hours ago. The problem has not changed since onset.Pertinent negatives include no chest pain, no abdominal pain, no headaches and no shortness of breath. The symptoms are aggravated by walking. Nothing relieves the symptoms. She has tried nothing for the symptoms. The treatment provided no relief.    Past Medical History  Diagnosis Date  . Allergy   . Cancer ([place])   . Diabetes mellitus without complication ([place])   . Arthritis   . Asthma   . Cataract   . COPD (chronic obstructive pulmonary disease) ([place])   . Depression     sometimes  . Hyperlipidemia   . Hypertension   . Oxygen deficiency    Past Surgical History  Procedure Laterality Date  . Breast surgery Left    Family History  Problem Relation Age of Onset  . Arthritis Mother   . Arthritis Father   . Cancer  Sister     brain  . Heart disease Sister   . Stroke Sister   . Diabetes Brother    Social History  Substance Use Topics  . Smoking status: Current Every Day Smoker -- 1.00 packs/day for 45 years    Types: Cigarettes  . Smokeless tobacco: Current User  . Alcohol Use: No   OB History    No data available     Review of Systems  Constitutional: Negative.   Respiratory: Negative for shortness of breath.   Cardiovascular: Negative for chest pain.  Gastrointestinal: Negative for abdominal pain.  Musculoskeletal: Positive for myalgias, arthralgias and gait problem.  Neurological: Negative for headaches.  All other systems reviewed and are negative.   Allergies  Ciprofloxacin; Inhaler decongestant ; Molds & smuts; and Other  Home Medications   Prior to Admission medications   Medication Sig Start Date End Date Taking? Authorizing Provider  aspirin 81 MG tablet Take by mouth.    Historical Provider, MD  citalopram (CELEXA) 20 MG tablet TAKE 1 TABLET (20 MG TOTAL) BY MOUTH DAILY. 01/22/15   Historical Provider, MD  cloNIDine (CATAPRES) 0.1 MG tablet TAKE 1 TABLET BY MOUTH TWICE A DAY 01/11/15   Bobetta Lime, MD  diazepam (VALIUM) 5 MG tablet TAKE 1 TABLET BY MOUTH TWICE A DAY 04/02/15   Bobetta Lime, MD  furosemide (LASIX) 20 MG tablet Take 20 mg  by mouth once a day prn swelling of feet/legs 05/30/15   Bobetta Lime, MD  gabapentin (NEURONTIN) 300 MG capsule TAKE 1 CAPSULE BY MOUTH 3 TIMES A DAY Patient not taking: Reported on 04/17/2015 04/09/15   Bobetta Lime, MD  gemfibrozil (LOPID) 600 MG tablet TAKE 1 TABLET BY MOUTH TWICE A DAY 01/11/15   Bobetta Lime, MD  glimepiride (AMARYL) 4 MG tablet  04/27/15   Historical Provider, MD  glucose blood (ACCU-CHEK AVIVA PLUS) test strip Use as instructed to check blood sugar 3x/day 12/13/14   Bobetta Lime, MD  hydrochlorothiazide (HYDRODIURIL) 25 MG tablet TAKE 1 TABLET BY MOUTH DAILY 12/21/14   Bobetta Lime, MD    HYDROcodone-acetaminophen (NORCO) 5-325 MG per tablet Take by mouth.    Historical Provider, MD  loratadine (CLARITIN) 10 MG tablet Take by mouth. 10/05/14   Historical Provider, MD  lovastatin (MEVACOR) 20 MG tablet TAKE 1 TABLET BY MOUTH EVERY DAY WITH EVENING MEAL 06/04/15   Bobetta Lime, MD  magnesium hydroxide (MILK OF MAGNESIA) 400 MG/5ML suspension Take 5 mLs by mouth daily as needed.    Historical Provider, MD  meclizine (ANTIVERT) 25 MG tablet TAKE 1 TABLET BY MOUTH EVERY 8 HOURS AS NEEDED 01/22/15   Bobetta Lime, MD  meloxicam (MOBIC) 15 MG tablet Take 1 tablet (15 mg total) by mouth daily. 05/01/15   Bobetta Lime, MD  metFORMIN (GLUCOPHAGE) 1000 MG tablet Take 1 tablet (1,000 mg total) by mouth 2 (two) times daily with a meal. 07/03/15   Bobetta Lime, MD  mometasone (ELOCON) 0.1 % lotion PLACE 4 DROPS EACH EAR EVERY NIGHT AS NEEDED FOR DRY SKIN AND ITCHING 05/30/15   Historical Provider, MD  montelukast (SINGULAIR) 10 MG tablet TAKE 1 TABLET BY MOUTH EVERY EVENING 12/31/14   Bobetta Lime, MD  nystatin (MYCOSTATIN) 100000 UNIT/ML suspension Take 5 mLs (500,000 Units total) by mouth 4 (four) times daily. Swish retain in mouth long as possible then swallow 05/23/15   Bobetta Lime, MD  SPIRIVA HANDIHALER 18 MCG inhalation capsule INHALE CONTENTS OF 1 CAPSULE ONCE DAILY 07/04/15   Bobetta Lime, MD  theophylline (UNIPHYL) 400 MG 24 hr tablet Take 200 mg by mouth 2 (two) times daily.    Historical Provider, MD  VENTOLIN HFA 108 (90 BASE) MCG/ACT inhaler INHALE 2 PUFFS BY MOUTH EVERY 4 HOURS AS NEEDED Patient not taking: Reported on 05/30/2015 05/16/15   Bobetta Lime, MD   Meds Ordered and Administered this Visit  Medications - No data to display  BP 140/60 mmHg  Pulse 98  Temp(Src) 97.8 F (36.6 C) (Oral)  Resp 20  Ht 5\' 5"  (1.651 m)  Wt 181 lb (82.101 kg)  BMI 30.12 kg/m2  SpO2 95% No data found.   Physical Exam  Constitutional: She is oriented to person, place,  and time. She appears well-nourished. She is active.  Non-toxic appearance. She does not have a sickly appearance.  Elderly white female sitting in wheelchair and a heavy smells smoke over her  HENT:  Head: Normocephalic.  Eyes: Conjunctivae are normal. Pupils are equal, round, and reactive to light.  Musculoskeletal: She exhibits tenderness.       Left hip: She exhibits tenderness.       Legs: Patient bruising over the left hipand continues over the back patient has difficulty standing and ambulating on the left leg.  Neurological: She is alert and oriented to person, place, and time.  Skin: Skin is warm.  Psychiatric: She has a normal mood and affect.  Vitals reviewed.   ED Course  Procedures (including critical care time)  Labs Review Labs Reviewed - No data to display  Imaging Review Dg Hip Unilat With Pelvis 2-3 Views Left  07/05/2015  CLINICAL DATA:  Left hip pain following a fall today. EXAM: DG HIP (WITH OR WITHOUT PELVIS) 2-3V LEFT COMPARISON:  None. FINDINGS: Diffuse osteopenia. Lower lumbar spine degenerative changes. Atheromatous arterial calcifications. No fracture or dislocation seen. IMPRESSION: No fracture or dislocation. Electronically Signed   By: Claudie Revering M.D.   On: 07/05/2015 12:43   Dg Femur Min 2 Views Left  07/05/2015  CLINICAL DATA:  Left hip and proximal leg pain after falling today. EXAM: LEFT FEMUR 2 VIEWS COMPARISON:  None. FINDINGS: Diffuse osteopenia. Left knee degenerative changes. Atheromatous arterial calcifications. No fracture or dislocation seen. IMPRESSION: No fracture or dislocation. Electronically Signed   By: Claudie Revering M.D.   On: 07/05/2015 12:40     Visual Acuity Review  Right Eye Distance:   Left Eye Distance:   Bilateral Distance:    Right Eye Near:   Left Eye Near:    Bilateral Near:         MDM   1. Fall, initial encounter   2. Leg pain, left    Patient has been informed no signs of fracture or recommend icing the  hip follow-up PCP as needed. Plan to continue using walker home.  Frederich Cha, MD 07/05/15 1259

## 2015-07-05 NOTE — Discharge Instructions (Signed)
Cryotherapy Cryotherapy is when you put ice on your injury. Ice helps lessen pain and puffiness (swelling) after an injury. Ice works the best when you start using it in the first 24 to 48 hours after an injury. HOME CARE  Put a dry or damp towel between the ice pack and your skin.  You may press gently on the ice pack.  Leave the ice on for no more than 10 to 20 minutes at a time.  Check your skin after 5 minutes to make sure your skin is okay.  Rest at least 20 minutes between ice pack uses.  Stop using ice when your skin loses feeling (numbness).  Do not use ice on someone who cannot tell you when it hurts. This includes small children and people with memory problems (dementia). GET HELP RIGHT AWAY IF:  You have white spots on your skin.  Your skin turns blue or pale.  Your skin feels waxy or hard.  Your puffiness gets worse. MAKE SURE YOU:   Understand these instructions.  Will watch your condition.  Will get help right away if you are not doing well or get worse.   This information is not intended to replace advice given to you by your health care provider. Make sure you discuss any questions you have with your health care provider.   Document Released: 11/19/2007 Document Revised: 08/25/2011 Document Reviewed: 01/23/2011 Elsevier Interactive Patient Education 2016 Elsevier Inc.  Musculoskeletal Pain Musculoskeletal pain is muscle and boney aches and pains. These pains can occur in any part of the body. Your caregiver may treat you without knowing the cause of the pain. They may treat you if blood or urine tests, X-rays, and other tests were normal.  CAUSES There is often not a definite cause or reason for these pains. These pains may be caused by a type of germ (virus). The discomfort may also come from overuse. Overuse includes working out too hard when your body is not fit. Boney aches also come from weather changes. Bone is sensitive to atmospheric pressure  changes. HOME CARE INSTRUCTIONS   Ask when your test results will be ready. Make sure you get your test results.  Only take over-the-counter or prescription medicines for pain, discomfort, or fever as directed by your caregiver. If you were given medications for your condition, do not drive, operate machinery or power tools, or sign legal documents for 24 hours. Do not drink alcohol. Do not take sleeping pills or other medications that may interfere with treatment.  Continue all activities unless the activities cause more pain. When the pain lessens, slowly resume normal activities. Gradually increase the intensity and duration of the activities or exercise.  During periods of severe pain, bed rest may be helpful. Lay or sit in any position that is comfortable.  Putting ice on the injured area.  Put ice in a bag.  Place a towel between your skin and the bag.  Leave the ice on for 15 to 20 minutes, 3 to 4 times a day.  Follow up with your caregiver for continued problems and no reason can be found for the pain. If the pain becomes worse or does not go away, it may be necessary to repeat tests or do additional testing. Your caregiver may need to look further for a possible cause. SEEK IMMEDIATE MEDICAL CARE IF:  You have pain that is getting worse and is not relieved by medications.  You develop chest pain that is associated with shortness or  breath, sweating, feeling sick to your stomach (nauseous), or throw up (vomit).  Your pain becomes localized to the abdomen.  You develop any new symptoms that seem different or that concern you. MAKE SURE YOU:   Understand these instructions.  Will watch your condition.  Will get help right away if you are not doing well or get worse.   This information is not intended to replace advice given to you by your health care provider. Make sure you discuss any questions you have with your health care provider.   Document Released: 06/02/2005  Document Revised: 08/25/2011 Document Reviewed: 02/04/2013 Elsevier Interactive Patient Education [employer].

## 2015-07-09 ENCOUNTER — Encounter: Payer: Self-pay | Admitting: Family Medicine

## 2015-07-09 ENCOUNTER — Ambulatory Visit (INDEPENDENT_AMBULATORY_CARE_PROVIDER_SITE_OTHER): Payer: Medicare Other | Admitting: Family Medicine

## 2015-07-09 VITALS — BP 120/58 | HR 103 | Temp 98.9°F | Resp 18 | Wt 182.0 lb

## 2015-07-09 DIAGNOSIS — M25552 Pain in left hip: Secondary | ICD-10-CM | POA: Insufficient documentation

## 2015-07-09 DIAGNOSIS — G250 Essential tremor: Secondary | ICD-10-CM

## 2015-07-09 DIAGNOSIS — Z9181 History of falling: Secondary | ICD-10-CM | POA: Diagnosis not present

## 2015-07-09 DIAGNOSIS — F3342 Major depressive disorder, recurrent, in full remission: Secondary | ICD-10-CM | POA: Diagnosis not present

## 2015-07-09 MED ORDER — DIAZEPAM 5 MG PO TABS: 5.0000 mg | ORAL_TABLET | Freq: Three times a day (TID) | ORAL | Status: DC | PRN

## 2015-07-09 MED ORDER — HYDROCODONE-ACETAMINOPHEN 5-325 MG PO TABS: 1.0000 | ORAL_TABLET | Freq: Two times a day (BID) | ORAL | Status: DC | PRN

## 2015-07-09 NOTE — Progress Notes (Signed)
Name: Jocelyn Burgess   MRN: [id number]    DOB: 1931-10-28   Date:07/09/2015       Progress Note  Subjective  Chief Complaint  Chief Complaint  Patient presents with  . Fall    fell 1/20 at home has a brusie on left hip.  Went to urgent care, they did x-rays everything came back normal.  Still very sore    HPI  Jocelyn Burgess is a 82 year old female with COPD moderate-severe oxygen dependent at night and occasionally during daytime, continues to smoke cigarettes, hx breast cancer, vertigo, DM II, Arthritis, Depression, HTN, HLD. Today she is here for follow up of UC visit after having an accidental fall at her home on 07/06/15. Fell to the left side onto her hip. X-rays done at the time did not show any fractures or acute changes she was told. Still having some pain which is what is limiting her ambulation. She is in a wheel chair today. Jocelyn Burgess is also requesting refill of her diazepam with increased freq to tid dosing to help with her resting tremors. She denies somnolence with this medication.   Past Medical History  Diagnosis Date  . Allergy   . Cancer ([place])   . Diabetes mellitus without complication ([place])   . Arthritis   . Asthma   . Cataract   . COPD (chronic obstructive pulmonary disease) ([place])   . Depression     sometimes  . Hyperlipidemia   . Hypertension   . Oxygen deficiency     Patient Active Problem List   Diagnosis Date Noted  . Venous stasis dermatitis of both lower extremities 07/03/2015  . Bilateral edema of lower extremity 05/30/2015  . Bleeding nose 05/08/2015  . Morning headache 05/01/2015  . Intermittent confusion 04/17/2015  . Cough 04/17/2015  . Constipation, slow transit 04/09/2015  . Allergic rhinitis with postnasal drip 04/09/2015  . Hypertension goal BP (blood pressure) < 140/90 01/22/2015  . Allergic rhinitis 11/23/2014  . Anxiety and depression 11/23/2014  . AB (asthmatic bronchitis) 11/23/2014  . Bronchitis with chronic airway obstruction ([place])  11/23/2014  . Foot pain 11/23/2014  . Heavy cigarette smoker 11/23/2014  . COPD, severe ([place]) 11/23/2014  . DD (diverticular disease) 11/23/2014  . COPD with acute exacerbation ([place]) 11/23/2014  . Personal history of malignant neoplasm of breast 11/23/2014  . History of digestive disease 11/23/2014  . Personal history of healed traumatic fracture 11/23/2014  . HLD (hyperlipidemia) 11/23/2014  . Peripheral neuropathic pain ([place]) 11/23/2014  . At risk for falling 11/23/2014  . Diabetes mellitus type 2, controlled, with complications ([place]) 67/61/9509    Social History  Substance Use Topics  . Smoking status: Current Every Day Smoker -- 1.00 packs/day for 45 years    Types: Cigarettes  . Smokeless tobacco: Current User  . Alcohol Use: No     Current outpatient prescriptions:  .  aspirin 81 MG tablet, Take by mouth., Disp: , Rfl:  .  citalopram (CELEXA) 20 MG tablet, TAKE 1 TABLET (20 MG TOTAL) BY MOUTH DAILY., Disp: , Rfl: 3 .  cloNIDine (CATAPRES) 0.1 MG tablet, TAKE 1 TABLET BY MOUTH TWICE A DAY, Disp: 180 tablet, Rfl: 3 .  diazepam (VALIUM) 5 MG tablet, TAKE 1 TABLET BY MOUTH TWICE A DAY, Disp: 180 tablet, Rfl: 1 .  furosemide (LASIX) 20 MG tablet, Take 20 mg by mouth once a day prn swelling of feet/legs, Disp: 30 tablet, Rfl: 2 .  gabapentin (NEURONTIN) 300 MG capsule, TAKE  1 CAPSULE BY MOUTH 3 TIMES A DAY (Patient not taking: Reported on 04/17/2015), Disp: 270 capsule, Rfl: 3 .  gemfibrozil (LOPID) 600 MG tablet, TAKE 1 TABLET BY MOUTH TWICE A DAY, Disp: 180 tablet, Rfl: 3 .  glimepiride (AMARYL) 4 MG tablet, , Disp: , Rfl:  .  glucose blood (ACCU-CHEK AVIVA PLUS) test strip, Use as instructed to check blood sugar 3x/day, Disp: 100 each, Rfl: 5 .  hydrochlorothiazide (HYDRODIURIL) 25 MG tablet, TAKE 1 TABLET BY MOUTH DAILY, Disp: 90 tablet, Rfl: 2 .  HYDROcodone-acetaminophen (NORCO) 5-325 MG per tablet, Take by mouth., Disp: , Rfl:  .  loratadine (CLARITIN) 10 MG tablet, Take by  mouth., Disp: , Rfl:  .  lovastatin (MEVACOR) 20 MG tablet, TAKE 1 TABLET BY MOUTH EVERY DAY WITH EVENING MEAL, Disp: 90 tablet, Rfl: 2 .  magnesium hydroxide (MILK OF MAGNESIA) 400 MG/5ML suspension, Take 5 mLs by mouth daily as needed., Disp: , Rfl:  .  meclizine (ANTIVERT) 25 MG tablet, TAKE 1 TABLET BY MOUTH EVERY 8 HOURS AS NEEDED, Disp: 200 tablet, Rfl: 1 .  meloxicam (MOBIC) 15 MG tablet, Take 1 tablet (15 mg total) by mouth daily., Disp: 90 tablet, Rfl: 2 .  metFORMIN (GLUCOPHAGE) 1000 MG tablet, Take 1 tablet (1,000 mg total) by mouth 2 (two) times daily with a meal., Disp: 180 tablet, Rfl: 1 .  mometasone (ELOCON) 0.1 % lotion, PLACE 4 DROPS EACH EAR EVERY NIGHT AS NEEDED FOR DRY SKIN AND ITCHING, Disp: , Rfl: 12 .  montelukast (SINGULAIR) 10 MG tablet, TAKE 1 TABLET BY MOUTH EVERY EVENING, Disp: 90 tablet, Rfl: 3 .  nystatin (MYCOSTATIN) 100000 UNIT/ML suspension, Take 5 mLs (500,000 Units total) by mouth 4 (four) times daily. Swish retain in mouth long as possible then swallow, Disp: 120 mL, Rfl: 0 .  SPIRIVA HANDIHALER 18 MCG inhalation capsule, INHALE CONTENTS OF 1 CAPSULE ONCE DAILY, Disp: 90 capsule, Rfl: 2 .  theophylline (UNIPHYL) 400 MG 24 hr tablet, Take 200 mg by mouth 2 (two) times daily., Disp: , Rfl:  .  VENTOLIN HFA 108 (90 BASE) MCG/ACT inhaler, INHALE 2 PUFFS BY MOUTH EVERY 4 HOURS AS NEEDED (Patient not taking: Reported on 05/30/2015), Disp: 3 Inhaler, Rfl: 1  Past Surgical History  Procedure Laterality Date  . Breast surgery Left     Family History  Problem Relation Age of Onset  . Arthritis Mother   . Arthritis Father   . Cancer Sister     brain  . Heart disease Sister   . Stroke Sister   . Diabetes Brother     Allergies  Allergen Reactions  . Ciprofloxacin   . Inhaler Decongestant  [Methamphetamine]   . Molds & Smuts   . Other Other (See Comments)     Review of Systems  CONSTITUTIONAL: No significant weight changes, fever, chills, weakness or  fatigue.  HEENT:  - Eyes: No visual changes.  - Ears: No auditory changes. No pain.  - Nose: Chronic nasal congestion. - Throat: No sore throat. No changes in swallowing. SKIN: Yes rash CARDIOVASCULAR: No chest pain, chest pressure or chest discomfort. No palpitations or edema.  RESPIRATORY: Stable shortness of breath, cough and sputum.  GASTROINTESTINAL: No anorexia, nausea, vomiting. No changes in bowel habits. No abdominal pain or blood.  GENITOURINARY: No dysuria. No frequency. No discharge.  NEUROLOGICAL: Yes occasional headache. No dizziness, syncope, paralysis, ataxia, numbness or tingling in the extremities. No memory changes. No change in bowel or bladder control.  MUSCULOSKELETAL: Yes joint pain. No muscle pain. HEMATOLOGIC: No anemia, bleeding or bruising.  LYMPHATICS: No enlarged lymph nodes.  PSYCHIATRIC: No change in mood. No change in sleep pattern.  ENDOCRINOLOGIC: No reports of sweating, cold or heat intolerance. No polyuria or polydipsia.   Objective  BP 120/58 mmHg  Pulse 103  Temp(Src) 98.9 F (37.2 C) (Oral)  Resp 18  Wt 182 lb (82.555 kg)  SpO2 93% Body mass index is 30.29 kg/(m^2).  Physical Exam  Constitutional: Patient appears well-developed and well-nourished. In no acute distress, smells of cigarette smoke as usual.  Cardiovascular: Normal rate, regular rhythm and normal heart sounds. No murmur heard.  Pulmonary/Chest: Effort normal with baseline reduced tidal volume. No respiratory distress. Musculoskeletal: Normal range of motion bilateral UE and LE, no joint effusions. [place]ble to stand up and bear weight on both legs alternating with assistance for balance. Left hip and upper femur area no bruising on exam. Full ROM of left hip w/o worsening pain. Strength bilateral LE 5/5, strength bilateral UE 5/5.  Peripheral vascular: Bilateral LE resolved pedal edema. Some scattered red-brown hyperpigmentation on dorsum of feet without papules or  scaling.  Neurological: CN II-XII grossly intact with no focal deficits. Alert and oriented to person, place, and time. Coordination, strength, speech and gait are normal but she does have resting general tremor and needs cane to walk with.  Skin: Skin is warm and dry. No rash noted. No erythema.  Psychiatric: Patient has a normal mood and affect. Behavior is normal in office today. Judgment and thought content normal in office today.  Recent Results (from the past 2160 hour(s))  Urine Culture     Status: None   Collection Time: 04/17/15 12:00 AM  Result Value Ref Range   Urine Culture, Routine Final report    Urine Culture result 1 Comment     Comment: Mixed urogenital flora Less than 10,000 colonies/mL   POCT Urinalysis Dipstick     Status: Abnormal   Collection Time: 04/17/15 10:45 AM  Result Value Ref Range   Color, UA yellow    Clarity, UA clear    Glucose, UA negative    Bilirubin, UA negative    Ketones, UA negative    Spec Grav, UA 1.015    Blood, UA negative    pH, UA 6.0    Protein, UA trace    Urobilinogen, UA 0.2    Nitrite, UA negative    Leukocytes, UA moderate (2+) (A) Negative  CBC with Differential/Platelet     Status: None   Collection Time: 04/17/15 10:46 AM  Result Value Ref Range   WBC 7.3 3.4 - 10.8 x10E3/uL   RBC 4.38 3.77 - 5.28 x10E6/uL   Hemoglobin 12.2 11.1 - 15.9 g/dL   Hematocrit 37.6 34.0 - 46.6 %   MCV 86 79 - 97 fL   MCH 27.9 26.6 - 33.0 pg   MCHC 32.4 31.5 - 35.7 g/dL   RDW 14.5 12.3 - 15.4 %   Platelets 336 150 - 379 x10E3/uL   Neutrophils 54 %   Lymphs 32 %   Monocytes 10 %   Eos 2 %   Basos 1 %   Neutrophils Absolute 3.9 1.4 - 7.0 x10E3/uL   Lymphocytes Absolute 2.3 0.7 - 3.1 x10E3/uL   Monocytes Absolute 0.8 0.1 - 0.9 x10E3/uL   EOS (ABSOLUTE) 0.2 0.0 - 0.4 x10E3/uL   Basophils Absolute 0.0 0.0 - 0.2 x10E3/uL   Immature Granulocytes 1 %   Immature Grans (Abs)  0.1 0.0 - 0.1 x10E3/uL   NRBC 0 0-0 %  Comprehensive metabolic  panel     Status: Abnormal   Collection Time: 04/17/15 10:46 AM  Result Value Ref Range   Glucose 170 (H) 65 - 99 mg/dL   BUN 9 8 - 27 mg/dL   Creatinine, Ser 0.47 (L) 0.57 - 1.00 mg/dL   GFR calc non Af Amer 93 >59 mL/min/1.73   GFR calc Af Amer 107 >59 mL/min/1.73   BUN/Creatinine Ratio 19 11 - 26   Sodium 132 (L) 136 - 144 mmol/L   Potassium 4.0 3.5 - 5.2 mmol/L   Chloride 91 (L) 97 - 106 mmol/L   CO2 26 18 - 29 mmol/L   Calcium 9.8 8.7 - 10.3 mg/dL   Total Protein 7.0 6.0 - 8.5 g/dL   Albumin 4.7 3.5 - 4.7 g/dL   Globulin, Total 2.3 1.5 - 4.5 g/dL   Albumin/Globulin Ratio 2.0 1.1 - 2.5   Bilirubin Total 0.3 0.0 - 1.2 mg/dL   Alkaline Phosphatase 60 39 - 117 IU/L   AST 18 0 - 40 IU/L   ALT 21 0 - 32 IU/L  CBC with Differential     Status: Abnormal   Collection Time: 05/10/15 11:04 AM  Result Value Ref Range   WBC 4.4 3.6 - 11.0 K/uL   RBC 4.58 3.80 - 5.20 MIL/uL   Hemoglobin 12.4 12.0 - 16.0 g/dL   HCT 38.1 35.0 - 47.0 %   MCV 83.2 80.0 - 100.0 fL   MCH 27.1 26.0 - 34.0 pg   MCHC 32.6 32.0 - 36.0 g/dL   RDW 14.9 (H) 11.5 - 14.5 %   Platelets 327 150 - 440 K/uL   Neutrophils Relative % 76 %   Neutro Abs 3.4 1.4 - 6.5 K/uL   Lymphocytes Relative 18 %   Lymphs Abs 0.8 (L) 1.0 - 3.6 K/uL   Monocytes Relative 4 %   Monocytes Absolute 0.2 0.2 - 0.9 K/uL   Eosinophils Relative 1 %   Eosinophils Absolute 0.0 0 - 0.7 K/uL   Basophils Relative 1 %   Basophils Absolute 0.0 0 - 0.1 K/uL  Comprehensive metabolic panel     Status: Abnormal   Collection Time: 05/10/15 11:04 AM  Result Value Ref Range   Sodium 128 (L) 135 - 145 mmol/L   Potassium 3.9 3.5 - 5.1 mmol/L   Chloride 91 (L) 101 - 111 mmol/L   CO2 27 22 - 32 mmol/L   Glucose, Bld 264 (H) 65 - 99 mg/dL   BUN 9 6 - 20 mg/dL   Creatinine, Ser 0.59 0.44 - 1.00 mg/dL   Calcium 9.7 8.9 - 10.3 mg/dL   Total Protein 7.3 6.5 - 8.1 g/dL   Albumin 4.2 3.5 - 5.0 g/dL   AST 25 15 - 41 U/L   ALT 20 14 - 54 U/L   Alkaline  Phosphatase 51 38 - 126 U/L   Total Bilirubin 0.4 0.3 - 1.2 mg/dL   GFR calc non Af Amer >60 >60 mL/min   GFR calc Af Amer >60 >60 mL/min    Comment: (NOTE) The eGFR has been calculated using the CKD EPI equation. This calculation has not been validated in all clinical situations. eGFR's persistently <60 mL/min signify possible Chronic Kidney Disease.    Anion gap 10 5 - 15  Protime-INR     Status: None   Collection Time: 05/10/15 11:04 AM  Result Value Ref Range   Prothrombin Time 12.8  11.4 - 15.0 seconds   INR 0.94   APTT     Status: None   Collection Time: 05/10/15 11:04 AM  Result Value Ref Range   aPTT 28 24 - 36 seconds  Type and screen [place]     Status: None   Collection Time: 05/10/15 11:05 AM  Result Value Ref Range   ABO/RH(D) A POS    Antibody Screen NEG    Sample Expiration 05/13/2015   ABO/Rh     Status: None   Collection Time: 05/10/15 11:06 AM  Result Value Ref Range   ABO/RH(D) A POS   POCT HgB A1C     Status: Abnormal   Collection Time: 07/03/15  8:44 AM  Result Value Ref Range   Hemoglobin A1C 7.5      Assessment & Plan  1. At risk for falling I recommended that Jocelyn Burgess start PT for strength and gait training but she declined. I have advised her that given her long term history of smoking and increased risks for falls she is at risk for major fractures.   2. Major depressive disorder, recurrent, in full remission with anxious distress ([place]) Stable. Refilled valium 5 mg but increased frequency to TID per patient request.   - diazepam (VALIUM) 5 MG tablet; Take 1 tablet (5 mg total) by mouth every 8 (eight) hours as needed for anxiety.  Dispense: 270 tablet; Refill: 1  3. Left hip pain If pain is ongoing for 1 month or has more falls then patient to return to discuss PT, order for wheel chair if not able to graduate back to [employer] use.  - HYDROcodone-acetaminophen (NORCO) 5-325 MG tablet; Take 1 tablet by  mouth every 12 (twelve) hours as needed for moderate pain.  Dispense: 60 tablet; Refill: 0  4. Benign essential tremor Stable.  - diazepam (VALIUM) 5 MG tablet; Take 1 tablet (5 mg total) by mouth every 8 (eight) hours as needed for anxiety.  Dispense: 270 tablet; Refill: 1

## 2015-07-12 ENCOUNTER — Telehealth: Payer: Self-pay | Admitting: Family Medicine

## 2015-07-12 NOTE — Telephone Encounter (Signed)
Patient fell last week and was seen this past Monday. States that the medication is not working and would like a prescription for a muscle relaxer. Please send to [facility]

## 2015-07-13 ENCOUNTER — Other Ambulatory Visit: Payer: Self-pay | Admitting: Family Medicine

## 2015-07-13 MED ORDER — CYCLOBENZAPRINE HCL 5 MG PO TABS: 5.0000 mg | ORAL_TABLET | Freq: Three times a day (TID) | ORAL | Status: DC | PRN

## 2015-07-13 NOTE — Telephone Encounter (Signed)
Pt informed

## 2015-07-13 NOTE — Telephone Encounter (Signed)
Flexeril sent to pharmacy.

## 2015-07-16 ENCOUNTER — Telehealth: Payer: Self-pay

## 2015-07-16 NOTE — Telephone Encounter (Signed)
Patients husband called she is still having pain from fall wants to see if we can order an x-ray of chest/ ribs

## 2015-07-17 ENCOUNTER — Emergency Department: Payer: Medicare Other

## 2015-07-17 ENCOUNTER — Encounter: Payer: Self-pay | Admitting: *Deleted

## 2015-07-17 ENCOUNTER — Emergency Department
Admission: EM | Admit: 2015-07-17 | Discharge: 2015-07-17 | Disposition: A | Discharge status: Home / Self Care | Payer: Medicare Other | Attending: Emergency Medicine | Admitting: Emergency Medicine

## 2015-07-17 DIAGNOSIS — Z9981 Dependence on supplemental oxygen: Secondary | ICD-10-CM | POA: Insufficient documentation

## 2015-07-17 DIAGNOSIS — Z7982 Long term (current) use of aspirin: Secondary | ICD-10-CM | POA: Diagnosis not present

## 2015-07-17 DIAGNOSIS — R0781 Pleurodynia: Secondary | ICD-10-CM | POA: Diagnosis not present

## 2015-07-17 DIAGNOSIS — Z79899 Other long term (current) drug therapy: Secondary | ICD-10-CM | POA: Insufficient documentation

## 2015-07-17 DIAGNOSIS — I10 Essential (primary) hypertension: Secondary | ICD-10-CM | POA: Insufficient documentation

## 2015-07-17 DIAGNOSIS — E876 Hypokalemia: Secondary | ICD-10-CM | POA: Diagnosis not present

## 2015-07-17 DIAGNOSIS — R05 Cough: Secondary | ICD-10-CM | POA: Insufficient documentation

## 2015-07-17 DIAGNOSIS — K5901 Slow transit constipation: Secondary | ICD-10-CM | POA: Insufficient documentation

## 2015-07-17 DIAGNOSIS — R109 Unspecified abdominal pain: Secondary | ICD-10-CM | POA: Diagnosis present

## 2015-07-17 DIAGNOSIS — Z7951 Long term (current) use of inhaled steroids: Secondary | ICD-10-CM | POA: Insufficient documentation

## 2015-07-17 DIAGNOSIS — Z791 Long term (current) use of non-steroidal anti-inflammatories (NSAID): Secondary | ICD-10-CM | POA: Diagnosis not present

## 2015-07-17 DIAGNOSIS — F17210 Nicotine dependence, cigarettes, uncomplicated: Secondary | ICD-10-CM | POA: Insufficient documentation

## 2015-07-17 DIAGNOSIS — E119 Type 2 diabetes mellitus without complications: Secondary | ICD-10-CM | POA: Diagnosis not present

## 2015-07-17 LAB — COMPREHENSIVE METABOLIC PANEL
ALK PHOS: 101 U/L (ref 38–126)
ALT: 15 U/L (ref 14–54)
ANION GAP: 15 (ref 5–15)
AST: 26 U/L (ref 15–41)
Albumin: 4.2 g/dL (ref 3.5–5.0)
BILIRUBIN TOTAL: 0.4 mg/dL (ref 0.3–1.2)
BUN: 13 mg/dL (ref 6–20)
CALCIUM: 10.1 mg/dL (ref 8.9–10.3)
CO2: 32 mmol/L (ref 22–32)
Chloride: 81 mmol/L — ABNORMAL LOW (ref 101–111)
Creatinine, Ser: 0.7 mg/dL (ref 0.44–1.00)
GFR calc non Af Amer: >60 mL/min (ref >60)
Glucose, Bld: 271 mg/dL — ABNORMAL HIGH (ref 65–99)
Potassium: 2.9 mmol/L — CL (ref 3.5–5.1)
SODIUM: 128 mmol/L — AB (ref 135–145)
TOTAL PROTEIN: 8 g/dL (ref 6.5–8.1)

## 2015-07-17 LAB — CBC WITH DIFFERENTIAL/PLATELET
BASOS PCT: 1 %
Basophils Absolute: 0.1 10*3/uL (ref 0–0.1)
EOS ABS: 0 10*3/uL (ref 0–0.7)
Eosinophils Relative: 0 %
HCT: 34.6 % — ABNORMAL LOW (ref 35.0–47.0)
HEMOGLOBIN: 11.5 g/dL — AB (ref 12.0–16.0)
Lymphocytes Relative: 15 %
Lymphs Abs: 1.4 10*3/uL (ref 1.0–3.6)
MCH: 26.1 pg (ref 26.0–34.0)
MCHC: 33.3 g/dL (ref 32.0–36.0)
MCV: 78.2 fL — ABNORMAL LOW (ref 80.0–100.0)
Monocytes Absolute: 0.7 10*3/uL (ref 0.2–0.9)
Monocytes Relative: 8 %
NEUTROS PCT: 76 %
Neutro Abs: 6.8 10*3/uL — ABNORMAL HIGH (ref 1.4–6.5)
PLATELETS: 498 10*3/uL — AB (ref 150–440)
RBC: 4.43 MIL/uL (ref 3.80–5.20)
RDW: 15.5 % — ABNORMAL HIGH (ref 11.5–14.5)
WBC: 9 10*3/uL (ref 3.6–11.0)

## 2015-07-17 LAB — URINALYSIS COMPLETE WITH MICROSCOPIC (ARMC ONLY)
BILIRUBIN URINE: NEGATIVE
Bacteria, UA: NONE SEEN
GLUCOSE, UA: 50 mg/dL — AB
Hgb urine dipstick: NEGATIVE
Leukocytes, UA: NEGATIVE
Nitrite: NEGATIVE
Protein, ur: NEGATIVE mg/dL
Specific Gravity, Urine: 1.026 (ref 1.005–1.030)
pH: 6 (ref 5.0–8.0)

## 2015-07-17 LAB — TROPONIN I: Troponin I: <0.03 ng/mL (ref <0.031)

## 2015-07-17 LAB — LIPASE, BLOOD: LIPASE: 24 U/L (ref 11–51)

## 2015-07-17 MED ORDER — IPRATROPIUM-ALBUTEROL 0.5-2.5 (3) MG/3ML IN SOLN
3.0000 mL | Freq: Once | RESPIRATORY_TRACT | Status: AC
  Administered 2015-07-17: 3 mL via RESPIRATORY_TRACT
  Filled 2015-07-17: qty 3

## 2015-07-17 MED ORDER — POTASSIUM CHLORIDE CRYS ER 20 MEQ PO TBCR
40.0000 meq | EXTENDED_RELEASE_TABLET | Freq: Once | ORAL | Status: AC
  Administered 2015-07-17: 40 meq via ORAL
  Filled 2015-07-17: qty 2

## 2015-07-17 MED ORDER — IOHEXOL 300 MG/ML  SOLN
80.0000 mL | Freq: Once | INTRAMUSCULAR | Status: AC | PRN
  Administered 2015-07-17: 100 mL via INTRAVENOUS

## 2015-07-17 MED ORDER — IOHEXOL 240 MG/ML SOLN
25.0000 mL | Freq: Once | INTRAMUSCULAR | Status: AC | PRN
  Administered 2015-07-17: 25 mL via ORAL

## 2015-07-17 NOTE — ED Notes (Signed)
Pt to triage via wheelchair with fecal impaction.  Pt states no BM in 5days.  Pt fell 2 weeks ago and is taking hydrocodone.  Pt has abd pain. Pt denies chest pain.  Pt alert.

## 2015-07-17 NOTE — ED Provider Notes (Signed)
[facility] Emergency Department Provider Note  ____________________________________________  Time seen: Approximately 16:24 PM  I have reviewed the triage vital signs and the nursing notes.   HISTORY  Chief Complaint Fecal Impaction    HPI Jocelyn Burgess is a 82 y.o. female presents for evaluation of abdominal pain and constipation.  Little unclear as to why triage notes "fecal impaction" the patient reports that she has not had any pain around the rectum. She does report she feels bloated, constipated, and is not in pain. She is able to eat and drink. She reports she is passing gas but has not had a good bowel movement about 5 days.  She has recently been on hydrocodone and believes that this has caused her bowels to stop up. No chest pain or trouble breathing. Uses 2 L of oxygen at home.  Does have occasional crampy discomfort like "gas". None presently.  Past Medical History  Diagnosis Date  . Allergy   . Cancer ([place])   . Diabetes mellitus without complication ([place])   . Arthritis   . Asthma   . Cataract   . COPD (chronic obstructive pulmonary disease) ([place])   . Depression     sometimes  . Hyperlipidemia   . Hypertension   . Oxygen deficiency     Patient Active Problem List   Diagnosis Date Noted  . Left hip pain 07/09/2015  . Benign essential tremor 07/09/2015  . Venous stasis dermatitis of both lower extremities 07/03/2015  . Bilateral edema of lower extremity 05/30/2015  . Bleeding nose 05/08/2015  . Morning headache 05/01/2015  . Intermittent confusion 04/17/2015  . Cough 04/17/2015  . Constipation, slow transit 04/09/2015  . Allergic rhinitis with postnasal drip 04/09/2015  . Hypertension goal BP (blood pressure) < 140/90 01/22/2015  . Major depressive disorder, recurrent, in full remission with anxious distress ([place]) 11/23/2014  . Bronchitis with chronic airway obstruction ([place]) 11/23/2014  . Heavy cigarette smoker 11/23/2014   . COPD, severe ([place]) 11/23/2014  . DD (diverticular disease) 11/23/2014  . Personal history of malignant neoplasm of breast 11/23/2014  . History of digestive disease 11/23/2014  . Personal history of healed traumatic fracture 11/23/2014  . HLD (hyperlipidemia) 11/23/2014  . Peripheral neuropathic pain ([place]) 11/23/2014  . At risk for falling 11/23/2014  . Diabetes mellitus type 2, controlled, with complications ([place]) [id number]    Past Surgical History  Procedure Laterality Date  . Breast surgery Left     Current Outpatient Rx  Name  Route  Sig  Dispense  Refill  . albuterol (PROVENTIL HFA;VENTOLIN HFA) 108 (90 Base) MCG/ACT inhaler   Inhalation   Inhale 2 puffs into the lungs every 4 (four) hours as needed for wheezing or shortness of breath.          Marland Kitchen aspirin EC 81 MG tablet   Oral   Take 81 mg by mouth at bedtime.         . citalopram (CELEXA) 20 MG tablet   Oral   Take 20 mg by mouth at bedtime.          . cloNIDine (CATAPRES) 0.1 MG tablet   Oral   Take 0.1 mg by mouth 2 (two) times daily.         . cyclobenzaprine (FLEXERIL) 5 MG tablet   Oral   Take 1 tablet (5 mg total) by mouth 3 (three) times daily as needed for muscle spasms.   30 tablet   1   . diazepam (  VALIUM) 5 MG tablet   Oral   Take 1 tablet (5 mg total) by mouth every 8 (eight) hours as needed for anxiety.   270 tablet   1     90 day supply with 1 refill   . furosemide (LASIX) 20 MG tablet   Oral   Take 20 mg by mouth daily.         Marland Kitchen gabapentin (NEURONTIN) 300 MG capsule   Oral   Take 300 mg by mouth 3 (three) times daily.         Marland Kitchen gemfibrozil (LOPID) 600 MG tablet   Oral   Take 600 mg by mouth 2 (two) times daily before a meal.         . glimepiride (AMARYL) 4 MG tablet   Oral   Take 2 mg by mouth 2 (two) times daily.          . hydrochlorothiazide (HYDRODIURIL) 25 MG tablet   Oral   Take 25 mg by mouth daily.         Marland Kitchen HYDROcodone-acetaminophen (NORCO)  5-325 MG tablet   Oral   Take 1 tablet by mouth every 12 (twelve) hours as needed for moderate pain.   60 tablet   0     Printed 07/09/15   . loratadine (CLARITIN) 10 MG tablet   Oral   Take 10 mg by mouth daily.          Marland Kitchen lovastatin (MEVACOR) 20 MG tablet   Oral   Take 20 mg by mouth at bedtime.         . magnesium hydroxide (MILK OF MAGNESIA) 400 MG/5ML suspension   Oral   Take 30 mLs by mouth daily as needed for mild constipation.          . meclizine (ANTIVERT) 25 MG tablet   Oral   Take 25 mg by mouth every 8 (eight) hours as needed for dizziness.         . meloxicam (MOBIC) 15 MG tablet   Oral   Take 1 tablet (15 mg total) by mouth daily.   90 tablet   2   . metFORMIN (GLUCOPHAGE) 1000 MG tablet   Oral   Take 1 tablet (1,000 mg total) by mouth 2 (two) times daily with a meal.   180 tablet   1     Disregard previous doses and RXs for this medicati ...   . mometasone (ELOCON) 0.1 % lotion   Topical   Apply 1 application topically as needed (for dry,itchy skin). Pt applies to both ears as needed.         . montelukast (SINGULAIR) 10 MG tablet   Oral   Take 10 mg by mouth at bedtime.         . theophylline (UNIPHYL) 400 MG 24 hr tablet   Oral   Take 400 mg by mouth daily.          Marland Kitchen tiotropium (SPIRIVA) 18 MCG inhalation capsule   Inhalation   Place 18 mcg into inhaler and inhale daily.         Marland Kitchen nystatin (MYCOSTATIN) 100000 UNIT/ML suspension   Oral   Take 5 mLs (500,000 Units total) by mouth 4 (four) times daily. Swish retain in mouth long as possible then swallow Patient not taking: Reported on 07/17/2015   120 mL   0     Allergies Ciprofloxacin; Inhaler decongestant; and Molds & smuts  Family History  Problem Relation Age of  Onset  . Arthritis Mother   . Arthritis Father   . Cancer Sister     brain  . Heart disease Sister   . Stroke Sister   . Diabetes Brother     Social History Social History  Substance Use Topics   . Smoking status: Current Every Day Smoker -- 1.00 packs/day for 45 years    Types: Cigarettes  . Smokeless tobacco: Current User  . Alcohol Use: No    Review of Systems Constitutional: No fever/chills Eyes: No visual changes. ENT: No sore throat. Cardiovascular: Denies chest pain. Respiratory: Denies shortness of breath. Chronic cough without change. She does feels sore around the ribs on the right rib cage which is been present for the last several days since falling. Gastrointestinal: No nausea, no vomiting.  No diarrhea.  Genitourinary: Negative for dysuria. Musculoskeletal: Negative for back pain. Ranges the right hip well. No pain with axial loading. Good strength. Skin: Negative for rash. Neurological: Negative for headaches, focal weakness or numbness.  10-point ROS otherwise negative.  ____________________________________________   PHYSICAL EXAM:  VITAL SIGNS: ED Triage Vitals  Enc Vitals Group     BP 07/17/15 1542 144/77 mmHg     Pulse Rate 07/17/15 1542 13     Resp 07/17/15 1542 22     Temp 07/17/15 1542 98.6 F (37 C)     Temp Source 07/17/15 1542 Oral     SpO2 07/17/15 1542 95 %     Weight 07/17/15 1542 182 lb (82.555 kg)     Height 07/17/15 1542 5\' 5"  (1.651 m)     Head Cir --      Peak Flow --      Pain Score 07/17/15 1543 10     Pain Loc --      Pain Edu? --      Excl. in [place]? --    Constitutional: Alert and oriented. Well appearing and in no acute distress. Amicable with husband at bedside. Eyes: Conjunctivae are normal. PERRL. EOMI. Head: Atraumatic. Nose: No congestion/rhinnorhea. Mouth/Throat: Mucous membranes are moist.  Oropharynx non-erythematous. Neck: No stridor.   Cardiovascular: Normal rate, regular rhythm. Grossly normal heart sounds.  Good peripheral circulation. Respiratory: Normal respiratory effort.  No retractions. Lungs CTAB except for faint dry crackles in the slight increased expiratory phase without wheezing. Patient reports her  breathing is normal. She is on 2 L normally.. Gastrointestinal: Soft and nontender. Minimal distention. Decreased but present bowel sounds. Rectal exam performed with nurse Terrence Dupont. Rectal vault without stool ball or impaction. Heme-negative. Musculoskeletal: No lower extremity tenderness nor edema.  No joint effusions. Neurologic:  Normal speech and language. No gross focal neurologic deficits are appreciated. Skin:  Skin is warm, dry and intact. No rash noted. Psychiatric: Mood and affect are normal. Speech and behavior are normal.  ____________________________________________   LABS (all labs ordered are listed, but only abnormal results are displayed)  Labs Reviewed  COMPREHENSIVE METABOLIC PANEL - Abnormal; Notable for the following:    Sodium 128 (*)    Potassium 2.9 (*)    Chloride 81 (*)    Glucose, Bld 271 (*)    All other components within normal limits  CBC WITH DIFFERENTIAL/PLATELET - Abnormal; Notable for the following:    Hemoglobin 11.5 (*)    HCT 34.6 (*)    MCV 78.2 (*)    RDW 15.5 (*)    Platelets 498 (*)    Neutro Abs 6.8 (*)    All other components within normal  limits  URINALYSIS COMPLETEWITH MICROSCOPIC ([facility] ONLY) - Abnormal; Notable for the following:    Color, Urine YELLOW (*)    APPearance CLEAR (*)    Glucose, UA 50 (*)    Ketones, ur TRACE (*)    Squamous Epithelial / LPF 0-5 (*)    All other components within normal limits  LIPASE, BLOOD  TROPONIN I   ____________________________________________  EKG  Reviewed and interpreted as sinus tachycardia with ventricular rate 140 QRS 70 QTc 4:30 No ischemic abdomen is Reviewed and interpreted by me ____________________________________________  RADIOLOGY   CT Abdomen Pelvis W Contrast (Final result) Result time: 07/17/15 19:32:32   Final result by Rad Results In Interface (07/17/15 19:32:32)   Narrative:   CLINICAL DATA: Generalized abdominal pain.  EXAM: CT ABDOMEN AND PELVIS WITH  CONTRAST  TECHNIQUE: Multidetector CT imaging of the abdomen and pelvis was performed using the standard protocol following bolus administration of intravenous contrast.  CONTRAST: 135mL OMNIPAQUE IOHEXOL 300 MG/ML SOLN  COMPARISON: CT scan of May 01, 2007.  FINDINGS: Multilevel degenerative disc disease is noted in the lumbar spine. Stable right pulmonary nodule is noted.  No gallstones are noted. The liver, spleen and pancreas appear normal. Adrenal glands and kidneys are unremarkable. No hydronephrosis or renal obstruction is noted. No renal or ureteral calculi are noted. Atherosclerosis of abdominal aorta is noted without aneurysm formation. There is no evidence of bowel obstruction. No abnormal fluid collection is noted. Uterus and ovaries are unremarkable. Mild urinary bladder distention is noted. No significant adenopathy is noted.  IMPRESSION: Atherosclerosis of abdominal aorta without aneurysm formation.  Stable bilateral pulmonary nodules compared to prior exam.  Mild urinary bladder distention is noted.  No other abnormality seen in the abdomen or pelvis.   Electronically Signed By: Marijo Conception, M.D. On: 07/17/2015 19:32          DG Chest 2 View (Final result) Result time: 07/17/15 17:04:07   Final result by Rad Results In Interface (07/17/15 17:04:07)   Narrative:   CLINICAL DATA: No bowel movement for 5 days. Fall 2 weeks ago.  EXAM: CHEST 2 VIEW  COMPARISON: 04/17/2015  FINDINGS: Normal heart size. No pleural effusion or edema. Diffuse reticular interstitial opacities are identified throughout both lungs. This is similar to previous exam reflecting chronic lung disease. No superimposed airspace consolidation. Aortic atherosclerosis noted. There is a progressive compression deformity a within the upper thoracic spine.  IMPRESSION: 1. No acute findings. 2. Chronic lung disease. 3. Progression of upper thoracic spine  compression deformity.   Electronically Signed By: Kerby Moors M.D. On: 07/17/2015 17:04          DG Abd 2 Views (Final result) Result time: 07/17/15 17:03:47   Final result by Rad Results In Interface (07/17/15 17:03:47)   Narrative:   CLINICAL DATA: Pt to triage via wheelchair with fecal impaction. Pt states no BM in 5days. Pt fell 2 weeks ago and is taking hydrocodone. Pt has abd pain. Pt denies chest pain. Pt alert. wheezing with clear sputum, more SOB than usual X 2 days. Smoker. COPD, HTN. Hx of cancer.  EXAM: ABDOMEN - 2 VIEW  COMPARISON: CT, 05/01/2007  FINDINGS: Mild stool burden in the rectum and colon. No evidence of bowel obstruction. No free air.  There are scattered aortoiliac vascular calcifications. No evidence of renal or ureteral stones. Soft tissues are otherwise unremarkable.  Bony structures are demineralized. There are degenerative changes of the lumbar spine.  IMPRESSION: 1. No acute findings. No evidence  of bowel obstruction or free air. 2. Mild stool burden noted in the rectum and sigmoid colon.   Electronically Signed By: Lajean Manes M.D. On: 07/17/2015 17:03    ____________________________________________   PROCEDURES  Procedure(s) performed: None  Critical Care performed: No  ____________________________________________   INITIAL IMPRESSION / ASSESSMENT AND PLAN / ED COURSE  Pertinent labs & imaging results that were available during my care of the patient were reviewed by me and considered in my medical decision making (see chart for details).  ----------------------------------------- 8:28 PM on 07/17/2015 -----------------------------------------  Patient reports she feels okay. Offered enema and laxatives initiated in the emergency room, patient does not wish this and wishes to instead go home and use medication at home to attempt bowel movement. I think this is reasonable. No evidence of acute  bowel obstruction or fecal impaction by exam or history. She is awake and alert in no distress. She is at her baseline oxygen level. She has some very minimal tachycardia when moving about but her heart rate is sitting in the 90s when sitting comfortably in the room. She does not have any chest pain or acute cardiac pulmonary symptoms. No evidence of acute infection.  I'll discharge her to home. Carefully reviewed plan of treatment including use of MiraLAX and a dose of magnesium citrate at home. Patient husband both agreeable.  Return precautions and treatment recommendations and follow-up discussed with the patient who is agreeable with the plan.  ____________________________________________   FINAL CLINICAL IMPRESSION(S) / ED DIAGNOSES  Final diagnoses:  Slow transit constipation  Hypokalemia      Delman Kitten, MD 07/17/15 2037

## 2015-07-17 NOTE — Telephone Encounter (Signed)
Patient proceeded to ER, see imaging done there.

## 2015-07-17 NOTE — Discharge Instructions (Signed)
Please take a bottle of "magnesium citrate" from the pharmacy once you get home. I also recommend using over-the-counter MiraLAX to assist with stooling. Follow instructions on packaging.  Constipation, Adult Constipation is when a person has fewer than three bowel movements a week, has difficulty having a bowel movement, or has stools that are dry, hard, or larger than normal. As people grow older, constipation is more common. A low-fiber diet, not taking in enough fluids, and taking certain medicines may make constipation worse.  CAUSES   Certain medicines, such as antidepressants, pain medicine, iron supplements, antacids, and water pills.   Certain diseases, such as diabetes, irritable bowel syndrome (IBS), thyroid disease, or depression.   Not drinking enough water.   Not eating enough fiber-rich foods.   Stress or travel.   Lack of physical activity or exercise.   Ignoring the urge to have a bowel movement.   Using laxatives too much.  SIGNS AND SYMPTOMS   Having fewer than three bowel movements a week.   Straining to have a bowel movement.   Having stools that are hard, dry, or larger than normal.   Feeling full or bloated.   Pain in the lower abdomen.   Not feeling relief after having a bowel movement.  DIAGNOSIS  Your health care provider will take a medical history and perform a physical exam. Further testing may be done for severe constipation. Some tests may include:  A barium enema X-ray to examine your rectum, colon, and, sometimes, your small intestine.   A sigmoidoscopy to examine your lower colon.   A colonoscopy to examine your entire colon. TREATMENT  Treatment will depend on the severity of your constipation and what is causing it. Some dietary treatments include drinking more fluids and eating more fiber-rich foods. Lifestyle treatments may include regular exercise. If these diet and lifestyle recommendations do not help, your health  care provider may recommend taking over-the-counter laxative medicines to help you have bowel movements. Prescription medicines may be prescribed if over-the-counter medicines do not work.  HOME CARE INSTRUCTIONS   Eat foods that have a lot of fiber, such as fruits, vegetables, whole grains, and beans.  Limit foods high in fat and processed sugars, such as french fries, hamburgers, cookies, candies, and soda.   A fiber supplement may be added to your diet if you cannot get enough fiber from foods.   Drink enough fluids to keep your urine clear or pale yellow.   Exercise regularly or as directed by your health care provider.   Go to the restroom when you have the urge to go. Do not hold it.   Only take over-the-counter or prescription medicines as directed by your health care provider. Do not take other medicines for constipation without talking to your health care provider first.  [place] IF:   You have bright red blood in your stool.   Your constipation lasts for more than 4 days or gets worse.   You have abdominal or rectal pain.   You have thin, pencil-like stools.   You have unexplained weight loss. MAKE SURE YOU:   Understand these instructions.  Will watch your condition.  Will get help right away if you are not doing well or get worse.   This information is not intended to replace advice given to you by your health care provider. Make sure you discuss any questions you have with your health care provider.   Document Released: 02/29/2004 Document Revised: 06/23/2014 Document Reviewed:  03/14/2013 Elsevier Interactive Patient Education [employer].

## 2015-07-19 ENCOUNTER — Other Ambulatory Visit: Payer: Self-pay | Admitting: Family Medicine

## 2015-07-19 ENCOUNTER — Encounter: Payer: Self-pay | Admitting: *Deleted

## 2015-07-19 ENCOUNTER — Emergency Department: Payer: Medicare Other

## 2015-07-19 ENCOUNTER — Emergency Department
Admission: EM | Admit: 2015-07-19 | Discharge: 2015-07-19 | Disposition: A | Discharge status: Home / Self Care | Payer: Medicare Other | Source: Home / Self Care | Attending: Emergency Medicine | Admitting: Emergency Medicine

## 2015-07-19 DIAGNOSIS — F17210 Nicotine dependence, cigarettes, uncomplicated: Secondary | ICD-10-CM

## 2015-07-19 DIAGNOSIS — Z791 Long term (current) use of non-steroidal anti-inflammatories (NSAID): Secondary | ICD-10-CM

## 2015-07-19 DIAGNOSIS — Z7951 Long term (current) use of inhaled steroids: Secondary | ICD-10-CM

## 2015-07-19 DIAGNOSIS — Z7982 Long term (current) use of aspirin: Secondary | ICD-10-CM | POA: Insufficient documentation

## 2015-07-19 DIAGNOSIS — J441 Chronic obstructive pulmonary disease with (acute) exacerbation: Secondary | ICD-10-CM

## 2015-07-19 DIAGNOSIS — K5900 Constipation, unspecified: Secondary | ICD-10-CM | POA: Insufficient documentation

## 2015-07-19 DIAGNOSIS — R631 Polydipsia: Secondary | ICD-10-CM | POA: Diagnosis not present

## 2015-07-19 DIAGNOSIS — Z9981 Dependence on supplemental oxygen: Secondary | ICD-10-CM

## 2015-07-19 DIAGNOSIS — E871 Hypo-osmolality and hyponatremia: Secondary | ICD-10-CM | POA: Diagnosis not present

## 2015-07-19 DIAGNOSIS — Z7984 Long term (current) use of oral hypoglycemic drugs: Secondary | ICD-10-CM

## 2015-07-19 DIAGNOSIS — I10 Essential (primary) hypertension: Secondary | ICD-10-CM | POA: Insufficient documentation

## 2015-07-19 DIAGNOSIS — E119 Type 2 diabetes mellitus without complications: Secondary | ICD-10-CM

## 2015-07-19 LAB — CBC WITH DIFFERENTIAL/PLATELET
Basophils Absolute: 0.1 10*3/uL (ref 0–0.1)
Basophils Relative: 1 %
EOS ABS: 0.1 10*3/uL (ref 0–0.7)
Eosinophils Relative: 1 %
HCT: 34.2 % — ABNORMAL LOW (ref 35.0–47.0)
HEMOGLOBIN: 11.3 g/dL — AB (ref 12.0–16.0)
LYMPHS ABS: 2.1 10*3/uL (ref 1.0–3.6)
MCH: 25.9 pg — AB (ref 26.0–34.0)
MCHC: 33.1 g/dL (ref 32.0–36.0)
MCV: 78.1 fL — AB (ref 80.0–100.0)
Monocytes Absolute: 0.8 10*3/uL (ref 0.2–0.9)
Monocytes Relative: 7 %
Neutro Abs: 7.8 10*3/uL — ABNORMAL HIGH (ref 1.4–6.5)
Platelets: 495 10*3/uL — ABNORMAL HIGH (ref 150–440)
RBC: 4.38 MIL/uL (ref 3.80–5.20)
RDW: 15.3 % — ABNORMAL HIGH (ref 11.5–14.5)
WBC: 10.9 10*3/uL (ref 3.6–11.0)

## 2015-07-19 LAB — BASIC METABOLIC PANEL
Anion gap: 13 (ref 5–15)
BUN: 9 mg/dL (ref 6–20)
CHLORIDE: 79 mmol/L — AB (ref 101–111)
CO2: 31 mmol/L (ref 22–32)
Calcium: 9.9 mg/dL (ref 8.9–10.3)
Creatinine, Ser: 0.5 mg/dL (ref 0.44–1.00)
GFR calc Af Amer: >60 mL/min (ref >60)
GFR calc non Af Amer: >60 mL/min (ref >60)
Glucose, Bld: 160 mg/dL — ABNORMAL HIGH (ref 65–99)
POTASSIUM: 3.1 mmol/L — AB (ref 3.5–5.1)
SODIUM: 123 mmol/L — AB (ref 135–145)

## 2015-07-19 MED ORDER — SORBITOL 70 % SOLN
960.0000 mL | TOPICAL_OIL | Freq: Once | ORAL | Status: AC
  Administered 2015-07-19: 960 mL via RECTAL
  Filled 2015-07-19: qty 240

## 2015-07-19 MED ORDER — POTASSIUM CHLORIDE CRYS ER 20 MEQ PO TBCR
20.0000 meq | EXTENDED_RELEASE_TABLET | Freq: Once | ORAL | Status: AC
  Administered 2015-07-19: 20 meq via ORAL

## 2015-07-19 MED ORDER — POTASSIUM CHLORIDE ER 10 MEQ PO TBCR: 20.0000 meq | EXTENDED_RELEASE_TABLET | Freq: Two times a day (BID) | ORAL | Status: DC

## 2015-07-19 NOTE — ED Notes (Signed)
MD at bedside. 

## 2015-07-19 NOTE — ED Notes (Signed)
Pt placed on 3L [place], home 02

## 2015-07-19 NOTE — ED Provider Notes (Signed)
Time Seen: Approximately 11 AM I have reviewed the triage notes  Chief Complaint: Fecal Impaction   History of Present Illness: Jocelyn Burgess is a 82 y.o. female who presents with discomfort in the abdominal area. Patient was recently evaluated here was diagnosed with constipation. She was offered an enema here in emergency department but declined at that time. Patient states she still hasn't had any bowel movement of significance since her discharge on the 31st. Patient's had a decreased appetite and decreased food and fluid intake due to the discomfort. She denies any preexistent melena or hematochezia. Patient has been on chronic narcotics at times due to arthritis, etc. She denies any focal abdominal pain and states that she has been passing a large amount of gas. She denies any new weakness in either lower extremities or any significant back pain etc. Patient's on chronic oxygen therapy secondary to COPD.   Past Medical History  Diagnosis Date  . Allergy   . Cancer ([place])   . Diabetes mellitus without complication ([place])   . Arthritis   . Asthma   . Cataract   . COPD (chronic obstructive pulmonary disease) ([place])   . Depression     sometimes  . Hyperlipidemia   . Hypertension   . Oxygen deficiency     Patient Active Problem List   Diagnosis Date Noted  . Left hip pain 07/09/2015  . Benign essential tremor 07/09/2015  . Venous stasis dermatitis of both lower extremities 07/03/2015  . Bilateral edema of lower extremity 05/30/2015  . Bleeding nose 05/08/2015  . Morning headache 05/01/2015  . Intermittent confusion 04/17/2015  . Cough 04/17/2015  . Constipation, slow transit 04/09/2015  . Allergic rhinitis with postnasal drip 04/09/2015  . Hypertension goal BP (blood pressure) < 140/90 01/22/2015  . Major depressive disorder, recurrent, in full remission with anxious distress ([place]) 11/23/2014  . Bronchitis with chronic airway obstruction ([place]) 11/23/2014  . Heavy cigarette  smoker 11/23/2014  . COPD, severe ([place]) 11/23/2014  . DD (diverticular disease) 11/23/2014  . Personal history of malignant neoplasm of breast 11/23/2014  . History of digestive disease 11/23/2014  . Personal history of healed traumatic fracture 11/23/2014  . HLD (hyperlipidemia) 11/23/2014  . Peripheral neuropathic pain ([place]) 11/23/2014  . At risk for falling 11/23/2014  . Diabetes mellitus type 2, controlled, with complications ([place]) [id number]    Past Surgical History  Procedure Laterality Date  . Breast surgery Left     Past Surgical History  Procedure Laterality Date  . Breast surgery Left     Current Outpatient Rx  Name  Route  Sig  Dispense  Refill  . albuterol (PROVENTIL HFA;VENTOLIN HFA) 108 (90 Base) MCG/ACT inhaler   Inhalation   Inhale 2 puffs into the lungs every 4 (four) hours as needed for wheezing or shortness of breath.          Marland Kitchen aspirin EC 81 MG tablet   Oral   Take 81 mg by mouth at bedtime.         . citalopram (CELEXA) 20 MG tablet   Oral   Take 20 mg by mouth at bedtime.          . cloNIDine (CATAPRES) 0.1 MG tablet   Oral   Take 0.1 mg by mouth 2 (two) times daily.         . cyclobenzaprine (FLEXERIL) 5 MG tablet   Oral   Take 1 tablet (5 mg total) by mouth 3 (three) times daily as  needed for muscle spasms.   30 tablet   1   . diazepam (VALIUM) 5 MG tablet   Oral   Take 1 tablet (5 mg total) by mouth every 8 (eight) hours as needed for anxiety.   270 tablet   1     90 day supply with 1 refill   . furosemide (LASIX) 20 MG tablet   Oral   Take 20 mg by mouth daily.         Marland Kitchen gabapentin (NEURONTIN) 300 MG capsule   Oral   Take 300 mg by mouth 3 (three) times daily.         Marland Kitchen gemfibrozil (LOPID) 600 MG tablet   Oral   Take 600 mg by mouth 2 (two) times daily before a meal.         . glimepiride (AMARYL) 4 MG tablet   Oral   Take 2 mg by mouth 2 (two) times daily.          . hydrochlorothiazide (HYDRODIURIL) 25  MG tablet   Oral   Take 25 mg by mouth daily.         Marland Kitchen HYDROcodone-acetaminophen (NORCO) 5-325 MG tablet   Oral   Take 1 tablet by mouth every 12 (twelve) hours as needed for moderate pain.   60 tablet   0     Printed 07/09/15   . loratadine (CLARITIN) 10 MG tablet   Oral   Take 10 mg by mouth daily.          Marland Kitchen lovastatin (MEVACOR) 20 MG tablet   Oral   Take 20 mg by mouth at bedtime.         . magnesium hydroxide (MILK OF MAGNESIA) 400 MG/5ML suspension   Oral   Take 30 mLs by mouth daily as needed for mild constipation.          . meclizine (ANTIVERT) 25 MG tablet   Oral   Take 25 mg by mouth every 8 (eight) hours as needed for dizziness.         . meloxicam (MOBIC) 15 MG tablet   Oral   Take 1 tablet (15 mg total) by mouth daily.   90 tablet   2   . metFORMIN (GLUCOPHAGE) 1000 MG tablet   Oral   Take 1 tablet (1,000 mg total) by mouth 2 (two) times daily with a meal.   180 tablet   1     Disregard previous doses and RXs for this medicati ...   . mometasone (ELOCON) 0.1 % lotion   Topical   Apply 1 application topically as needed (for dry,itchy skin). Pt applies to both ears as needed.         . montelukast (SINGULAIR) 10 MG tablet   Oral   Take 10 mg by mouth at bedtime.         . theophylline (UNIPHYL) 400 MG 24 hr tablet   Oral   Take 400 mg by mouth daily.          Marland Kitchen tiotropium (SPIRIVA) 18 MCG inhalation capsule   Inhalation   Place 18 mcg into inhaler and inhale daily.         Marland Kitchen nystatin (MYCOSTATIN) 100000 UNIT/ML suspension   Oral   Take 5 mLs (500,000 Units total) by mouth 4 (four) times daily. Swish retain in mouth long as possible then swallow Patient not taking: Reported on 07/17/2015   120 mL   0   . potassium chloride (  K-DUR) 10 MEQ tablet   Oral   Take 2 tablets (20 mEq total) by mouth 2 (two) times daily.   20 tablet   0     Allergies:  Ciprofloxacin; Inhaler decongestant; and Molds & smuts  Family  History: Family History  Problem Relation Age of Onset  . Arthritis Mother   . Arthritis Father   . Cancer Sister     brain  . Heart disease Sister   . Stroke Sister   . Diabetes Brother     Social History: Social History  Substance Use Topics  . Smoking status: Current Every Day Smoker -- 1.00 packs/day for 45 years    Types: Cigarettes  . Smokeless tobacco: Current User  . Alcohol Use: No     Review of Systems:   10 point review of systems was performed and was otherwise negative:  Constitutional: No fever Eyes: No visual disturbances ENT: No sore throat, ear pain Cardiac: No chest pain Respiratory: No shortness of breath, wheezing, or stridor Abdomen: No abdominal pain, no vomiting, No diarrhea Endocrine: No weight loss, No night sweats Extremities: No peripheral edema, cyanosis Skin: No rashes, easy bruising Neurologic: No focal weakness, trouble with speech or swollowing Urologic: No dysuria, Hematuria, or urinary frequency   Physical Exam:  ED Triage Vitals  Enc Vitals Group     BP 07/19/15 1044 156/103 mmHg     Pulse Rate 07/19/15 1044 108     Resp 07/19/15 1044 18     Temp 07/19/15 1044 98 F (36.7 C)     Temp Source 07/19/15 1044 Oral     SpO2 07/19/15 1044 90 %     Weight 07/19/15 1044 182 lb (82.555 kg)     Height 07/19/15 1044 5\' 5"  (1.651 m)     Head Cir --      Peak Flow --      Pain Score 07/19/15 1045 10     Pain Loc --      Pain Edu? --      Excl. in [place]? --     General: Awake , Alert , and Oriented times 3; GCS 15 patient's somewhat ttachypneic while on supplemental oxygen Head: Normal cephalic , atraumatic Eyes: Pupils equal , round, reactive to light Nose/Throat: No nasal drainage, patent upper airway without erythema or exudate. Dry mucous membranes Neck: Supple, Full range of motion, No anterior adenopathy or palpable thyroid masses Lungs: Mild end expiratory wheezing without rales or rhonchi Heart: Regular rate, regular rhythm  without murmurs , gallops , or rubs Abdomen: Soft, non tender without rebound, guarding , or rigidity; bowel sounds hyperactive and symmetric in all 4 quadrants. No organomegaly .       Patient has some mild abdominal distention Extremities: 2 plus symmetric pulses. No edema, clubbing or cyanosis Neurologic: normal ambulation, Motor symmetric without deficits, sensory intact Skin: warm, dry, no rashes   Labs:   All laboratory work was reviewed including any pertinent negatives or positives listed below:  Labs Reviewed  CBC WITH DIFFERENTIAL/PLATELET - Abnormal; Notable for the following:    Hemoglobin 11.3 (*)    HCT 34.2 (*)    MCV 78.1 (*)    MCH 25.9 (*)    RDW 15.3 (*)    Platelets 495 (*)    Neutro Abs 7.8 (*)    All other components within normal limits  BASIC METABOLIC PANEL - Abnormal; Notable for the following:    Sodium 123 (*)    Potassium 3.1 (*)  Chloride 79 (*)    Glucose, Bld 160 (*)    All other components within normal limits   review of laboratory work showed a decreasing sodium and consistently low potassium.  Radiology:   Narrative:    CLINICAL DATA: 82 year old presenting with no bowel movement for several days, with associated at generalized abdominal pain, currently taking hydrocodone for pain related to a fall 2 weeks ago. Patient seen in the emergency department 2 days ago with these same symptoms.  EXAM: DG ABDOMEN ACUTE W/ 1V CHEST  COMPARISON: Two-view abdomen x-ray and CT abdomen and pelvis 07/17/2015. Chest x-rays 07/17/2015 and earlier.  FINDINGS: Moderate gaseous distension of the ascending colon, transverse colon and proximal descending colon, increased since the examinations 2 days ago. Less visible formed stool in the rectum when compared to the examination 2 days ago. Diffuse colonic air-fluid levels indicating liquid stool, not visible on the examination 2 days ago. Gas within normal caliber small bowel loops. No evidence of  free intraperitoneal air. Numerous pelvic phleboliths. No visible opaque urinary tract calculi.  Cardiac silhouette normal in size, unchanged. Chronic interstitial lung disease with focal scarring in the left mid lung, unchanged. No new pulmonary parenchymal abnormalities.  IMPRESSION: 1. Developing colonic ileus. Liquid stool throughout the colon accounting for air-fluid levels. No evidence of obstruction or free intraperitoneal air currently. 2. Chronic interstitial lung disease with focal scarring in the left mid lung. No acute cardiopulmonary disease.         I personally reviewed the radiologic studies    ED Course: * Patient's stay here showed symptomatic improvement after administering a smog enema. The patient had what appears to be a developing colonic ileus. Does not appear to be any signs of a neurologic cause for her constipation. Patient has some hyponatremia at baseline and may have a slight exacerbation of the abdomen discussed fluids at home. She also has some hypokalemia which may have been contributing to her constipation will be prescribed outpatient potassium. Eyes to follow up with her primary physician was given instructions on constipation.    Assessment:  Functional constipation  Final Clinical Impression:  Final diagnoses:  Constipation, unspecified constipation type     Plan: * Patient may need colonoscopy to confirm the constipation is not from a colonic lesion. She will also need follow-up for her potassium and sodium levels. She was advised continue all of her current medications at this time and to drink plenty of fluids.           Daymon Larsen, MD 07/19/15 [id number]

## 2015-07-19 NOTE — ED Notes (Signed)
Pt assisted to bathroom

## 2015-07-19 NOTE — Discharge Instructions (Signed)
Constipation, Adult °Constipation is when a person has fewer than three bowel movements a week, has difficulty having a bowel movement, or has stools that are dry, hard, or larger than normal. As people grow older, constipation is more common. A low-fiber diet, not taking in enough fluids, and taking certain medicines may make constipation worse.  °CAUSES  °· Certain medicines, such as antidepressants, pain medicine, iron supplements, antacids, and water pills.   °· Certain diseases, such as diabetes, irritable bowel syndrome (IBS), thyroid disease, or depression.   °· Not drinking enough water.   °· Not eating enough fiber-rich foods.   °· Stress or travel.   °· Lack of physical activity or exercise.   °· Ignoring the urge to have a bowel movement.   °· Using laxatives too much.   °SIGNS AND SYMPTOMS  °· Having fewer than three bowel movements a week.   °· Straining to have a bowel movement.   °· Having stools that are hard, dry, or larger than normal.   °· Feeling full or bloated.   °· Pain in the lower abdomen.   °· Not feeling relief after having a bowel movement.   °DIAGNOSIS  °Your health care provider will take a medical history and perform a physical exam. Further testing may be done for severe constipation. Some tests may include: °· A barium enema X-ray to examine your rectum, colon, and, sometimes, your small intestine.   °· A sigmoidoscopy to examine your lower colon.   °· A colonoscopy to examine your entire colon. °TREATMENT  °Treatment will depend on the severity of your constipation and what is causing it. Some dietary treatments include drinking more fluids and eating more fiber-rich foods. Lifestyle treatments may include regular exercise. If these diet and lifestyle recommendations do not help, your health care provider may recommend taking over-the-counter laxative medicines to help you have bowel movements. Prescription medicines may be prescribed if over-the-counter medicines do not work.    °HOME CARE INSTRUCTIONS  °· Eat foods that have a lot of fiber, such as fruits, vegetables, whole grains, and beans. °· Limit foods high in fat and processed sugars, such as french fries, hamburgers, cookies, candies, and soda.   °· A fiber supplement may be added to your diet if you cannot get enough fiber from foods.   °· Drink enough fluids to keep your urine clear or pale yellow.   °· Exercise regularly or as directed by your health care provider.   °· Go to the restroom when you have the urge to go. Do not hold it.   °· Only take over-the-counter or prescription medicines as directed by your health care provider. Do not take other medicines for constipation without talking to your health care provider first.   °SEEK IMMEDIATE MEDICAL CARE IF:  °· You have bright red blood in your stool.   °· Your constipation lasts for more than 4 days or gets worse.   °· You have abdominal or rectal pain.   °· You have thin, pencil-like stools.   °· You have unexplained weight loss. °MAKE SURE YOU:  °· Understand these instructions. °· Will watch your condition. °· Will get help right away if you are not doing well or get worse. °  °This information is not intended to replace advice given to you by your health care provider. Make sure you discuss any questions you have with your health care provider. °  °Document Released: 02/29/2004 Document Revised: 06/23/2014 Document Reviewed: 03/14/2013 °Elsevier Interactive Patient Education ©2016 [employer] ° °Please return immediately if condition worsens. Please contact her primary physician or the physician you were given for referral.   If you have any specialist physicians involved in her treatment and plan please also contact them. Thank you for using [facility]. ° °

## 2015-07-19 NOTE — ED Notes (Signed)
Pt arrives via EMS from home, states abd pain from impaction, unsure of last BM, states she was seen in ER recently for same complaint and states she was only given a laxative

## 2015-07-20 ENCOUNTER — Telehealth: Payer: Self-pay | Admitting: Family Medicine

## 2015-07-20 ENCOUNTER — Other Ambulatory Visit: Payer: Self-pay | Admitting: Family Medicine

## 2015-07-20 DIAGNOSIS — E876 Hypokalemia: Secondary | ICD-10-CM

## 2015-07-20 MED ORDER — POTASSIUM CHLORIDE ER 10 MEQ PO TBCR: 10.0000 meq | EXTENDED_RELEASE_TABLET | Freq: Two times a day (BID) | ORAL | Status: DC

## 2015-07-20 NOTE — Telephone Encounter (Signed)
Please speak with patient and husband:  1) I have sent a new prescription to their [facility] Potassium 10 mEQ take [place] (this is a change from [facility]'s current two twice a day dosing).   2) I have ordered a repeat BMP lab work to recheck her potassium sometime next week. They can pick up the lab slip at the front desk.    They can schedule a f/u with me a few weeks out which should have more availability. I will manage her needs without a visit until then.

## 2015-07-20 NOTE — Telephone Encounter (Signed)
[name] (husband) states patient was seen at [facility] on this past Tuesday. Was given something for constipation (which is doing better) and also medication for potassium. They are needing a follow up visit with dr Nadine Counts for next week. The hospital only gave her a 5 day supply of the potassium and she will be completely out by the weekend. Please advise as to where to place the patient because there main concern is the potassium medication.

## 2015-07-20 NOTE — Telephone Encounter (Signed)
Patient notified

## 2015-07-21 ENCOUNTER — Inpatient Hospital Stay
Admission: EM | Admit: 2015-07-21 | Discharge: 2015-07-26 | DRG: 640 | Disposition: A | Discharge status: Home / Self Care | Payer: Medicare Other | Attending: Internal Medicine | Admitting: Internal Medicine

## 2015-07-21 ENCOUNTER — Emergency Department: Payer: Medicare Other

## 2015-07-21 ENCOUNTER — Encounter: Payer: Self-pay | Admitting: *Deleted

## 2015-07-21 DIAGNOSIS — K5909 Other constipation: Secondary | ICD-10-CM | POA: Diagnosis present

## 2015-07-21 DIAGNOSIS — R1110 Vomiting, unspecified: Secondary | ICD-10-CM | POA: Diagnosis present

## 2015-07-21 DIAGNOSIS — J45909 Unspecified asthma, uncomplicated: Secondary | ICD-10-CM | POA: Diagnosis present

## 2015-07-21 DIAGNOSIS — R109 Unspecified abdominal pain: Secondary | ICD-10-CM | POA: Diagnosis not present

## 2015-07-21 DIAGNOSIS — F3342 Major depressive disorder, recurrent, in full remission: Secondary | ICD-10-CM | POA: Diagnosis present

## 2015-07-21 DIAGNOSIS — E876 Hypokalemia: Secondary | ICD-10-CM | POA: Diagnosis present

## 2015-07-21 DIAGNOSIS — F17210 Nicotine dependence, cigarettes, uncomplicated: Secondary | ICD-10-CM | POA: Diagnosis present

## 2015-07-21 DIAGNOSIS — G9341 Metabolic encephalopathy: Secondary | ICD-10-CM | POA: Diagnosis present

## 2015-07-21 DIAGNOSIS — R0602 Shortness of breath: Secondary | ICD-10-CM

## 2015-07-21 DIAGNOSIS — E119 Type 2 diabetes mellitus without complications: Secondary | ICD-10-CM | POA: Diagnosis present

## 2015-07-21 DIAGNOSIS — J9610 Chronic respiratory failure, unspecified whether with hypoxia or hypercapnia: Secondary | ICD-10-CM | POA: Diagnosis present

## 2015-07-21 DIAGNOSIS — E871 Hypo-osmolality and hyponatremia: Secondary | ICD-10-CM

## 2015-07-21 DIAGNOSIS — Z7984 Long term (current) use of oral hypoglycemic drugs: Secondary | ICD-10-CM | POA: Diagnosis not present

## 2015-07-21 DIAGNOSIS — Z7982 Long term (current) use of aspirin: Secondary | ICD-10-CM | POA: Diagnosis not present

## 2015-07-21 DIAGNOSIS — M1990 Unspecified osteoarthritis, unspecified site: Secondary | ICD-10-CM | POA: Diagnosis present

## 2015-07-21 DIAGNOSIS — J449 Chronic obstructive pulmonary disease, unspecified: Secondary | ICD-10-CM | POA: Diagnosis present

## 2015-07-21 DIAGNOSIS — K5901 Slow transit constipation: Secondary | ICD-10-CM | POA: Diagnosis not present

## 2015-07-21 DIAGNOSIS — E785 Hyperlipidemia, unspecified: Secondary | ICD-10-CM | POA: Diagnosis present

## 2015-07-21 DIAGNOSIS — I10 Essential (primary) hypertension: Secondary | ICD-10-CM | POA: Diagnosis present

## 2015-07-21 DIAGNOSIS — R631 Polydipsia: Secondary | ICD-10-CM | POA: Diagnosis present

## 2015-07-21 DIAGNOSIS — R251 Tremor, unspecified: Secondary | ICD-10-CM | POA: Diagnosis present

## 2015-07-21 HISTORY — DX: Major depressive disorder, recurrent, in full remission: F33.42

## 2015-07-21 LAB — COMPREHENSIVE METABOLIC PANEL
ALBUMIN: 4 g/dL (ref 3.5–5.0)
ALT: 16 U/L (ref 14–54)
ANION GAP: 14 (ref 5–15)
AST: 22 U/L (ref 15–41)
Alkaline Phosphatase: 126 U/L (ref 38–126)
BUN: 9 mg/dL (ref 6–20)
CHLORIDE: 72 mmol/L — AB (ref 101–111)
CO2: 26 mmol/L (ref 22–32)
Calcium: 9.7 mg/dL (ref 8.9–10.3)
Creatinine, Ser: 0.4 mg/dL — ABNORMAL LOW (ref 0.44–1.00)
GFR calc Af Amer: >60 mL/min (ref >60)
GFR calc non Af Amer: >60 mL/min (ref >60)
GLUCOSE: 161 mg/dL — AB (ref 65–99)
POTASSIUM: 2.9 mmol/L — AB (ref 3.5–5.1)
SODIUM: 112 mmol/L — AB (ref 135–145)
TOTAL PROTEIN: 7.7 g/dL (ref 6.5–8.1)
Total Bilirubin: 1 mg/dL (ref 0.3–1.2)

## 2015-07-21 LAB — CBC
HEMATOCRIT: 35 % (ref 35.0–47.0)
Hemoglobin: 11.6 g/dL — ABNORMAL LOW (ref 12.0–16.0)
MCH: 25.8 pg — ABNORMAL LOW (ref 26.0–34.0)
MCHC: 33.3 g/dL (ref 32.0–36.0)
MCV: 77.6 fL — ABNORMAL LOW (ref 80.0–100.0)
PLATELETS: 524 10*3/uL — AB (ref 150–440)
RBC: 4.5 MIL/uL (ref 3.80–5.20)
RDW: 15.4 % — AB (ref 11.5–14.5)
WBC: 9.3 10*3/uL (ref 3.6–11.0)

## 2015-07-21 LAB — TROPONIN I: Troponin I: 0.03 ng/mL (ref <0.031)

## 2015-07-21 MED ORDER — ASPIRIN EC 81 MG PO TBEC
81.0000 mg | DELAYED_RELEASE_TABLET | Freq: Every day | ORAL | Status: DC
  Administered 2015-07-22 – 2015-07-25 (×5): 81 mg via ORAL
  Filled 2015-07-21 (×5): qty 1

## 2015-07-21 MED ORDER — IOHEXOL 300 MG/ML  SOLN
100.0000 mL | Freq: Once | INTRAMUSCULAR | Status: AC | PRN
  Administered 2015-07-21: 100 mL via INTRAVENOUS

## 2015-07-21 MED ORDER — MELOXICAM 7.5 MG PO TABS
15.0000 mg | ORAL_TABLET | Freq: Every day | ORAL | Status: DC
  Administered 2015-07-22 – 2015-07-26 (×5): 15 mg via ORAL
  Filled 2015-07-21 (×5): qty 2

## 2015-07-21 MED ORDER — GABAPENTIN 300 MG PO CAPS
300.0000 mg | ORAL_CAPSULE | Freq: Every day | ORAL | Status: DC
  Administered 2015-07-22 – 2015-07-26 (×5): 300 mg via ORAL
  Filled 2015-07-21 (×5): qty 1

## 2015-07-21 MED ORDER — HEPARIN SODIUM (PORCINE) 5000 UNIT/ML IJ SOLN
5000.0000 [IU] | Freq: Three times a day (TID) | INTRAMUSCULAR | Status: DC
  Administered 2015-07-22 (×3): 5000 [IU] via SUBCUTANEOUS
  Filled 2015-07-21 (×3): qty 1

## 2015-07-21 MED ORDER — IOHEXOL 240 MG/ML SOLN
25.0000 mL | Freq: Once | INTRAMUSCULAR | Status: AC | PRN
  Administered 2015-07-21: 25 mL via ORAL

## 2015-07-21 MED ORDER — DIAZEPAM 5 MG PO TABS
5.0000 mg | ORAL_TABLET | Freq: Three times a day (TID) | ORAL | Status: DC | PRN
  Administered 2015-07-22 – 2015-07-23 (×3): 5 mg via ORAL
  Filled 2015-07-21 (×3): qty 1

## 2015-07-21 MED ORDER — ONDANSETRON HCL 4 MG PO TABS: 4.0000 mg | ORAL_TABLET | Freq: Four times a day (QID) | ORAL | Status: DC | PRN

## 2015-07-21 MED ORDER — METFORMIN HCL 500 MG PO TABS
1000.0000 mg | ORAL_TABLET | Freq: Two times a day (BID) | ORAL | Status: DC
  Administered 2015-07-22: 08:00:00 1000 mg via ORAL
  Filled 2015-07-21: qty 2

## 2015-07-21 MED ORDER — SODIUM CHLORIDE 0.9 % IV BOLUS (SEPSIS): 1000.0000 mL | Freq: Once | INTRAVENOUS | Status: DC

## 2015-07-21 MED ORDER — GABAPENTIN 300 MG PO CAPS
600.0000 mg | ORAL_CAPSULE | Freq: Every day | ORAL | Status: DC
  Administered 2015-07-21 – 2015-07-25 (×5): 600 mg via ORAL
  Filled 2015-07-21 (×5): qty 2

## 2015-07-21 MED ORDER — HYDROCODONE-ACETAMINOPHEN 5-325 MG PO TABS
1.0000 | ORAL_TABLET | Freq: Two times a day (BID) | ORAL | Status: DC | PRN
  Administered 2015-07-23 – 2015-07-25 (×2): 1 via ORAL
  Filled 2015-07-21 (×2): qty 1

## 2015-07-21 MED ORDER — MAGNESIUM CITRATE PO SOLN
1.0000 | Freq: Once | ORAL | Status: DC | PRN
  Filled 2015-07-21: qty 296

## 2015-07-21 MED ORDER — TIOTROPIUM BROMIDE MONOHYDRATE 18 MCG IN CAPS
18.0000 ug | ORAL_CAPSULE | Freq: Every day | RESPIRATORY_TRACT | Status: DC
  Administered 2015-07-22 – 2015-07-26 (×5): 18 ug via RESPIRATORY_TRACT
  Filled 2015-07-21 (×2): qty 5

## 2015-07-21 MED ORDER — SODIUM CHLORIDE 0.9% FLUSH: 3.0000 mL | Freq: Two times a day (BID) | INTRAVENOUS | Status: DC

## 2015-07-21 MED ORDER — MECLIZINE HCL 25 MG PO TABS: 25.0000 mg | ORAL_TABLET | Freq: Three times a day (TID) | ORAL | Status: DC | PRN

## 2015-07-21 MED ORDER — PRAVASTATIN SODIUM 20 MG PO TABS
20.0000 mg | ORAL_TABLET | Freq: Every day | ORAL | Status: DC
  Administered 2015-07-22 – 2015-07-25 (×4): 20 mg via ORAL
  Filled 2015-07-21 (×4): qty 1

## 2015-07-21 MED ORDER — ONDANSETRON HCL 4 MG/2ML IJ SOLN
4.0000 mg | Freq: Once | INTRAMUSCULAR | Status: AC
  Administered 2015-07-21: 4 mg via INTRAVENOUS
  Filled 2015-07-21: qty 2

## 2015-07-21 MED ORDER — POTASSIUM CHLORIDE CRYS ER 20 MEQ PO TBCR
40.0000 meq | EXTENDED_RELEASE_TABLET | Freq: Once | ORAL | Status: AC
  Administered 2015-07-21: 40 meq via ORAL
  Filled 2015-07-21: qty 2

## 2015-07-21 MED ORDER — CLONIDINE HCL 0.1 MG PO TABS
0.1000 mg | ORAL_TABLET | Freq: Two times a day (BID) | ORAL | Status: DC
  Administered 2015-07-21 – 2015-07-26 (×10): 0.1 mg via ORAL
  Filled 2015-07-21 (×10): qty 1

## 2015-07-21 MED ORDER — SODIUM CHLORIDE 0.9 % IV SOLN
Freq: Once | INTRAVENOUS | Status: AC
  Administered 2015-07-21: 19:00:00 via INTRAVENOUS

## 2015-07-21 MED ORDER — DOCUSATE SODIUM 100 MG PO CAPS
100.0000 mg | ORAL_CAPSULE | Freq: Two times a day (BID) | ORAL | Status: DC
  Administered 2015-07-21 – 2015-07-26 (×10): 100 mg via ORAL
  Filled 2015-07-21 (×10): qty 1

## 2015-07-21 MED ORDER — GEMFIBROZIL 600 MG PO TABS
600.0000 mg | ORAL_TABLET | Freq: Two times a day (BID) | ORAL | Status: DC
Start: 2015-07-22 — End: 2015-07-26
  Administered 2015-07-22 – 2015-07-26 (×9): 600 mg via ORAL
  Filled 2015-07-21 (×9): qty 1

## 2015-07-21 MED ORDER — GLIMEPIRIDE 2 MG PO TABS
4.0000 mg | ORAL_TABLET | Freq: Two times a day (BID) | ORAL | Status: DC
  Administered 2015-07-22 – 2015-07-23 (×3): 4 mg via ORAL
  Filled 2015-07-21 (×3): qty 2

## 2015-07-21 MED ORDER — MAGNESIUM HYDROXIDE 400 MG/5ML PO SUSP: 30.0000 mL | Freq: Every day | ORAL | Status: DC | PRN

## 2015-07-21 MED ORDER — CITALOPRAM HYDROBROMIDE 20 MG PO TABS
20.0000 mg | ORAL_TABLET | Freq: Every day | ORAL | Status: DC
  Administered 2015-07-22 – 2015-07-25 (×5): 20 mg via ORAL
  Filled 2015-07-21 (×5): qty 1

## 2015-07-21 MED ORDER — ALBUTEROL SULFATE HFA 108 (90 BASE) MCG/ACT IN AERS: 2.0000 | INHALATION_SPRAY | RESPIRATORY_TRACT | Status: DC | PRN

## 2015-07-21 MED ORDER — LACTULOSE 10 GM/15ML PO SOLN
20.0000 g | Freq: Once | ORAL | Status: AC
  Administered 2015-07-21: 20 g via ORAL
  Filled 2015-07-21: qty 30

## 2015-07-21 MED ORDER — ONDANSETRON HCL 4 MG/2ML IJ SOLN: 4.0000 mg | Freq: Four times a day (QID) | INTRAMUSCULAR | Status: DC | PRN

## 2015-07-21 MED ORDER — THEOPHYLLINE ER 400 MG PO TB24
400.0000 mg | ORAL_TABLET | Freq: Every day | ORAL | Status: DC
  Administered 2015-07-23 – 2015-07-25 (×3): 400 mg via ORAL
  Filled 2015-07-21 (×7): qty 1

## 2015-07-21 MED ORDER — ALBUTEROL SULFATE (2.5 MG/3ML) 0.083% IN NEBU: 2.5000 mg | INHALATION_SOLUTION | RESPIRATORY_TRACT | Status: DC | PRN

## 2015-07-21 MED ORDER — POTASSIUM CHLORIDE ER 10 MEQ PO TBCR
10.0000 meq | EXTENDED_RELEASE_TABLET | Freq: Two times a day (BID) | ORAL | Status: DC
  Administered 2015-07-22: 10 meq via ORAL
  Filled 2015-07-21 (×3): qty 1

## 2015-07-21 MED ORDER — CYCLOBENZAPRINE HCL 10 MG PO TABS: 5.0000 mg | ORAL_TABLET | Freq: Three times a day (TID) | ORAL | Status: DC | PRN

## 2015-07-21 MED ORDER — HYDROCHLOROTHIAZIDE 25 MG PO TABS
25.0000 mg | ORAL_TABLET | Freq: Every day | ORAL | Status: DC
  Administered 2015-07-22 – 2015-07-24 (×3): 25 mg via ORAL
  Filled 2015-07-21 (×3): qty 1

## 2015-07-21 MED ORDER — MONTELUKAST SODIUM 10 MG PO TABS
10.0000 mg | ORAL_TABLET | Freq: Every day | ORAL | Status: DC
  Administered 2015-07-22 – 2015-07-25 (×5): 10 mg via ORAL
  Filled 2015-07-21 (×5): qty 1

## 2015-07-21 MED ORDER — NYSTATIN 100000 UNIT/ML MT SUSP
5.0000 mL | Freq: Four times a day (QID) | OROMUCOSAL | Status: DC
Start: 2015-07-21 — End: 2015-07-26
  Administered 2015-07-22 – 2015-07-26 (×15): 500000 [IU] via ORAL
  Filled 2015-07-21 (×12): qty 5

## 2015-07-21 MED ORDER — POLYETHYLENE GLYCOL 3350 17 G PO PACK
17.0000 g | PACK | Freq: Two times a day (BID) | ORAL | Status: DC
  Administered 2015-07-22 – 2015-07-26 (×8): 17 g via ORAL
  Filled 2015-07-21 (×8): qty 1

## 2015-07-21 MED ORDER — LORATADINE 10 MG PO TABS
10.0000 mg | ORAL_TABLET | Freq: Every day | ORAL | Status: DC
Start: 2015-07-22 — End: 2015-07-26
  Administered 2015-07-22 – 2015-07-26 (×5): 10 mg via ORAL
  Filled 2015-07-21 (×5): qty 1

## 2015-07-21 MED ORDER — POTASSIUM CHLORIDE IN NACL 20-0.9 MEQ/L-% IV SOLN
INTRAVENOUS | Status: DC
  Administered 2015-07-21 – 2015-07-22 (×2): via INTRAVENOUS
  Filled 2015-07-21 (×4): qty 1000

## 2015-07-21 MED ORDER — BISACODYL 10 MG RE SUPP
10.0000 mg | Freq: Once | RECTAL | Status: AC
  Administered 2015-07-21: 10 mg via RECTAL
  Filled 2015-07-21: qty 1

## 2015-07-21 MED ORDER — GABAPENTIN 300 MG PO CAPS: 300.0000 mg | ORAL_CAPSULE | ORAL | Status: DC

## 2015-07-21 NOTE — ED Notes (Signed)
Lab called potassium of 2.9 and sodium of 112.  Charge nurse  [facility]  [place]

## 2015-07-21 NOTE — ED Provider Notes (Addendum)
[facility] Emergency Department Provider Note  ____________________________________________   I have reviewed the triage vital signs and the nursing notes.   HISTORY  Chief Complaint Abdominal Pain; Constipation; Chest Pain; and Fall    HPI Jocelyn Burgess is a 82 y.o. female with multiple medical problems diabetes mellitus, chronic constipation, hypertension, hyponatremia presents today with constipation. She was recently seen here, she did have a bowel movement at that time but since that time has not had a bowel movement. She has diffuse abdominal discomfort. She denies any fever or chills. She feels generally weak. She has been vomiting but only since arrival here it appears. Patient states that she has been feeling generally weak. He states he may fall and yesterday but she did not hit her head. There is been no fever or chills. The patient does drink a half gallon of water a day. She has a baseline tremor which seems worse over the last 6 weeks. No other acute neurologic deficit.  Past Medical History  Diagnosis Date  . Allergy   . Cancer ([place])   . Diabetes mellitus without complication ([place])   . Arthritis   . Asthma   . Cataract   . COPD (chronic obstructive pulmonary disease) ([place])   . Depression     sometimes  . Hyperlipidemia   . Hypertension   . Oxygen deficiency   . Major depressive disorder, recurrent, in full remission with anxious distress ([place]) 11/23/2014    Patient Active Problem List   Diagnosis Date Noted  . Left hip pain 07/09/2015  . Benign essential tremor 07/09/2015  . Venous stasis dermatitis of both lower extremities 07/03/2015  . Bilateral edema of lower extremity 05/30/2015  . Bleeding nose 05/08/2015  . Morning headache 05/01/2015  . Intermittent confusion 04/17/2015  . Cough 04/17/2015  . Constipation, slow transit 04/09/2015  . Allergic rhinitis with postnasal drip 04/09/2015  . Hypertension goal BP (blood pressure) <  140/90 01/22/2015  . Major depressive disorder, recurrent, in full remission with anxious distress ([place]) 11/23/2014  . Bronchitis with chronic airway obstruction ([place]) 11/23/2014  . Heavy cigarette smoker 11/23/2014  . COPD, severe ([place]) 11/23/2014  . DD (diverticular disease) 11/23/2014  . Personal history of malignant neoplasm of breast 11/23/2014  . History of digestive disease 11/23/2014  . Personal history of healed traumatic fracture 11/23/2014  . HLD (hyperlipidemia) 11/23/2014  . Peripheral neuropathic pain ([place]) 11/23/2014  . At risk for falling 11/23/2014  . Diabetes mellitus type 2, controlled, with complications ([place]) [id number]    Past Surgical History  Procedure Laterality Date  . Breast surgery Left     Current Outpatient Rx  Name  Route  Sig  Dispense  Refill  . albuterol (PROVENTIL HFA;VENTOLIN HFA) 108 (90 Base) MCG/ACT inhaler   Inhalation   Inhale 2 puffs into the lungs every 4 (four) hours as needed for wheezing or shortness of breath.          Marland Kitchen aspirin EC 81 MG tablet   Oral   Take 81 mg by mouth at bedtime.         . citalopram (CELEXA) 20 MG tablet   Oral   Take 20 mg by mouth at bedtime.          . cloNIDine (CATAPRES) 0.1 MG tablet   Oral   Take 0.1 mg by mouth 2 (two) times daily.         . cyclobenzaprine (FLEXERIL) 5 MG tablet   Oral  Take 1 tablet (5 mg total) by mouth 3 (three) times daily as needed for muscle spasms.   30 tablet   1   . diazepam (VALIUM) 5 MG tablet   Oral   Take 1 tablet (5 mg total) by mouth every 8 (eight) hours as needed for anxiety.   270 tablet   1     90 day supply with 1 refill   . furosemide (LASIX) 20 MG tablet   Oral   Take 20 mg by mouth daily.         Marland Kitchen gabapentin (NEURONTIN) 300 MG capsule   Oral   Take 300 mg by mouth 3 (three) times daily.         Marland Kitchen gemfibrozil (LOPID) 600 MG tablet   Oral   Take 600 mg by mouth 2 (two) times daily before a meal.         . glimepiride  (AMARYL) 4 MG tablet   Oral   Take 2 mg by mouth 2 (two) times daily.          . hydrochlorothiazide (HYDRODIURIL) 25 MG tablet   Oral   Take 25 mg by mouth daily.         Marland Kitchen HYDROcodone-acetaminophen (NORCO) 5-325 MG tablet   Oral   Take 1 tablet by mouth every 12 (twelve) hours as needed for moderate pain.   60 tablet   0     Printed 07/09/15   . loratadine (CLARITIN) 10 MG tablet   Oral   Take 10 mg by mouth daily.          Marland Kitchen lovastatin (MEVACOR) 20 MG tablet   Oral   Take 20 mg by mouth at bedtime.         . magnesium hydroxide (MILK OF MAGNESIA) 400 MG/5ML suspension   Oral   Take 30 mLs by mouth daily as needed for mild constipation.          . meclizine (ANTIVERT) 25 MG tablet   Oral   Take 25 mg by mouth every 8 (eight) hours as needed for dizziness.         . meloxicam (MOBIC) 15 MG tablet   Oral   Take 1 tablet (15 mg total) by mouth daily.   90 tablet   2   . metFORMIN (GLUCOPHAGE) 1000 MG tablet   Oral   Take 1 tablet (1,000 mg total) by mouth 2 (two) times daily with a meal.   180 tablet   1     Disregard previous doses and RXs for this medicati ...   . mometasone (ELOCON) 0.1 % lotion   Topical   Apply 1 application topically as needed (for dry,itchy skin). Pt applies to both ears as needed.         . montelukast (SINGULAIR) 10 MG tablet   Oral   Take 10 mg by mouth at bedtime.         Marland Kitchen nystatin (MYCOSTATIN) 100000 UNIT/ML suspension   Oral   Take 5 mLs (500,000 Units total) by mouth 4 (four) times daily. Swish retain in mouth long as possible then swallow Patient not taking: Reported on 07/17/2015   120 mL   0   . potassium chloride (K-DUR) 10 MEQ tablet   Oral   Take 1 tablet (10 mEq total) by mouth 2 (two) times daily.   60 tablet   1   . SPIRIVA HANDIHALER 18 MCG inhalation capsule      INHALE  CONTENTS OF 1 CAPSULE ONCE DAILY   90 capsule   1   . theophylline (UNIPHYL) 400 MG 24 hr tablet   Oral   Take 400 mg  by mouth daily.            Allergies Ciprofloxacin; Inhaler decongestant; and Molds & smuts  Family History  Problem Relation Age of Onset  . Arthritis Mother   . Arthritis Father   . Cancer Sister     brain  . Heart disease Sister   . Stroke Sister   . Diabetes Brother     Social History Social History  Substance Use Topics  . Smoking status: Current Every Day Smoker -- 1.00 packs/day for 45 years    Types: Cigarettes  . Smokeless tobacco: Current User  . Alcohol Use: No    Review of Systems Constitutional: No fever/chills he is generally weak Eyes: No visual changes. ENT: No sore throat. No stiff neck no neck pain Cardiovascular: Denies chest pain. Respiratory: Denies shortness of breath. Gastrointestinal:   no vomiting.  No diarrhea.  Positive constipation Genitourinary: Negative for dysuria. Musculoskeletal: Negative lower extremity swelling Skin: Negative for rash. Neurological: Negative for headaches, focal weakness or numbness. 10-point ROS otherwise negative.  ____________________________________________   PHYSICAL EXAM:  VITAL SIGNS: ED Triage Vitals  Enc Vitals Group     BP 07/21/15 1647 156/102 mmHg     Pulse Rate 07/21/15 1647 104     Resp 07/21/15 1647 20     Temp 07/21/15 1647 97.9 F (36.6 C)     Temp Source 07/21/15 1647 Oral     SpO2 07/21/15 1647 95 %     Weight 07/21/15 1647 185 lb (83.915 kg)     Height 07/21/15 1647 5\' 8"  (1.727 m)     Head Cir --      Peak Flow --      Pain Score 07/21/15 1649 10     Pain Loc --      Pain Edu? --      Excl. in [place]? --     Constitutional: Alert and oriented. He'll [place] no acute distress Eyes: Conjunctivae are normal. PERRL. EOMI. Head: Atraumatic. Nose: No congestion/rhinnorhea. Mouth/Throat: Mucous membranes are moist.  Oropharynx non-erythematous. Neck: No stridor.   Nontender with no meningismus Cardiovascular: Normal rate, regular rhythm. Grossly normal heart sounds.  Good peripheral  circulation. Respiratory: Normal respiratory effort.  No retractions. Lungs CTAB. Abdominal: Mild diffuse tenderness no focal tenderness. Nonsurgical abdomen. Back:  There is no focal tenderness or step off there is no midline tenderness there are no lesions noted. there is no CVA tenderness Musculoskeletal: No lower extremity tenderness. No joint effusions, no DVT signs strong distal pulses no edema Neurologic:  Normal speech and language. \tremor noted  Skin:  Skin is warm, dry and intact. No rash noted. Psychiatric: Mood and affect are normal. Speech and behavior are normal.  ____________________________________________   LABS (all labs ordered are listed, but only abnormal results are displayed)  Labs Reviewed  CBC - Abnormal; Notable for the following:    Hemoglobin 11.6 (*)    MCV 77.6 (*)    MCH 25.8 (*)    RDW 15.4 (*)    Platelets 524 (*)    All other components within normal limits  COMPREHENSIVE METABOLIC PANEL - Abnormal; Notable for the following:    Sodium 112 (*)    Potassium 2.9 (*)    Chloride 72 (*)    Glucose, Bld 161 (*)  Creatinine, Ser 0.40 (*)    All other components within normal limits  TROPONIN I   ____________________________________________  EKG  I personally interpreted any EKGs ordered by me or triage S tachycardia rate 103 no acute ST elevation nonspecific ST changes noted no acute ischemic changes ____________________________________________  RADIOLOGY  I reviewed any imaging ordered by me or triage that were performed during my shift ____________________________________________   PROCEDURES  Procedure(s) performed: None  Critical Care performed: CRITICAL CARE Performed by: Schuyler Amor   Total critical care time: 40 minutes  Critical care time was exclusive of separately billable procedures and treating other patients.  Critical care was necessary to treat or prevent imminent or life-threatening deterioration.  Critical  care was time spent personally by me on the following activities: development of treatment plan with patient and/or surrogate as well as nursing, discussions with consultants, evaluation of patient's response to treatment, examination of patient, obtaining history from patient or surrogate, ordering and performing treatments and interventions, ordering and review of laboratory studies, ordering and review of radiographic studies, pulse oximetry and re-evaluation of patient's condition.   ____________________________________________   INITIAL IMPRESSION / ASSESSMENT AND PLAN / ED COURSE  Pertinent labs & imaging results that were available during my care of the patient were reviewed by me and considered in my medical decision making (see chart for details).  Patient drinks a half gallon of water a day and has significant hyponatremia as well as hypokalemia. We're replacing the potassium and we are giving her gentle IV fluid, she will need fluid restriction and admission. CT of the abdomen is negative. Exam otherwise reassuring. No evidence of acute traumatic injury. ____________________________________________   FINAL CLINICAL IMPRESSION(S) / ED DIAGNOSES  Final diagnoses:  None   profound hyponatremia    This chart was dictated using voice recognition software.  Despite best efforts to proofread,  errors can occur which can change meaning.     Schuyler Amor, MD 07/21/15 1933  Schuyler Amor, MD 07/21/15 [phone]

## 2015-07-21 NOTE — ED Notes (Signed)
Up to bathroom with 2 person assist.

## 2015-07-21 NOTE — ED Notes (Signed)
Pt 2 person assist up to commode.  Voided w/o difficulty. Moderate amount of incontinence in bed and on peripad.  Pt cleaned, changed into new gown and fall risk yellow slippers placed on pt.  Clothing given to husband in belongings bag to take home.

## 2015-07-21 NOTE — ED Notes (Signed)
Admitting MD at bedside.

## 2015-07-21 NOTE — H&P (Signed)
History and Physical    Jocelyn Burgess [id number] DOB: 1931-11-25 DOA: 07/21/2015  Referring physician: Dr. Burlene Arnt PCP: Bobetta Lime, MD  Specialists: none  Chief Complaint: abdominal pain  HPI: Jocelyn Burgess is a 82 y.o. female has a past medical history significant for COPD, DM, and depression who was seen in ER 3 days ago with constipation. Was sent home and now presents with worsening abdominal pain with persistent constipation. Has been drinking an extreme amount of water at home hoping to help her sx's. In ER, patient was noted to have severe hyponatremia and moderate hypokalemia. She is now admitted.  Review of Systems: The patient denies anorexia, fever, weight loss,, vision loss, decreased hearing, hoarseness, chest pain, syncope, dyspnea on exertion, peripheral edema, balance deficits, hemoptysis, melena, hematochezia, severe indigestion/heartburn, hematuria, incontinence, genital sores, muscle weakness, suspicious skin lesions, transient blindness, difficulty walking, depression, unusual weight change, abnormal bleeding, enlarged lymph nodes, angioedema, and breast masses.   Past Medical History  Diagnosis Date  . Allergy   . Cancer ([place])   . Diabetes mellitus without complication ([place])   . Arthritis   . Asthma   . Cataract   . COPD (chronic obstructive pulmonary disease) ([place])   . Depression     sometimes  . Hyperlipidemia   . Hypertension   . Oxygen deficiency   . Major depressive disorder, recurrent, in full remission with anxious distress ([place]) 11/23/2014   Past Surgical History  Procedure Laterality Date  . Breast surgery Left    Social History:  reports that she has been smoking Cigarettes.  She has a 45 pack-year smoking history. She uses smokeless tobacco. She reports that she does not drink alcohol or use illicit drugs.  Allergies  Allergen Reactions  . Ciprofloxacin Other (See Comments)    Reaction:  Unknown   . Inhaler Decongestant  [Methamphetamine] Other (See Comments)    Reaction:  Unknown   . Molds & Smuts Other (See Comments)    Reaction:  Wheezing/itchy,watery eyes     Family History  Problem Relation Age of Onset  . Arthritis Mother   . Arthritis Father   . Cancer Sister     brain  . Heart disease Sister   . Stroke Sister   . Diabetes Brother     Prior to Admission medications   Medication Sig Start Date End Date Taking? Authorizing Provider  albuterol (PROVENTIL HFA;VENTOLIN HFA) 108 (90 Base) MCG/ACT inhaler Inhale 2 puffs into the lungs every 4 (four) hours as needed for wheezing or shortness of breath.    Yes Historical Provider, MD  aspirin EC 81 MG tablet Take 81 mg by mouth at bedtime.   Yes Historical Provider, MD  citalopram (CELEXA) 20 MG tablet Take 20 mg by mouth at bedtime.    Yes Historical Provider, MD  cloNIDine (CATAPRES) 0.1 MG tablet Take 0.1 mg by mouth 2 (two) times daily.   Yes Historical Provider, MD  cyclobenzaprine (FLEXERIL) 5 MG tablet Take 1 tablet (5 mg total) by mouth 3 (three) times daily as needed for muscle spasms. 07/13/15  Yes Bobetta Lime, MD  diazepam (VALIUM) 5 MG tablet Take 1 tablet (5 mg total) by mouth every 8 (eight) hours as needed for anxiety. 07/09/15  Yes Bobetta Lime, MD  furosemide (LASIX) 20 MG tablet Take 20 mg by mouth every morning.    Yes Historical Provider, MD  gabapentin (NEURONTIN) 300 MG capsule Take 300-600 mg by mouth See admin instructions. Take 1 capsule by  mouth in the morning and take 2 capsules (600mg ) by mouth every night at bedtime.   Yes Historical Provider, MD  gemfibrozil (LOPID) 600 MG tablet Take 600 mg by mouth 2 (two) times daily before a meal.   Yes Historical Provider, MD  glimepiride (AMARYL) 4 MG tablet Take 4 mg by mouth 2 (two) times daily.    Yes Historical Provider, MD  hydrochlorothiazide (HYDRODIURIL) 25 MG tablet Take 25 mg by mouth daily.   Yes Historical Provider, MD  HYDROcodone-acetaminophen (NORCO) 5-325 MG tablet  Take 1 tablet by mouth every 12 (twelve) hours as needed for moderate pain. 07/09/15  Yes Bobetta Lime, MD  loratadine (CLARITIN) 10 MG tablet Take 10 mg by mouth daily.    Yes Historical Provider, MD  lovastatin (MEVACOR) 20 MG tablet Take 20 mg by mouth at bedtime.   Yes Historical Provider, MD  magnesium hydroxide (MILK OF MAGNESIA) 400 MG/5ML suspension Take 30 mLs by mouth daily as needed for mild constipation.    Yes Historical Provider, MD  meclizine (ANTIVERT) 25 MG tablet Take 25 mg by mouth every 8 (eight) hours as needed for dizziness.   Yes Historical Provider, MD  meloxicam (MOBIC) 15 MG tablet Take 1 tablet (15 mg total) by mouth daily. 05/01/15  Yes Bobetta Lime, MD  metFORMIN (GLUCOPHAGE) 1000 MG tablet Take 1 tablet (1,000 mg total) by mouth 2 (two) times daily with a meal. 07/03/15  Yes Bobetta Lime, MD  mometasone (ELOCON) 0.1 % lotion Apply 1 application topically as needed (for dry,itchy skin). Pt applies to both ears as needed.   Yes Historical Provider, MD  montelukast (SINGULAIR) 10 MG tablet Take 10 mg by mouth at bedtime.   Yes Historical Provider, MD  nystatin (MYCOSTATIN) 100000 UNIT/ML suspension Take 5 mLs (500,000 Units total) by mouth 4 (four) times daily. Swish retain in mouth long as possible then swallow 05/23/15  Yes Bobetta Lime, MD  potassium chloride (K-DUR) 10 MEQ tablet Take 1 tablet (10 mEq total) by mouth 2 (two) times daily. 07/20/15 07/25/15 Yes Bobetta Lime, MD  SPIRIVA HANDIHALER 18 MCG inhalation capsule INHALE CONTENTS OF 1 CAPSULE ONCE DAILY 07/20/15  Yes Bobetta Lime, MD  theophylline (UNIPHYL) 400 MG 24 hr tablet Take 400 mg by mouth daily.    Yes Historical Provider, MD   Physical Exam: Filed Vitals:   07/21/15 1647 07/21/15 1908 07/21/15 1930  BP: 156/102 160/111 162/106  Pulse: 104 106 104  Temp: 97.9 F (36.6 C)    TempSrc: Oral    Resp: 20 20 23   Height: 5\' 8"  (1.727 m)    Weight: 83.915 kg (185 lb)    SpO2: 95% 96% 98%      General:  No apparent distress, WDWN  Eyes: PERRL, EOMI, no scleral icterus, conjunctiva clear  ENT: moist oropharynx with fair dentition  Neck: supple, no lymphadenopathy,no thyromegaly. No bruits  Cardiovascular: regular rate without MRG; 2+ peripheral pulses, no JVD, trace peripheral edema  Respiratory: CTA biL, good air movement without wheezing, rhonchi or crackled  Abdomen: mildly distended, non tender to palpation, positive bowel sounds, no guarding, no rebound  Skin: no rashes or lesions  Musculoskeletal: normal bulk and tone, no joint swelling  Psychiatric: normal mood and affect, A&OX3  Neurologic: CN 2-12 grossly intact, Motor strength 5/5 in all 4 groups. Sensory exam normal. DTR's symmetric  Labs on Admission:  Basic Metabolic Panel:  Recent Labs Lab 07/17/15 1545 07/19/15 1339 07/21/15 1652  NA 128* 123* 112*  K  2.9* 3.1* 2.9*  CL 81* 79* 72*  CO2 32 31 26  GLUCOSE 271* 160* 161*  BUN 13 9 9   CREATININE 0.70 0.50 0.40*  CALCIUM 10.1 9.9 9.7   Liver Function Tests:  Recent Labs Lab 07/17/15 1545 07/21/15 1652  AST 26 22  ALT 15 16  ALKPHOS 101 126  BILITOT 0.4 1.0  PROT 8.0 7.7  ALBUMIN 4.2 4.0    Recent Labs Lab 07/17/15 1545  LIPASE 24   No results for input(s): AMMONIA in the last 168 hours. CBC:  Recent Labs Lab 07/17/15 1545 07/19/15 1339 07/21/15 1652  WBC 9.0 10.9 9.3  NEUTROABS 6.8* 7.8*  --   HGB 11.5* 11.3* 11.6*  HCT 34.6* 34.2* 35.0  MCV 78.2* 78.1* 77.6*  PLT 498* 495* 524*   Cardiac Enzymes:  Recent Labs Lab 07/17/15 1545 07/21/15 1652  TROPONINI <0.03 0.03    BNP (last 3 results) No results for input(s): BNP in the last 8760 hours.  ProBNP (last 3 results) No results for input(s): PROBNP in the last 8760 hours.  CBG: No results for input(s): GLUCAP in the last 168 hours.  Radiological Exams on Admission: Ct Abdomen Pelvis W Contrast  07/21/2015  CLINICAL DATA:  Abdominal pain and chest  pain, had enema for constipation 3 days ago, patient fell last night EXAM: CT ABDOMEN AND PELVIS WITH CONTRAST TECHNIQUE: Multidetector CT imaging of the abdomen and pelvis was performed using the standard protocol following bolus administration of intravenous contrast. CONTRAST:  132mL OMNIPAQUE IOHEXOL 300 MG/ML  SOLN COMPARISON:  07/17/2015, 05/01/2007 FINDINGS: Lower chest: 5 mm pulmonary nodule lateral right lung base stable from 2008 and therefore benign. Hepatobiliary: Negative Pancreas: Negative Spleen: Negative Adrenals/Urinary Tract: Mild fullness of both adrenal glands stable likely representing hyperplasia. Tiny low-attenuation bilateral renal lesions stable from 2017 study appearing most consistent with cysts. Bladder distended. Stomach/Bowel: Negative.  Mild to moderate fecal retention. Vascular/Lymphatic: Negative for acute findings. Heavy atherosclerotic calcification of the aortoiliac vessels noted. Reproductive: No acute findings.  Small calcified uterine fibroid. Other: No ascites. Musculoskeletal: No acute findings IMPRESSION: No significant or acute findings. Electronically Signed   By: Skipper Cliche M.D.   On: 07/21/2015 19:16    EKG: Independently reviewed.  Assessment/Plan Principal Problem:   Acute hyponatremia Active Problems:   Constipation, slow transit   Abdominal pain   Hypokalemia   Will admit to floor with IV fluids and follow electrolytes closely. Optimize bowel regimen. Consult GI. Repeat labs in AM.  Diet: clear liquids Fluids: NS with K+ DVT Prophylaxis: SQ Heparin  Code Status: FULL  Family Communication: yes  Disposition Plan: home  Time spent: 55 min

## 2015-07-21 NOTE — ED Notes (Signed)
Pt to triage via wheelchair.  Pt has abd pain and chest pain.  Pt was seen in the ER 3 days ago with constipation and had and enema.  No bm since enema.  Pt also fell again last night.  Pt denies neck or back pain.  No loc.

## 2015-07-22 LAB — CBC
HEMATOCRIT: 33.3 % — AB (ref 35.0–47.0)
Hemoglobin: 11.3 g/dL — ABNORMAL LOW (ref 12.0–16.0)
MCH: 25.8 pg — ABNORMAL LOW (ref 26.0–34.0)
MCHC: 33.9 g/dL (ref 32.0–36.0)
MCV: 76.3 fL — ABNORMAL LOW (ref 80.0–100.0)
PLATELETS: 540 10*3/uL — AB (ref 150–440)
RBC: 4.37 MIL/uL (ref 3.80–5.20)
RDW: 15.1 % — AB (ref 11.5–14.5)
WBC: 8.9 10*3/uL (ref 3.6–11.0)

## 2015-07-22 LAB — COMPREHENSIVE METABOLIC PANEL
ALT: 13 U/L — ABNORMAL LOW (ref 14–54)
ANION GAP: 11 (ref 5–15)
AST: 20 U/L (ref 15–41)
Albumin: 3.9 g/dL (ref 3.5–5.0)
Alkaline Phosphatase: 114 U/L (ref 38–126)
BILIRUBIN TOTAL: 0.5 mg/dL (ref 0.3–1.2)
BUN: 7 mg/dL (ref 6–20)
CHLORIDE: 82 mmol/L — AB (ref 101–111)
CO2: 27 mmol/L (ref 22–32)
Calcium: 9.2 mg/dL (ref 8.9–10.3)
Creatinine, Ser: 0.36 mg/dL — ABNORMAL LOW (ref 0.44–1.00)
GFR calc Af Amer: >60 mL/min (ref >60)
GFR calc non Af Amer: >60 mL/min (ref >60)
GLUCOSE: 103 mg/dL — AB (ref 65–99)
POTASSIUM: 3.1 mmol/L — AB (ref 3.5–5.1)
SODIUM: 120 mmol/L — AB (ref 135–145)
TOTAL PROTEIN: 7.2 g/dL (ref 6.5–8.1)

## 2015-07-22 LAB — GLUCOSE, CAPILLARY
Glucose-Capillary: 92 mg/dL (ref 65–99)
Glucose-Capillary: 104 mg/dL — ABNORMAL HIGH (ref 65–99)

## 2015-07-22 MED ORDER — POTASSIUM CHLORIDE 20 MEQ PO PACK
20.0000 meq | PACK | Freq: Three times a day (TID) | ORAL | Status: DC
  Administered 2015-07-22 – 2015-07-23 (×3): 20 meq via ORAL
  Filled 2015-07-22 (×3): qty 1

## 2015-07-22 MED ORDER — HALOPERIDOL LACTATE 5 MG/ML IJ SOLN: 2.0000 mg | Freq: Once | INTRAMUSCULAR | Status: DC

## 2015-07-22 MED ORDER — SODIUM CHLORIDE 1 G PO TABS
1.0000 g | ORAL_TABLET | Freq: Two times a day (BID) | ORAL | Status: DC
  Administered 2015-07-22 – 2015-07-26 (×8): 1 g via ORAL
  Filled 2015-07-22 (×9): qty 1

## 2015-07-22 NOTE — Plan of Care (Signed)
Problem: Education: Goal: Knowledge of [facility] Education information/materials will improve Outcome: Progressing Pt lives at home with husband    Past Medical History   Diagnosis  Date   .  Allergy     .  Cancer ([place])     .  Diabetes mellitus without complication ([place])     .  Arthritis     .  Asthma     .  Cataract     .  COPD (chronic obstructive pulmonary disease) ([place])     .  Depression         sometimes   .  Hyperlipidemia     .  Hypertension     .  Oxygen deficiency     .  Major depressive disorder, recurrent, in full remission with anxious distress ([place])  11/23/2014      Past Surgical History           Pt is well controlled with home medications.

## 2015-07-22 NOTE — Progress Notes (Signed)
Patient refusing to allow nursing staff to obtain intravenous access after patient removed other. Patient explained meaning and importance. Patient continues to refuse. MD notified. See orders. Madlyn Frankel, RN

## 2015-07-22 NOTE — Progress Notes (Signed)
[facility] [place] NAME: Jocelyn Burgess    MR#:  [id number]  DATE OF BIRTH:  [birth year]  SUBJECTIVE: 82 year old female patient admitted for hyponatremia. Patient has underlying COPD, diabetes, depression. Patient has severe constipation, she was seen in the emergency room 2 days ago. Admitted yesterday because of hyponatremia, hypokalemia. Sodium improved to 112-120. Patient denies any stomach pain.   CHIEF COMPLAINT:   Chief Complaint  Patient presents with  . Abdominal Pain  . Constipation  . Chest Pain  . Fall    REVIEW OF SYSTEMS:    Review of Systems  Constitutional: Negative for fever and chills.  HENT: Negative for hearing loss.   Eyes: Negative for blurred vision, double vision and photophobia.  Respiratory: Positive for cough. Negative for hemoptysis and shortness of breath.   Cardiovascular: Negative for palpitations, orthopnea and leg swelling.  Gastrointestinal: Positive for constipation. Negative for vomiting, abdominal pain and diarrhea.  Genitourinary: Negative for dysuria and urgency.  Musculoskeletal: Negative for myalgias and neck pain.  Skin: Negative for rash.  Neurological: Negative for dizziness, focal weakness, seizures, weakness and headaches.  Psychiatric/Behavioral: Negative for memory loss. The patient does not have insomnia.     Nutrition:  Tolerating Diet: Tolerating PT:      DRUG ALLERGIES:   Allergies  Allergen Reactions  . Ciprofloxacin Other (See Comments)    Reaction:  Unknown   . Inhaler Decongestant [Methamphetamine] Other (See Comments)    Reaction:  Unknown   . Molds & Smuts Other (See Comments)    Reaction:  Wheezing/itchy,watery eyes     VITALS:  Blood pressure 141/85, pulse 93, temperature 97.5 F (36.4 C), temperature source Oral, resp. rate 20, height 5\' 8"  (1.727 m), weight 78.427 kg (172 lb 14.4 oz), SpO2 98 %.  PHYSICAL EXAMINATION:   Physical Exam  GENERAL:   82 y.o.-year-old patient lying in the bed with no acute distress.  EYES: Pupils equal, round, reactive to light and accommodation. No scleral icterus. Extraocular muscles intact.  HEENT: Head atraumatic, normocephalic. Oropharynx and nasopharynx clear.  NECK:  Supple, no jugular venous distention. No thyroid enlargement, no tenderness.  LUNGS: Normal breath sounds bilaterally, no wheezing, rales,rhonchi or crepitation. No use of accessory muscles of respiration.  CARDIOVASCULAR: S1, S2 normal. No murmurs, rubs, or gallops.  ABDOMEN: Soft, nontender, nondistended. Bowel sounds present. No organomegaly or mass.  EXTREMITIES: No pedal edema, cyanosis, or clubbing.  NEUROLOGIC: Cranial nerves II through XII are intact. Muscle strength 5/5 in all extremities. Sensation intact. Gait not checked.  PSYCHIATRIC: The patient is alert and oriented x 3.  SKIN: No obvious rash, lesion, or ulcer.    LABORATORY PANEL:   CBC  Recent Labs Lab 07/22/15 0607  WBC 8.9  HGB 11.3*  HCT 33.3*  PLT 540*   ------------------------------------------------------------------------------------------------------------------  Chemistries   Recent Labs Lab 07/22/15 0607  NA 120*  K 3.1*  CL 82*  CO2 27  GLUCOSE 103*  BUN 7  CREATININE 0.36*  CALCIUM 9.2  AST 20  ALT 13*  ALKPHOS 114  BILITOT 0.5   ------------------------------------------------------------------------------------------------------------------  Cardiac Enzymes  Recent Labs Lab 07/21/15 1652  TROPONINI 0.03   ------------------------------------------------------------------------------------------------------------------  RADIOLOGY:  Ct Abdomen Pelvis W Contrast  07/21/2015  CLINICAL DATA:  Abdominal pain and chest pain, had enema for constipation 3 days ago, patient fell last night EXAM: CT ABDOMEN AND PELVIS WITH CONTRAST TECHNIQUE: Multidetector CT imaging of the abdomen and pelvis was performed using the  standard  protocol following bolus administration of intravenous contrast. CONTRAST:  138mL OMNIPAQUE IOHEXOL 300 MG/ML  SOLN COMPARISON:  07/17/2015, 05/01/2007 FINDINGS: Lower chest: 5 mm pulmonary nodule lateral right lung base stable from 2008 and therefore benign. Hepatobiliary: Negative Pancreas: Negative Spleen: Negative Adrenals/Urinary Tract: Mild fullness of both adrenal glands stable likely representing hyperplasia. Tiny low-attenuation bilateral renal lesions stable from 2017 study appearing most consistent with cysts. Bladder distended. Stomach/Bowel: Negative.  Mild to moderate fecal retention. Vascular/Lymphatic: Negative for acute findings. Heavy atherosclerotic calcification of the aortoiliac vessels noted. Reproductive: No acute findings.  Small calcified uterine fibroid. Other: No ascites. Musculoskeletal: No acute findings IMPRESSION: No significant or acute findings. Electronically Signed   By: Skipper Cliche M.D.   On: 07/21/2015 19:16     ASSESSMENT AND PLAN:   Principal Problem:   Acute hyponatremia Active Problems:   Constipation, slow transit   Abdominal pain   Hypokalemia   Acute on chronic hyponatremia and due to hypovolemic hyponatremia. Improving with IV hydration. #2 hypokalemia improved with supplementation. #3 severe constipation use stool softeners. CT of abdomen did not show any acute bowel obstruction. Continue Dulcolax, Colace, MiraLAX. #4 chronic respiratory failure: Due to COPD: No wheezing at this time. Continue oxygen, Spiriva, Singulair, albuterol. Patient is a heavy smoker advised to quit. Continue theophylline also. #5 essential hypertension: #6 hyperlipidemia continue pravastatin. #7.Diabetes mellitus II;contiue  Amaryl.tformin for 72 hours due to CT abdomen with contrast.     All the records are reviewed and case discussed with Care Management/Social Workerr. Management plans discussed with the patient, family and they are in agreement.  CODE STATUS:  full  TOTAL TIME TAKING CARE OF THIS PATIENT: 36 minutes.   POSSIBLE D/C IN 1-2 DAYS, DEPENDING ON CLINICAL CONDITION.   Epifanio Lesches M.D on 07/22/2015 at 9:58 AM  Between 7am to 6pm - Pager - [phone]  After 6pm go to [email or web address] - password [email or web address] [facility]  Office  [phone]  CC: Primary care physician; Bobetta Lime, MD

## 2015-07-23 ENCOUNTER — Inpatient Hospital Stay: Payer: Medicare Other

## 2015-07-23 ENCOUNTER — Encounter: Payer: Self-pay | Admitting: Radiology

## 2015-07-23 DIAGNOSIS — R109 Unspecified abdominal pain: Secondary | ICD-10-CM | POA: Insufficient documentation

## 2015-07-23 DIAGNOSIS — K5901 Slow transit constipation: Secondary | ICD-10-CM

## 2015-07-23 LAB — BASIC METABOLIC PANEL
ANION GAP: 13 (ref 5–15)
BUN: 7 mg/dL (ref 6–20)
CALCIUM: 9.2 mg/dL (ref 8.9–10.3)
CO2: 22 mmol/L (ref 22–32)
Chloride: 87 mmol/L — ABNORMAL LOW (ref 101–111)
Creatinine, Ser: 0.3 mg/dL — ABNORMAL LOW (ref 0.44–1.00)
GFR calc non Af Amer: >60 mL/min (ref >60)
Glucose, Bld: 45 mg/dL — ABNORMAL LOW (ref 65–99)
Potassium: 3 mmol/L — ABNORMAL LOW (ref 3.5–5.1)
Sodium: 122 mmol/L — ABNORMAL LOW (ref 135–145)

## 2015-07-23 LAB — OSMOLALITY: OSMOLALITY: 241 mosm/kg — AB (ref 275–295)

## 2015-07-23 LAB — MAGNESIUM: MAGNESIUM: 1.7 mg/dL (ref 1.7–2.4)

## 2015-07-23 LAB — TSH: TSH: 2.802 u[IU]/mL (ref 0.350–4.500)

## 2015-07-23 LAB — GLUCOSE, CAPILLARY
GLUCOSE-CAPILLARY: 46 mg/dL — AB (ref 65–99)
GLUCOSE-CAPILLARY: 46 mg/dL — AB (ref 65–99)
GLUCOSE-CAPILLARY: 110 mg/dL — AB (ref 65–99)
Glucose-Capillary: 118 mg/dL — ABNORMAL HIGH (ref 65–99)

## 2015-07-23 LAB — URIC ACID: URIC ACID, SERUM: 2.2 mg/dL — AB (ref 2.3–6.6)

## 2015-07-23 LAB — SODIUM, URINE, RANDOM: Sodium, Ur: 89 mmol/L

## 2015-07-23 MED ORDER — DEXTROSE 50 % IV SOLN
25.0000 mL | Freq: Once | INTRAVENOUS | Status: AC
  Administered 2015-07-23: 25 mL via INTRAVENOUS

## 2015-07-23 MED ORDER — FLEET ENEMA 7-19 GM/118ML RE ENEM: 1.0000 | ENEMA | Freq: Once | RECTAL | Status: DC

## 2015-07-23 MED ORDER — ENOXAPARIN SODIUM 40 MG/0.4ML ~~LOC~~ SOLN
40.0000 mg | SUBCUTANEOUS | Status: DC
  Administered 2015-07-23 – 2015-07-25 (×3): 40 mg via SUBCUTANEOUS
  Filled 2015-07-23 (×3): qty 0.4

## 2015-07-23 MED ORDER — POTASSIUM CHLORIDE 20 MEQ PO PACK
20.0000 meq | PACK | Freq: Four times a day (QID) | ORAL | Status: DC
  Administered 2015-07-23 (×3): 20 meq via ORAL
  Filled 2015-07-23 (×3): qty 1

## 2015-07-23 MED ORDER — INSULIN ASPART 100 UNIT/ML ~~LOC~~ SOLN
0.0000 [IU] | Freq: Three times a day (TID) | SUBCUTANEOUS | Status: DC
  Administered 2015-07-24: 12:00:00 2 [IU] via SUBCUTANEOUS
  Administered 2015-07-24: 17:00:00 1 [IU] via SUBCUTANEOUS
  Administered 2015-07-25: 17:00:00 3 [IU] via SUBCUTANEOUS
  Administered 2015-07-25: 5 [IU] via SUBCUTANEOUS
  Administered 2015-07-25: 3 [IU] via SUBCUTANEOUS
  Administered 2015-07-26: 2 [IU] via SUBCUTANEOUS
  Administered 2015-07-26: 3 [IU] via SUBCUTANEOUS
  Filled 2015-07-23: qty 3
  Filled 2015-07-23: qty 5
  Filled 2015-07-23: qty 1
  Filled 2015-07-23: qty 2
  Filled 2015-07-23: qty 3
  Filled 2015-07-23: qty 2
  Filled 2015-07-23: qty 3

## 2015-07-23 MED ORDER — FLEET ENEMA 7-19 GM/118ML RE ENEM
1.0000 | ENEMA | Freq: Once | RECTAL | Status: AC
  Administered 2015-07-23: 1 via RECTAL

## 2015-07-23 MED ORDER — DEXTROSE 50 % IV SOLN
INTRAVENOUS | Status: AC
  Administered 2015-07-23: 25 mL via INTRAVENOUS
  Filled 2015-07-23: qty 50

## 2015-07-23 MED ORDER — NICOTINE 14 MG/24HR TD PT24
14.0000 mg | MEDICATED_PATCH | Freq: Every day | TRANSDERMAL | Status: DC
  Administered 2015-07-23 – 2015-07-26 (×4): 14 mg via TRANSDERMAL
  Filled 2015-07-23 (×4): qty 1

## 2015-07-23 MED ORDER — ACETAMINOPHEN 325 MG PO TABS: 650.0000 mg | ORAL_TABLET | Freq: Four times a day (QID) | ORAL | Status: DC | PRN

## 2015-07-23 MED ORDER — SODIUM CHLORIDE 0.9 % IV SOLN
INTRAVENOUS | Status: DC
  Administered 2015-07-23 (×2): via INTRAVENOUS

## 2015-07-23 MED ORDER — POTASSIUM CHLORIDE 20 MEQ PO PACK: 40.0000 meq | PACK | ORAL | Status: DC

## 2015-07-23 MED ORDER — MAGNESIUM CITRATE PO SOLN
1.0000 | Freq: Once | ORAL | Status: AC
  Administered 2015-07-23: 17:00:00 1 via ORAL
  Filled 2015-07-23: qty 296

## 2015-07-23 MED ORDER — IOHEXOL 300 MG/ML  SOLN
75.0000 mL | Freq: Once | INTRAMUSCULAR | Status: AC | PRN
  Administered 2015-07-23: 75 mL via INTRAVENOUS

## 2015-07-23 NOTE — Consult Note (Signed)
[facility] CONSULT NOTE    Date: 07/23/2015                  Patient Name:  Jocelyn Burgess  MRN: [id number]  DOB: 1931/10/08  Age / Sex: 82 y.o., female         PCP: Bobetta Lime, MD                 Service Requesting Consult: Dr. Vianne Bulls                 Reason for Consult: hyponatremia            History of Present Illness: Patient is a 82 y.o. female with a PMHx of diabetes mellitus, osteoarthritis, asthma,COPD, depression, hyperlipidemia, hypertension, who was admitted to [facility] on 07/21/2015 for evaluation of constipation. Patient states that her symptoms began last week.  Last Tuesday she noted that she hadn't had a bowel movement in 4-5 days.  She subsequently had an emergency room visit and was administered an enema and did have some relief.  However her constipation then came back. She came back to the hospital on 07/21/15 and she was noted as havingsignificant metabolic derangements in the form of low-sodium at 112 and potassium of 2.9.  Patient is noted to be on furosemide at home which she was still taking while she had rather poor by mouth intake given her constipation.   She had a CT scan of the abdomen and pelvis which demonstratedmild to moderate fecal retention.  Sodium is currently up to 122.  She is currently on 0.9 normal saline at 50 cc per hour.   Medications: Outpatient medications: Prescriptions prior to admission  Medication Sig Dispense Refill Last Dose  . albuterol (PROVENTIL HFA;VENTOLIN HFA) 108 (90 Base) MCG/ACT inhaler Inhale 2 puffs into the lungs every 4 (four) hours as needed for wheezing or shortness of breath.    07/21/2015 at Unknown time  . aspirin EC 81 MG tablet Take 81 mg by mouth at bedtime.   07/20/2015 at Unknown time  . citalopram (CELEXA) 20 MG tablet Take 20 mg by mouth at bedtime.    07/20/2015 at Unknown time  . cloNIDine (CATAPRES) 0.1 MG tablet Take 0.1 mg by mouth 2 (two) times daily.   07/21/2015 at 0800  .  cyclobenzaprine (FLEXERIL) 5 MG tablet Take 1 tablet (5 mg total) by mouth 3 (three) times daily as needed for muscle spasms. 30 tablet 1 unknown  . diazepam (VALIUM) 5 MG tablet Take 1 tablet (5 mg total) by mouth every 8 (eight) hours as needed for anxiety. 270 tablet 1 unknown  . furosemide (LASIX) 20 MG tablet Take 20 mg by mouth every morning.    07/21/2015 at Unknown time  . gabapentin (NEURONTIN) 300 MG capsule Take 300-600 mg by mouth See admin instructions. Take 1 capsule by mouth in the morning and take 2 capsules (600mg ) by mouth every night at bedtime.   07/21/2015 at Unknown time  . gemfibrozil (LOPID) 600 MG tablet Take 600 mg by mouth 2 (two) times daily before a meal.   07/21/2015 at Unknown time  . glimepiride (AMARYL) 4 MG tablet Take 4 mg by mouth 2 (two) times daily.    07/21/2015 at Unknown time  . hydrochlorothiazide (HYDRODIURIL) 25 MG tablet Take 25 mg by mouth daily.   unknown  . HYDROcodone-acetaminophen (NORCO) 5-325 MG tablet Take 1 tablet by mouth every 12 (twelve) hours as needed for moderate pain. 60 tablet  0 unknown  . loratadine (CLARITIN) 10 MG tablet Take 10 mg by mouth daily.    unknown  . lovastatin (MEVACOR) 20 MG tablet Take 20 mg by mouth at bedtime.   07/20/2015 at Unknown time  . magnesium hydroxide (MILK OF MAGNESIA) 400 MG/5ML suspension Take 30 mLs by mouth daily as needed for mild constipation.    unknown  . meclizine (ANTIVERT) 25 MG tablet Take 25 mg by mouth every 8 (eight) hours as needed for dizziness.   unknown  . meloxicam (MOBIC) 15 MG tablet Take 1 tablet (15 mg total) by mouth daily. 90 tablet 2 unknown at Unknown time  . metFORMIN (GLUCOPHAGE) 1000 MG tablet Take 1 tablet (1,000 mg total) by mouth 2 (two) times daily with a meal. 180 tablet 1 07/21/2015 at Unknown time  . mometasone (ELOCON) 0.1 % lotion Apply 1 application topically as needed (for dry,itchy skin). Pt applies to both ears as needed.   unknown  . montelukast (SINGULAIR) 10 MG tablet Take 10  mg by mouth at bedtime.   07/20/2015 at Unknown time  . nystatin (MYCOSTATIN) 100000 UNIT/ML suspension Take 5 mLs (500,000 Units total) by mouth 4 (four) times daily. Swish retain in mouth long as possible then swallow 120 mL 0 unknown  . potassium chloride (K-DUR) 10 MEQ tablet Take 1 tablet (10 mEq total) by mouth 2 (two) times daily. 60 tablet 1 07/21/2015 at Unknown time  . SPIRIVA HANDIHALER 18 MCG inhalation capsule INHALE CONTENTS OF 1 CAPSULE ONCE DAILY 90 capsule 1 07/20/2015 at Unknown time  . theophylline (UNIPHYL) 400 MG 24 hr tablet Take 400 mg by mouth daily.    unknown    Current medications: Current Facility-Administered Medications  Medication Dose Route Frequency Provider Last Rate Last Dose  . 0.9 %  sodium chloride infusion   Intravenous Continuous Epifanio Lesches, MD 50 mL/hr at 07/23/15 1224    . acetaminophen (TYLENOL) tablet 650 mg  650 mg Oral Q6H PRN Epifanio Lesches, MD      . albuterol (PROVENTIL) (2.5 MG/3ML) 0.083% nebulizer solution 2.5 mg  2.5 mg Nebulization Q4H PRN Lenis Noon, RPH      . aspirin EC tablet 81 mg  81 mg Oral QHS Idelle Crouch, MD   81 mg at 07/22/15 2115  . citalopram (CELEXA) tablet 20 mg  20 mg Oral QHS Idelle Crouch, MD   20 mg at 07/22/15 2115  . cloNIDine (CATAPRES) tablet 0.1 mg  0.1 mg Oral BID Idelle Crouch, MD   0.1 mg at 07/23/15 0827  . cyclobenzaprine (FLEXERIL) tablet 5 mg  5 mg Oral TID PRN Idelle Crouch, MD      . diazepam (VALIUM) tablet 5 mg  5 mg Oral Q8H PRN Idelle Crouch, MD   5 mg at 07/23/15 [id number]  . docusate sodium (COLACE) capsule 100 mg  100 mg Oral BID Idelle Crouch, MD   100 mg at 07/23/15 [id number]  . enoxaparin (LOVENOX) injection 40 mg  40 mg Subcutaneous Q24H Epifanio Lesches, MD      . gabapentin (NEURONTIN) capsule 300 mg  300 mg Oral Q0600 Lenis Noon, RPH   300 mg at 07/23/15 [id number]  . gabapentin (NEURONTIN) capsule 600 mg  600 mg Oral QHS Lenis Noon, RPH   600 mg at 07/22/15 2115  .  gemfibrozil (LOPID) tablet 600 mg  600 mg Oral BID AC Idelle Crouch, MD   600 mg at 07/23/15 [id number]  .  haloperidol lactate (HALDOL) injection 2 mg  2 mg Intramuscular Once Epifanio Lesches, MD   2 mg at 07/22/15 1537  . hydrochlorothiazide (HYDRODIURIL) tablet 25 mg  25 mg Oral Daily Idelle Crouch, MD   25 mg at 07/23/15 0826  . HYDROcodone-acetaminophen (NORCO/VICODIN) 5-325 MG per tablet 1 tablet  1 tablet Oral Q12H PRN Idelle Crouch, MD   1 tablet at 07/23/15 [phone]  . insulin aspart (novoLOG) injection 0-9 Units  0-9 Units Subcutaneous TID WC Epifanio Lesches, MD      . loratadine (CLARITIN) tablet 10 mg  10 mg Oral Daily Idelle Crouch, MD   10 mg at 07/23/15 0826  . magnesium citrate solution 1 Bottle  1 Bottle Oral Once PRN Idelle Crouch, MD      . magnesium hydroxide (MILK OF MAGNESIA) suspension 30 mL  30 mL Oral Daily PRN Idelle Crouch, MD      . meclizine (ANTIVERT) tablet 25 mg  25 mg Oral Q8H PRN Idelle Crouch, MD      . meloxicam [facility]) tablet 15 mg  15 mg Oral Daily Idelle Crouch, MD   15 mg at 07/23/15 [id number]  . montelukast (SINGULAIR) tablet 10 mg  10 mg Oral QHS Idelle Crouch, MD   10 mg at 07/22/15 2115  . nicotine (NICODERM CQ - dosed in mg/24 hours) patch 14 mg  14 mg Transdermal Daily Epifanio Lesches, MD   14 mg at 07/23/15 1516  . nystatin (MYCOSTATIN) 100000 UNIT/ML suspension 500,000 Units  5 mL Oral QID Idelle Crouch, MD   500,000 Units at 07/23/15 1224  . ondansetron (ZOFRAN) tablet 4 mg  4 mg Oral Q6H PRN Idelle Crouch, MD      . polyethylene glycol (MIRALAX / GLYCOLAX) packet 17 g  17 g Oral BID Idelle Crouch, MD   17 g at 07/23/15 0827  . potassium chloride (KLOR-CON) packet 20 mEq  20 mEq Oral QID Epifanio Lesches, MD   20 mEq at 07/23/15 1519  . pravastatin (PRAVACHOL) tablet 20 mg  20 mg Oral q1800 Idelle Crouch, MD   20 mg at 07/22/15 1736  . sodium chloride tablet 1 g  1 g Oral BID Epifanio Lesches, MD   1 g at  07/23/15 0826  . theophylline (UNIPHYL) 400 MG 24 hr tablet 400 mg  400 mg Oral Daily Idelle Crouch, MD   400 mg at 07/23/15 0834  . tiotropium (SPIRIVA) inhalation capsule 18 mcg  18 mcg Inhalation Daily Idelle Crouch, MD   18 mcg at 07/23/15 0825      Allergies: Allergies  Allergen Reactions  . Ciprofloxacin Other (See Comments)    Reaction:  Unknown   . Inhaler Decongestant [Methamphetamine] Other (See Comments)    Reaction:  Unknown   . Molds & Smuts Other (See Comments)    Reaction:  Wheezing/itchy,watery eyes       Past Medical History: Past Medical History  Diagnosis Date  . Allergy   . Cancer ([place])   . Diabetes mellitus without complication ([place])   . Arthritis   . Asthma   . Cataract   . COPD (chronic obstructive pulmonary disease) ([place])   . Depression     sometimes  . Hyperlipidemia   . Hypertension   . Oxygen deficiency   . Major depressive disorder, recurrent, in full remission with anxious distress ([place]) 11/23/2014     Past Surgical History: Past Surgical History  Procedure  Laterality Date  . Breast surgery Left      Family History: Family History  Problem Relation Age of Onset  . Arthritis Mother   . Arthritis Father   . Cancer Sister     brain  . Heart disease Sister   . Stroke Sister   . Diabetes Brother      Social History: Social History   Social History  . Marital Status: Married    Spouse Name: N/A  . Number of Children: N/A  . Years of Education: N/A   Occupational History  . Not on file.   Social History Main Topics  . Smoking status: Current Every Day Smoker -- 1.00 packs/day for 45 years    Types: Cigarettes  . Smokeless tobacco: Current User  . Alcohol Use: No  . Drug Use: No  . Sexual Activity: No   Other Topics Concern  . Not on file   Social History Narrative     Review of Systems: Review of Systems  Constitutional: Positive for malaise/fatigue. Negative for fever, chills and weight loss.  HENT:  Negative for hearing loss and nosebleeds.   Eyes: Negative for blurred vision, double vision and photophobia.  Respiratory: Negative for cough, hemoptysis and sputum production.   Cardiovascular: Positive for chest pain. Negative for palpitations and orthopnea.  Gastrointestinal: Positive for abdominal pain and constipation. Negative for heartburn, nausea and vomiting.  Genitourinary: Negative for dysuria, urgency and frequency.  Musculoskeletal: Negative for myalgias and neck pain.  Skin: Negative for rash.  Neurological: Negative for dizziness, focal weakness and headaches.  Endo/Heme/Allergies: Negative for polydipsia. Does not bruise/bleed easily.  Psychiatric/Behavioral: Positive for depression. The patient is not nervous/anxious.      Vital Signs: Blood pressure 155/67, pulse 89, temperature 97.9 F (36.6 C), temperature source Oral, resp. rate 19, height 5\' 8"  (1.727 m), weight 78.427 kg (172 lb 14.4 oz), SpO2 98 %.  Weight trends: Filed Weights   07/21/15 1647 07/21/15 2147 07/22/15 0500  Weight: 83.915 kg (185 lb) 78.427 kg (172 lb 14.4 oz) 78.427 kg (172 lb 14.4 oz)    Physical Exam: General: NAD,   Head: Normocephalic, atraumatic.  Eyes: Anicteric, EOMI  Nose: Mucous membranes moist, not inflammed, nonerythematous.  Throat: Oropharynx nonerythematous, no exudate appreciated.   Neck: Supple, trachea midline.  Lungs:  Normal respiratory effort. Clear to auscultation BL without crackles or wheezes.  Heart: RRR. S1 and S2 normal without gallop, murmur, or rubs.  Abdomen:  BS normoactive. Soft, Nondistended, non-tender.  No masses or organomegaly.  Extremities: No pretibial edema.  Neurologic: A&O X3, Motor strength is 5/5 in the all 4 extremities  Skin: No visible rashes, scars.    Lab results: Basic Metabolic Panel:  Recent Labs Lab 07/21/15 1652 07/22/15 0607 07/23/15 0455  NA 112* 120* 122*  K 2.9* 3.1* 3.0*  CL 72* 82* 87*  CO2 26 27 22   GLUCOSE 161* 103*  45*  BUN 9 7 7   CREATININE 0.40* 0.36* 0.30*  CALCIUM 9.7 9.2 9.2  MG  --   --  1.7    Liver Function Tests:  Recent Labs Lab 07/17/15 1545 07/21/15 1652 07/22/15 0607  AST 26 22 20   ALT 15 16 13*  ALKPHOS 101 126 114  BILITOT 0.4 1.0 0.5  PROT 8.0 7.7 7.2  ALBUMIN 4.2 4.0 3.9    Recent Labs Lab 07/17/15 1545  LIPASE 24   No results for input(s): AMMONIA in the last 168 hours.  CBC:  Recent Labs Lab  07/17/15 1545 07/19/15 1339 07/21/15 1652 07/22/15 0607  WBC 9.0 10.9 9.3 8.9  NEUTROABS 6.8* 7.8*  --   --   HGB 11.5* 11.3* 11.6* 11.3*  HCT 34.6* 34.2* 35.0 33.3*  MCV 78.2* 78.1* 77.6* 76.3*  PLT 498* 495* 524* 540*    Cardiac Enzymes:  Recent Labs Lab 07/17/15 1545 07/21/15 1652  TROPONINI <0.03 0.03    BNP: Invalid input(s): POCBNP  CBG:  Recent Labs Lab 07/22/15 0738 07/22/15 1153  GLUCAP 92 104*    Microbiology: Results for orders placed or performed in visit on 04/17/15  Urine Culture     Status: None   Collection Time: 04/17/15 12:00 AM  Result Value Ref Range Status   Urine Culture, Routine Final report  Final   Urine Culture result 1 Comment  Final    Comment: Mixed urogenital flora Less than 10,000 colonies/mL     Coagulation Studies: No results for input(s): LABPROT, INR in the last 72 hours.  Urinalysis: No results for input(s): COLORURINE, LABSPEC, PHURINE, GLUCOSEU, HGBUR, BILIRUBINUR, KETONESUR, PROTEINUR, UROBILINOGEN, NITRITE, LEUKOCYTESUR in the last 72 hours.  Invalid input(s): APPERANCEUR    Imaging: Ct Chest W Contrast  07/23/2015  CLINICAL DATA:  Right-sided chest pain, shortness of breath EXAM: CT CHEST WITH CONTRAST TECHNIQUE: Multidetector CT imaging of the chest was performed during intravenous contrast administration. CONTRAST:  38mL OMNIPAQUE IOHEXOL 300 MG/ML  SOLN COMPARISON:  Chest radiograph dated 07/19/2015 and 07/17/2015. Partial comparison to CT abdomen pelvis dated 07/21/2015 and 05/01/2007.  FINDINGS: Mediastinum/Nodes: The heart is normal in size. No pericardial effusion. Three vessel coronary atherosclerosis. Atherosclerotic calcifications of the aortic arch. Small mediastinal lymph nodes, including a 9 mm short axis right paratracheal node (series 2/ image 23), within normal limits. Visualized thyroid is notable for a 13 mm left thyroid nodule (series 2/ image 6). Lungs/Pleura: 5 mm nodule at the lateral right lung base (series 3/ image 55), unchanged since 2008, benign. Underlying moderate centrilobular and paraseptal emphysematous changes. Mild dependent atelectasis in the posterior right upper lobe. Mild linear scarring/ atelectasis in the lingula. Mild patchy opacities at the lung bases, likely atelectasis. No focal consolidation. No pleural effusion or pneumothorax. Upper abdomen: Visualized upper abdomen is notable for layering gallbladder sludge and/or noncalcified gallstones (series 2/ image 62) and vascular calcifications. Musculoskeletal: Severe compression fracture deformity at T6, with approximately 60% loss of height, as noted on recent chest radiograph, new from 2016. No retropulsion. Mild degenerative changes of the visualized thoracolumbar spine. Healing right lateral 6th rib fracture deformity (series 2/ image 27). IMPRESSION: 5 mm nodule at the lateral right lung base, unchanged since 2008, benign. Moderate emphysematous changes. Severe compression fracture deformity at T6, with approximately 60% loss of height, as noted on recent chest radiograph, new from 2016. No retropulsion. Healing right lateral 6th rib fracture deformity. Electronically Signed   By: Julian Hy M.D.   On: 07/23/2015 12:07   Ct Abdomen Pelvis W Contrast  07/21/2015  CLINICAL DATA:  Abdominal pain and chest pain, had enema for constipation 3 days ago, patient fell last night EXAM: CT ABDOMEN AND PELVIS WITH CONTRAST TECHNIQUE: Multidetector CT imaging of the abdomen and pelvis was performed using the  standard protocol following bolus administration of intravenous contrast. CONTRAST:  13mL OMNIPAQUE IOHEXOL 300 MG/ML  SOLN COMPARISON:  07/17/2015, 05/01/2007 FINDINGS: Lower chest: 5 mm pulmonary nodule lateral right lung base stable from 2008 and therefore benign. Hepatobiliary: Negative Pancreas: Negative Spleen: Negative Adrenals/Urinary Tract: Mild fullness of both adrenal  glands stable likely representing hyperplasia. Tiny low-attenuation bilateral renal lesions stable from 2017 study appearing most consistent with cysts. Bladder distended. Stomach/Bowel: Negative.  Mild to moderate fecal retention. Vascular/Lymphatic: Negative for acute findings. Heavy atherosclerotic calcification of the aortoiliac vessels noted. Reproductive: No acute findings.  Small calcified uterine fibroid. Other: No ascites. Musculoskeletal: No acute findings IMPRESSION: No significant or acute findings. Electronically Signed   By: Skipper Cliche M.D.   On: 07/21/2015 19:16      Assessment & Plan: Pt is a 82 y.o. female with a PMHx of diabetes mellitus, osteoarthritis, asthma,COPD, depression, hyperlipidemia, hypertension, who was admitted to [facility] on 07/21/2015 for evaluation of constipation.  1.  Hyponatremia in the setting of constipation, poor po intake, ? Excessive water ingesttion to relieve constipation.  Patient also was taking lasix.  2.  Constipation. 3.  Anemia unspecified.    Plan:  We're asked to see the patient for evaluation management of hyponatremia.  She's had recent constipation which may have been contributing.  She was also drinking significant amounts of water to help relieve the constipation.  However she was also taking Lasix.  We will proceed with additional workup.  We will check serum osmolality, urine osmolality, TSH, cortisol, uric acid.  Since sodium has risen with 0.9 normal saline we plan to continue this.  We will also check SPEP and UPEP.  Further plan based upon testing and patient's  progress.

## 2015-07-23 NOTE — Care Management (Signed)
Admitted to [facility] with the diagnosis of acute hyponatremia. Lives with husband, [name], [phone]). Last seen Dr. Phoebe Sharps a couple of weeks ago. No home Health. No skilled facility. Home oxygen at night only thru [place] x 10 years. Incontinent of bladder, not bowel. Uses a cane and standard walker in the home. Last fall was Saturday a week ago. Good appetite until last week. Husband helps with Ms. Persons's basic needs (bathing and dressing). Husband is in the home 24/7. Last BM on Thursday. Will request physical therapy order from Dr. Dian Queen. Shelbie Ammons RN MSN CCM Care Management [phone]

## 2015-07-23 NOTE — Progress Notes (Signed)
[employer] at [place] NAME: Jocelyn Burgess    MR#:  [id number]  DATE OF BIRTH:  [birth year]  SUBJECTIVE: 82 year old female patient admitted for hyponatremia. Patient has underlying COPD, diabetes, depression. Patient has severe constipation, she was seen in the emergency room 2 days ago. Admitted because of hyponatremia, hypokalemia. Sodium improved to 112-120. A shunt lost IV access, refused IV access. According to patient's husband patient is going downhill for the past 1 week, confused more this morning. Sodium continues to be low at 122. Patient is confused.   CHIEF COMPLAINT:   Chief Complaint  Patient presents with  . Abdominal Pain  . Constipation  . Chest Pain  . Fall    REVIEW OF SYSTEMS:    Review of Systems  Constitutional: Negative for fever and chills.  HENT: Negative for hearing loss.   Eyes: Negative for blurred vision, double vision and photophobia.  Respiratory: Positive for cough. Negative for hemoptysis and shortness of breath.   Cardiovascular: Negative for palpitations, orthopnea and leg swelling.  Gastrointestinal: Positive for constipation. Negative for vomiting, abdominal pain and diarrhea.  Genitourinary: Negative for dysuria and urgency.  Musculoskeletal: Negative for myalgias and neck pain.  Skin: Negative for rash.  Neurological: Negative for dizziness, focal weakness, seizures, weakness and headaches.  Psychiatric/Behavioral: Negative for memory loss. The patient does not have insomnia.     Nutrition:  Tolerating Diet: Tolerating PT:      DRUG ALLERGIES:   Allergies  Allergen Reactions  . Ciprofloxacin Other (See Comments)    Reaction:  Unknown   . Inhaler Decongestant [Methamphetamine] Other (See Comments)    Reaction:  Unknown   . Molds & Smuts Other (See Comments)    Reaction:  Wheezing/itchy,watery eyes     VITALS:  Blood pressure 140/82, pulse 93, temperature 98.4 F (36.9 C),  temperature source Oral, resp. rate 20, height 5\' 8"  (1.727 m), weight 78.427 kg (172 lb 14.4 oz), SpO2 98 %.  PHYSICAL EXAMINATION:   Physical Exam  GENERAL:  82 y.o.-year-old patient lying in the bed with no acute distress.  EYES: Pupils equal, round, reactive to light and accommodation. No scleral icterus. Extraocular muscles intact.  HEENT: Head atraumatic, normocephalic. Oropharynx and nasopharynx clear.  NECK:  Supple, no jugular venous distention. No thyroid enlargement, no tenderness.  LUNGS: Normal breath sounds bilaterally, no wheezing, rales,rhonchi or crepitation. No use of accessory muscles of respiration.  CARDIOVASCULAR: S1, S2 normal. No murmurs, rubs, or gallops.  ABDOMEN: Soft, nontender, nondistended. Bowel sounds present. No organomegaly or mass.  EXTREMITIES: No pedal edema, cyanosis, or clubbing.  NEUROLOGIC: Cranial nerves II through XII are intact. Muscle strength 5/5 in all extremities. Sensation intact. Gait not checked.  PSYCHIATRIC: Confused. SKIN: No obvious rash, lesion, or ulcer.    LABORATORY PANEL:   CBC  Recent Labs Lab 07/22/15 0607  WBC 8.9  HGB 11.3*  HCT 33.3*  PLT 540*   ------------------------------------------------------------------------------------------------------------------  Chemistries   Recent Labs Lab 07/22/15 0607 07/23/15 0455  NA 120* 122*  K 3.1* 3.0*  CL 82* 87*  CO2 27 22  GLUCOSE 103* 45*  BUN 7 7  CREATININE 0.36* 0.30*  CALCIUM 9.2 9.2  AST 20  --   ALT 13*  --   ALKPHOS 114  --   BILITOT 0.5  --    ------------------------------------------------------------------------------------------------------------------  Cardiac Enzymes  Recent Labs Lab 07/21/15 1652  TROPONINI 0.03   ------------------------------------------------------------------------------------------------------------------  RADIOLOGY:  Ct Abdomen Pelvis W  Contrast  07/21/2015  CLINICAL DATA:  Abdominal pain and chest pain, had  enema for constipation 3 days ago, patient fell last night EXAM: CT ABDOMEN AND PELVIS WITH CONTRAST TECHNIQUE: Multidetector CT imaging of the abdomen and pelvis was performed using the standard protocol following bolus administration of intravenous contrast. CONTRAST:  184mL OMNIPAQUE IOHEXOL 300 MG/ML  SOLN COMPARISON:  07/17/2015, 05/01/2007 FINDINGS: Lower chest: 5 mm pulmonary nodule lateral right lung base stable from 2008 and therefore benign. Hepatobiliary: Negative Pancreas: Negative Spleen: Negative Adrenals/Urinary Tract: Mild fullness of both adrenal glands stable likely representing hyperplasia. Tiny low-attenuation bilateral renal lesions stable from 2017 study appearing most consistent with cysts. Bladder distended. Stomach/Bowel: Negative.  Mild to moderate fecal retention. Vascular/Lymphatic: Negative for acute findings. Heavy atherosclerotic calcification of the aortoiliac vessels noted. Reproductive: No acute findings.  Small calcified uterine fibroid. Other: No ascites. Musculoskeletal: No acute findings IMPRESSION: No significant or acute findings. Electronically Signed   By: Skipper Cliche M.D.   On: 07/21/2015 19:16     ASSESSMENT AND PLAN:   Principal Problem:   Acute hyponatremia Active Problems:   Constipation, slow transit   Abdominal pain   Hypokalemia   Acute on chronic hyponatremia and due to hypovolemic hyponatremia. Improving with IV hydration. But patient refused IV access., Reinserted IV axis and start IV hydration. Obtain nephrology consult because of recurrent hyponatremia, obtain CT of the chest to evaluate for lung cancer possibly causing SIADH. Check serum osmolality, urine osmolality, since urine sodium. #2 hypokalemia; still low continue IV potassium supplementation. #3 severe constipation use stool softeners. CT of abdomen did not show any acute bowel obstruction. Continue Dulcolax, Colace, MiraLAX. Explained to the husband that abdominal CAT scan did not  show any mass, she has constipation. Use stool softeners. #4 chronic respiratory failure: Due to COPD: No wheezing at this time. Continue oxygen, Spiriva, Singulair, albuterol. Patient is a heavy smoker advised to quit. Continue theophylline also. #5 essential hypertension: #6 hyperlipidemia continue pravastatin. #7.Diabetes mellitus II;contiue  Amaryl.tformin for 72 hours due to CT abdomen with contrast. #8 altered mental status: Possibly metabolic encephalopathy from hyponatremia hypokalemia. According to husband she is going downhill may be starting to have early dementia also. #9 physical therapy consult once  the sodium improves. Discussed the plan with patient's husband. More than 50% of time spent in counseling and coordination of the care.    All the records are reviewed and case discussed with Care Management/Social Workerr. Management plans discussed with the patient, family and they are in agreement.  CODE STATUS: full  TOTAL TIME TAKING CARE OF THIS PATIENT: 36 minutes.   POSSIBLE D/C IN 1-2 DAYS, DEPENDING ON CLINICAL CONDITION.   Epifanio Lesches M.D on 07/23/2015 at 7:50 AM  Between 7am to 6pm - Pager - [phone]  After 6pm go to [email or web address] - password [email or web address] [place] [facility]  Office  [phone]  CC: Primary care physician; Bobetta Lime, MD

## 2015-07-23 NOTE — Progress Notes (Addendum)
MEDICATION RELATED CONSULT NOTE - INITIAL   Pharmacy Consult for Electrolyte monitoring (K replacement) Indication: hypokalemia  Allergies  Allergen Reactions  . Ciprofloxacin Other (See Comments)    Reaction:  Unknown   . Inhaler Decongestant [Methamphetamine] Other (See Comments)    Reaction:  Unknown   . Molds & Smuts Other (See Comments)    Reaction:  Wheezing/itchy,watery eyes     Patient Measurements: Height: 5\' 8"  (172.7 cm) Weight: 172 lb 14.4 oz (78.427 kg) IBW/kg (Calculated) : 63.9   Vital Signs: Temp: 98.4 F (36.9 C) (02/06 0533) Temp Source: Oral (02/06 0533) BP: 148/84 mmHg (02/06 0824) Pulse Rate: 92 (02/06 0824) Intake/Output from previous day: 02/05 0701 - 02/06 0700 In: 958.3 [P.O.:480; I.V.:478.3] Out: 500 [Urine:500] Intake/Output from this shift: Total I/O In: 240 [P.O.:240] Out: 600 [Urine:600]  Labs:  Recent Labs  07/21/15 1652 07/22/15 0607 07/23/15 0455  WBC 9.3 8.9  --   HGB 11.6* 11.3*  --   HCT 35.0 33.3*  --   PLT 524* 540*  --   CREATININE 0.40* 0.36* 0.30*  ALBUMIN 4.0 3.9  --   PROT 7.7 7.2  --   AST 22 20  --   ALT 16 13*  --   ALKPHOS 126 114  --   BILITOT 1.0 0.5  --    Estimated Creatinine Clearance: 60.7 mL/min (by C-G formula based on Cr of 0.3).   Microbiology: No results found for this or any previous visit (from the past 720 hour(s)).   Assessment: 82 yo female with hypokalemia this admission despite supplementation. MD requesting supplementation to be PO only (IV issues).   2/5 K 3.1 - total 50 mEq PO KCl 2/6 K 3.0 - current orders for KCl 20 TID  Goal of Therapy:  K 3.5-5.0 Mg WNL  Plan:  Will increase frequency to KCl 20 mEq PO 4x/Day after discussion with MD BMET in AM Add-on Mag level   Rocky Morel 07/23/2015,12:13 PM  Mg 1.7 - WNL  Rayna Sexton, PharmD, BCPS Clinical Pharmacist 07/23/2015 3:26 PM

## 2015-07-23 NOTE — Progress Notes (Signed)
PT Cancellation Note  Patient Details Name: Jocelyn Burgess MRN: [id number] DOB: [birth year]   Cancelled Treatment:    Reason Eval/Treat Not Completed: Other (comment) (PT about to enter pt's room but MD present requesting to see pt prior to PT session.)  Will re-attempt PT eval at a later date/time.   Leitha Bleak 07/23/2015, 3:41 PM Leitha Bleak, [place]

## 2015-07-23 NOTE — Consult Note (Signed)
[facility]  [address] [place], [place] [address] Phone: [phone] Fax : [phone]  Consultation  Referring Provider:     No ref. provider found Primary Care Physician:  Bobetta Lime, MD Primary Gastroenterologist:  None         Reason for Consultation:     Constipation  Date of Admission:  07/21/2015 Date of Consultation:  07/23/2015         HPI:   Jocelyn Burgess is a 82 y.o. female who was admitted with constipation. The patient had a CT scan showing the patient to have a large amount of stool in the colon. The patient is not very verbal but the history is gotten from the husband. The patient's husband states that the patient was in the ER twice and was treated with MiraLAX. The patient taken for 2 days and came back to the ER with continued abdominal pain. The patient has never had a colonoscopy as reported by the husband. Most of the medications for helping her to move her bowels have been ordered when necessary and the patient's husband states that she has not had any bowel movements since admission. The patient was treated in the ER with an enema at one of the visits and had some results.  Past Medical History  Diagnosis Date  . Allergy   . Cancer ([place])   . Diabetes mellitus without complication ([place])   . Arthritis   . Asthma   . Cataract   . COPD (chronic obstructive pulmonary disease) ([place])   . Depression     sometimes  . Hyperlipidemia   . Hypertension   . Oxygen deficiency   . Major depressive disorder, recurrent, in full remission with anxious distress ([place]) 11/23/2014    Past Surgical History  Procedure Laterality Date  . Breast surgery Left     Prior to Admission medications   Medication Sig Start Date End Date Taking? Authorizing Provider  albuterol (PROVENTIL HFA;VENTOLIN HFA) 108 (90 Base) MCG/ACT inhaler Inhale 2 puffs into the lungs every 4 (four) hours as needed for wheezing or shortness of breath.    Yes Historical Provider,  MD  aspirin EC 81 MG tablet Take 81 mg by mouth at bedtime.   Yes Historical Provider, MD  citalopram (CELEXA) 20 MG tablet Take 20 mg by mouth at bedtime.    Yes Historical Provider, MD  cloNIDine (CATAPRES) 0.1 MG tablet Take 0.1 mg by mouth 2 (two) times daily.   Yes Historical Provider, MD  cyclobenzaprine (FLEXERIL) 5 MG tablet Take 1 tablet (5 mg total) by mouth 3 (three) times daily as needed for muscle spasms. 07/13/15  Yes Bobetta Lime, MD  diazepam (VALIUM) 5 MG tablet Take 1 tablet (5 mg total) by mouth every 8 (eight) hours as needed for anxiety. 07/09/15  Yes Bobetta Lime, MD  furosemide (LASIX) 20 MG tablet Take 20 mg by mouth every morning.    Yes Historical Provider, MD  gabapentin (NEURONTIN) 300 MG capsule Take 300-600 mg by mouth See admin instructions. Take 1 capsule by mouth in the morning and take 2 capsules (600mg ) by mouth every night at bedtime.   Yes Historical Provider, MD  gemfibrozil (LOPID) 600 MG tablet Take 600 mg by mouth 2 (two) times daily before a meal.   Yes Historical Provider, MD  glimepiride (AMARYL) 4 MG tablet Take 4 mg by mouth 2 (two) times daily.    Yes Historical Provider, MD  hydrochlorothiazide (HYDRODIURIL) 25 MG tablet Take 25  mg by mouth daily.   Yes Historical Provider, MD  HYDROcodone-acetaminophen (NORCO) 5-325 MG tablet Take 1 tablet by mouth every 12 (twelve) hours as needed for moderate pain. 07/09/15  Yes Bobetta Lime, MD  loratadine (CLARITIN) 10 MG tablet Take 10 mg by mouth daily.    Yes Historical Provider, MD  lovastatin (MEVACOR) 20 MG tablet Take 20 mg by mouth at bedtime.   Yes Historical Provider, MD  magnesium hydroxide (MILK OF MAGNESIA) 400 MG/5ML suspension Take 30 mLs by mouth daily as needed for mild constipation.    Yes Historical Provider, MD  meclizine (ANTIVERT) 25 MG tablet Take 25 mg by mouth every 8 (eight) hours as needed for dizziness.   Yes Historical Provider, MD  meloxicam (MOBIC) 15 MG tablet Take 1 tablet (15  mg total) by mouth daily. 05/01/15  Yes Bobetta Lime, MD  metFORMIN (GLUCOPHAGE) 1000 MG tablet Take 1 tablet (1,000 mg total) by mouth 2 (two) times daily with a meal. 07/03/15  Yes Bobetta Lime, MD  mometasone (ELOCON) 0.1 % lotion Apply 1 application topically as needed (for dry,itchy skin). Pt applies to both ears as needed.   Yes Historical Provider, MD  montelukast (SINGULAIR) 10 MG tablet Take 10 mg by mouth at bedtime.   Yes Historical Provider, MD  nystatin (MYCOSTATIN) 100000 UNIT/ML suspension Take 5 mLs (500,000 Units total) by mouth 4 (four) times daily. Swish retain in mouth long as possible then swallow 05/23/15  Yes Bobetta Lime, MD  potassium chloride (K-DUR) 10 MEQ tablet Take 1 tablet (10 mEq total) by mouth 2 (two) times daily. 07/20/15 07/25/15 Yes Bobetta Lime, MD  SPIRIVA HANDIHALER 18 MCG inhalation capsule INHALE CONTENTS OF 1 CAPSULE ONCE DAILY 07/20/15  Yes Bobetta Lime, MD  theophylline (UNIPHYL) 400 MG 24 hr tablet Take 400 mg by mouth daily.    Yes Historical Provider, MD    Family History  Problem Relation Age of Onset  . Arthritis Mother   . Arthritis Father   . Cancer Sister     brain  . Heart disease Sister   . Stroke Sister   . Diabetes Brother      Social History  Substance Use Topics  . Smoking status: Current Every Day Smoker -- 1.00 packs/day for 45 years    Types: Cigarettes  . Smokeless tobacco: Current User  . Alcohol Use: No    Allergies as of 07/21/2015 - Review Complete 07/21/2015  Allergen Reaction Noted  . Ciprofloxacin Other (See Comments) 11/23/2014  . Inhaler decongestant [methamphetamine] Other (See Comments) 11/23/2014  . Molds & smuts Other (See Comments) 11/23/2014    Review of Systems:    All systems reviewed and negative except where noted in HPI.   Physical Exam:  Vital signs in last 24 hours: Temp:  [97.9 F (36.6 C)-98.6 F (37 C)] 97.9 F (36.6 C) (02/06 1431) Pulse Rate:  [89-93] 89 (02/06 1431) Resp:   [19-20] 19 (02/06 1431) BP: (140-155)/(67-84) 155/67 mmHg (02/06 1431) SpO2:  [98 %] 98 % (02/06 1431) Last BM Date: 07/20/15 General:   Pleasant, cooperative in NAD Head:  Normocephalic and atraumatic. Eyes:   No icterus.   Conjunctiva pink. PERRLA. Ears:  Normal auditory acuity. Neck:  Supple; no masses or thyroidomegaly Lungs: Respirations even and unlabored. Lungs clear to auscultation bilaterally.   No wheezes, crackles, or rhonchi.  Heart:  Regular rate and rhythm;  Without murmur, clicks, rubs or gallops Abdomen:  Soft, nondistended, nontender. Normal bowel sounds. No appreciable masses  or hepatomegaly.  No rebound or guarding.  Rectal:  Not performed. Msk:  Symmetrical without gross deformities.    Extremities:  Without edema, cyanosis or clubbing. Neurologic:  Alert and oriented x3;  grossly normal neurologically. Skin:  Intact without significant lesions or rashes. Cervical Nodes:  No significant cervical adenopathy. Psych:  Alert and cooperative. Normal affect.  LAB RESULTS:  Recent Labs  07/21/15 1652 07/22/15 0607  WBC 9.3 8.9  HGB 11.6* 11.3*  HCT 35.0 33.3*  PLT 524* 540*   BMET  Recent Labs  07/21/15 1652 07/22/15 0607 07/23/15 0455  NA 112* 120* 122*  K 2.9* 3.1* 3.0*  CL 72* 82* 87*  CO2 26 27 22   GLUCOSE 161* 103* 45*  BUN 9 7 7   CREATININE 0.40* 0.36* 0.30*  CALCIUM 9.7 9.2 9.2   LFT  Recent Labs  07/22/15 0607  PROT 7.2  ALBUMIN 3.9  AST 20  ALT 13*  ALKPHOS 114  BILITOT 0.5   PT/INR No results for input(s): LABPROT, INR in the last 72 hours.  STUDIES: Ct Chest W Contrast  07/23/2015  CLINICAL DATA:  Right-sided chest pain, shortness of breath EXAM: CT CHEST WITH CONTRAST TECHNIQUE: Multidetector CT imaging of the chest was performed during intravenous contrast administration. CONTRAST:  41mL OMNIPAQUE IOHEXOL 300 MG/ML  SOLN COMPARISON:  Chest radiograph dated 07/19/2015 and 07/17/2015. Partial comparison to CT abdomen pelvis dated  07/21/2015 and 05/01/2007. FINDINGS: Mediastinum/Nodes: The heart is normal in size. No pericardial effusion. Three vessel coronary atherosclerosis. Atherosclerotic calcifications of the aortic arch. Small mediastinal lymph nodes, including a 9 mm short axis right paratracheal node (series 2/ image 23), within normal limits. Visualized thyroid is notable for a 13 mm left thyroid nodule (series 2/ image 6). Lungs/Pleura: 5 mm nodule at the lateral right lung base (series 3/ image 55), unchanged since 2008, benign. Underlying moderate centrilobular and paraseptal emphysematous changes. Mild dependent atelectasis in the posterior right upper lobe. Mild linear scarring/ atelectasis in the lingula. Mild patchy opacities at the lung bases, likely atelectasis. No focal consolidation. No pleural effusion or pneumothorax. Upper abdomen: Visualized upper abdomen is notable for layering gallbladder sludge and/or noncalcified gallstones (series 2/ image 62) and vascular calcifications. Musculoskeletal: Severe compression fracture deformity at T6, with approximately 60% loss of height, as noted on recent chest radiograph, new from 2016. No retropulsion. Mild degenerative changes of the visualized thoracolumbar spine. Healing right lateral 6th rib fracture deformity (series 2/ image 27). IMPRESSION: 5 mm nodule at the lateral right lung base, unchanged since 2008, benign. Moderate emphysematous changes. Severe compression fracture deformity at T6, with approximately 60% loss of height, as noted on recent chest radiograph, new from 2016. No retropulsion. Healing right lateral 6th rib fracture deformity. Electronically Signed   By: Julian Hy M.D.   On: 07/23/2015 12:07   Ct Abdomen Pelvis W Contrast  07/21/2015  CLINICAL DATA:  Abdominal pain and chest pain, had enema for constipation 3 days ago, patient fell last night EXAM: CT ABDOMEN AND PELVIS WITH CONTRAST TECHNIQUE: Multidetector CT imaging of the abdomen and pelvis  was performed using the standard protocol following bolus administration of intravenous contrast. CONTRAST:  158mL OMNIPAQUE IOHEXOL 300 MG/ML  SOLN COMPARISON:  07/17/2015, 05/01/2007 FINDINGS: Lower chest: 5 mm pulmonary nodule lateral right lung base stable from 2008 and therefore benign. Hepatobiliary: Negative Pancreas: Negative Spleen: Negative Adrenals/Urinary Tract: Mild fullness of both adrenal glands stable likely representing hyperplasia. Tiny low-attenuation bilateral renal lesions stable from 2017 study appearing  most consistent with cysts. Bladder distended. Stomach/Bowel: Negative.  Mild to moderate fecal retention. Vascular/Lymphatic: Negative for acute findings. Heavy atherosclerotic calcification of the aortoiliac vessels noted. Reproductive: No acute findings.  Small calcified uterine fibroid. Other: No ascites. Musculoskeletal: No acute findings IMPRESSION: No significant or acute findings. Electronically Signed   By: Skipper Cliche M.D.   On: 07/21/2015 19:16      Impression / Plan:   Jocelyn Burgess is a 82 y.o. y/o female with constipation. The patient has not had any results of the present laxatives she is taking. The patient will be treated with a bottle of mag citrate at the present time. She will also continue the MiraLAX twice a day. The patient is receiving Dulcolax twice a day. She will also be given an enema in addition to the bottle of magnesium citrate. Pending the results of these she may need to have her medications adjusted. Upon discharge the patient should be on something every day to help her move her bowels. The patient's husband is in explain this and agrees with the plan.  Thank you for involving me in the care of this patient.      LOS: 2 days   Ollen Bowl, MD  07/23/2015, 3:52 PM   Note: This dictation was prepared with Dragon dictation along with smaller phrase technology. Any transcriptional errors that result from this process are  unintentional.

## 2015-07-24 LAB — BASIC METABOLIC PANEL
Anion gap: 11 (ref 5–15)
BUN: 7 mg/dL (ref 6–20)
CALCIUM: 9.4 mg/dL (ref 8.9–10.3)
CO2: 25 mmol/L (ref 22–32)
CREATININE: 0.36 mg/dL — AB (ref 0.44–1.00)
Chloride: 87 mmol/L — ABNORMAL LOW (ref 101–111)
GFR calc Af Amer: >60 mL/min (ref >60)
GLUCOSE: 51 mg/dL — AB (ref 65–99)
Potassium: 3.5 mmol/L (ref 3.5–5.1)
SODIUM: 123 mmol/L — AB (ref 135–145)

## 2015-07-24 LAB — GLUCOSE, CAPILLARY
GLUCOSE-CAPILLARY: 57 mg/dL — AB (ref 65–99)
GLUCOSE-CAPILLARY: 163 mg/dL — AB (ref 65–99)
GLUCOSE-CAPILLARY: 177 mg/dL — AB (ref 65–99)
GLUCOSE-CAPILLARY: 209 mg/dL — AB (ref 65–99)
Glucose-Capillary: 123 mg/dL — ABNORMAL HIGH (ref 65–99)

## 2015-07-24 LAB — PROTEIN ELECTROPHORESIS, SERUM
A/G Ratio: 1.3 (ref 0.7–1.7)
ALBUMIN ELP: 3.5 g/dL (ref 2.9–4.4)
Alpha-1-Globulin: 0.3 g/dL (ref 0.0–0.4)
Alpha-2-Globulin: 1 g/dL (ref 0.4–1.0)
Beta Globulin: 1 g/dL (ref 0.7–1.3)
GAMMA GLOBULIN: 0.5 g/dL (ref 0.4–1.8)
Globulin, Total: 2.8 g/dL (ref 2.2–3.9)
TOTAL PROTEIN ELP: 6.3 g/dL (ref 6.0–8.5)

## 2015-07-24 LAB — SODIUM
SODIUM: 128 mmol/L — AB (ref 135–145)
Sodium: 125 mmol/L — ABNORMAL LOW (ref 135–145)

## 2015-07-24 LAB — OSMOLALITY, URINE: OSMOLALITY UR: 381 mosm/kg (ref 300–900)

## 2015-07-24 LAB — CORTISOL: CORTISOL PLASMA: 17 ug/dL

## 2015-07-24 MED ORDER — DEXTROSE 50 % IV SOLN
25.0000 mL | Freq: Once | INTRAVENOUS | Status: AC
  Administered 2015-07-24: 25 mL via INTRAVENOUS

## 2015-07-24 MED ORDER — POTASSIUM CHLORIDE 20 MEQ PO PACK
20.0000 meq | PACK | Freq: Three times a day (TID) | ORAL | Status: DC
  Administered 2015-07-24 (×3): 20 meq via ORAL
  Filled 2015-07-24 (×4): qty 1

## 2015-07-24 MED ORDER — DEXTROSE 50 % IV SOLN
INTRAVENOUS | Status: AC
  Administered 2015-07-24: 25 mL via INTRAVENOUS
  Filled 2015-07-24: qty 50

## 2015-07-24 MED ORDER — TOLVAPTAN 15 MG PO TABS
15.0000 mg | ORAL_TABLET | ORAL | Status: DC
Start: 2015-07-24 — End: 2015-07-26
  Administered 2015-07-24 – 2015-07-26 (×3): 15 mg via ORAL
  Filled 2015-07-24 (×3): qty 1

## 2015-07-24 NOTE — Evaluation (Signed)
Physical Therapy Evaluation Patient Details Name: Jocelyn Burgess MRN: [id number] DOB: July 06, 1931 Today's Date: 07/24/2015   History of Present Illness  Pt is a 82 y.o. female with PMH of COPD and O2 deficiency (night O2).  CT chest 07/23/15 shows severe compression fx deformity at T6 (no retropulsion).  Pt with recent ED visit for constipation.  Pt now presenting with acute abdominal pain and was admitted for hyponatremia and hypokalemia.      Clinical Impression  Prior to admission pt required physical assistance and cueing with all activities and utilized O2 at night per pt's husband.  Pt lives with husband who is primary caregiver.  Pt was sitting EOB when PT session began.  Pt is a poor historian; however, husband was primary information giver.  Pt was A and O x1 (person).  Pt could not remember where she was or why she was here.  Pt was min guard for sit to stand and bed mobility (sit to side lying and side lying to sit).  During ambulation, pt was not aware of obstacles in her way, and she continually drifted to the left.  Pt required verbal and tactile cues for walker, hand and foot placement and circumventing obstacles in ambulating path.  Pt's O2 saturation was [facility] during the entire session (95-97% O2) on 3 L/min O2 via nasal cannula.  Pt's HR was initially 96 BPM at EOB and after ambulating 90 feet, pt's HR rose to 123 BPM.  Due to aforementioned function and strength deficits, pt is in need of skilled physical therapy.  It is recommended that pt is discharged home with family, with 24 hour assist and home health PT when medically appropriate.     Follow Up Recommendations Home health PT;Supervision/Assistance - 24 hour    Equipment Recommendations  Rolling walker with 5" wheels    Recommendations for Other Services       Precautions / Restrictions Precautions Precautions: Fall Restrictions Weight Bearing Restrictions: No      Mobility  Bed Mobility Overal bed mobility: Needs  Assistance Bed Mobility: Rolling;Sidelying to Sit;Sit to Sidelying Rolling: Min guard Sidelying to sit: Min guard     Sit to sidelying: Min guard    Transfers Overall transfer level: Needs assistance Equipment used: Rolling walker (2 wheeled) Transfers: Sit to/from Stand Sit to Stand: Min guard            Ambulation/Gait Ambulation/Gait assistance: Min guard Ambulation Distance (Feet): 90 Feet Assistive device: Rolling walker (2 wheeled) Gait Pattern/deviations: Step-through pattern;Trendelenburg;Decreased weight shift to left;Drifts right/left Gait velocity: decreased  Verbal and tactile cues for walker, hand and foot placement.  Verbal and tactile cues for circumventing obstacles in ambulating path.  Increased time to perform ambulation required d/t cueing involved.    Stairs            Wheelchair Mobility    Modified Rankin (Stroke Patients Only)       Balance Overall balance assessment: Needs assistance Sitting-balance support: Feet supported Sitting balance-Leahy Scale: Good     Standing balance support: Bilateral upper extremity supported Standing balance-Leahy Scale: Good                               Pertinent Vitals/Pain Pain Assessment: No/denies pain  See flow sheet for vitals.    Home Living Family/patient expects to be discharged to:: Private residence Living Arrangements: Spouse/significant other   Type of Home: Mobile home Home Access: Stairs  to enter Entrance Stairs-Rails: Can reach both Entrance Stairs-Number of Steps: 4 Home Layout: One level Home Equipment: Walker - standard;Cane - single point      Prior Function Level of Independence: Needs assistance   Gait / Transfers Assistance Needed: moderate ; assist with ambulation  ADL's / Homemaking Assistance Needed: moderate ; assist with dressing and bathing        Hand Dominance        Extremity/Trunk Assessment   Upper Extremity Assessment: Overall WFL  for tasks assessed           Lower Extremity Assessment: Overall WFL for tasks assessed      Cervical / Trunk Assessment: Normal  Communication   Communication: No difficulties  Cognition Arousal/Alertness: Awake/alert Behavior During Therapy: WFL for tasks assessed/performed Overall Cognitive Status: History of cognitive impairments - at baseline                      General Comments   Nursing was contacted and cleared pt for physical therapy.  Pt was agreeable and tolerated session well.  Pt's husband requested a manual wheel chair for community mobility.     Exercises  Gait training treatment 8 minutes:  See above for gait details      Assessment/Plan    PT Assessment Patient needs continued PT services  PT Diagnosis Generalized weakness;Difficulty walking   PT Problem List Decreased balance;Decreased activity tolerance;Decreased cognition;Decreased knowledge of use of DME;Decreased safety awareness;Decreased mobility  PT Treatment Interventions DME instruction;Gait training;Stair training;Functional mobility training;Therapeutic activities;Therapeutic exercise;Patient/family education;Balance training   PT Goals (Current goals can be found in the Care Plan section) Acute Rehab PT Goals Patient Stated Goal: to go home  PT Goal Formulation: With patient Time For Goal Achievement: 08/07/15 Potential to Achieve Goals: Good    Frequency Min 2X/week   Barriers to discharge        Co-evaluation               End of Session Equipment Utilized During Treatment: Gait belt;Oxygen (3 L/min) Activity Tolerance: Patient tolerated treatment well Patient left: in bed;with call bell/phone within reach;with family/visitor present;with bed alarm set Nurse Communication: Mobility status         Time: [id number] PT Time Calculation (min) (ACUTE ONLY): 36 min   Charges:         PT G Codes:       Jocelyn Burgess, SPT Jocelyn Burgess 07/24/2015, 11:50  AM

## 2015-07-24 NOTE — Progress Notes (Addendum)
[place] [facility] [place] NAME: Jocelyn Burgess    MR#:  [id number]  DATE OF BIRTH:  [birth year]  SUBJECTIVE: 82 year old female patient admitted for hyponatremia. Patient has underlying COPD, diabetes, depression. Patient has severe constipation, she was seen in the emergency room 2 days ago. Admitted because of hyponatremia, hypokalemia. Sodium improved to 112-120. A shunt lost IV access, refused IV access. According to patient's husband patient is going downhill for the past 1 week.  CHIEF COMPLAINT:   Chief Complaint  Patient presents with  . Abdominal Pain  . Constipation  . Chest Pain  . Fall  No complaint except weakness, tolerated PT this am. Had BM yesterday.  REVIEW OF SYSTEMS:    Review of Systems  Constitutional: Negative for fever and chills. but has generalized weakness. HENT: Negative for hearing loss.   Eyes: Negative for blurred vision, double vision and photophobia.  Respiratory: No cough or sputume. Negative for hemoptysis and shortness of breath.   Cardiovascular: Negative for palpitations, orthopnea and leg swelling.  Gastrointestinal: No constipation. Negative for vomiting, abdominal pain and diarrhea.  Genitourinary: Negative for dysuria and urgency.  Musculoskeletal: Negative for myalgias and neck pain.  Skin: Negative for rash.  Neurological: Negative for dizziness, focal weakness, seizures, weakness and headaches.  Psychiatric/Behavioral: Negative for memory loss. The patient does not have insomnia.     Nutrition:  Tolerating Diet: Tolerating PT:      DRUG ALLERGIES:   Allergies  Allergen Reactions  . Ciprofloxacin Other (See Comments)    Reaction:  Unknown   . Inhaler Decongestant [Methamphetamine] Other (See Comments)    Reaction:  Unknown   . Molds & Smuts Other (See Comments)    Reaction:  Wheezing/itchy,watery eyes     VITALS:  Blood pressure 110/50, pulse 104, temperature 98.7 F (37.1 C),  temperature source Oral, resp. rate 20, height 5\' 8"  (1.727 m), weight 79.788 kg (175 lb 14.4 oz), SpO2 99 %.  PHYSICAL EXAMINATION:   Physical Exam  GENERAL:  82 y.o.-year-old patient lying in the bed with no acute distress.  EYES: Pupils equal, round, reactive to light and accommodation. No scleral icterus. Extraocular muscles intact.  HEENT: Head atraumatic, normocephalic. Oropharynx and nasopharynx clear.  NECK:  Supple, no jugular venous distention. No thyroid enlargement, no tenderness.  LUNGS: Normal breath sounds bilaterally, no wheezing, rales,rhonchi or crepitation. No use of accessory muscles of respiration.  CARDIOVASCULAR: S1, S2 normal. No murmurs, rubs, or gallops.  ABDOMEN: Soft, nontender, nondistended. Bowel sounds present. No organomegaly or mass.  EXTREMITIES: No pedal edema, cyanosis, or clubbing.  NEUROLOGIC: Cranial nerves II through XII are intact. Muscle strength 5/5 in all extremities. Sensation intact. Gait not checked.  PSYCHIATRIC: Confused. SKIN: No obvious rash, lesion, or ulcer.    LABORATORY PANEL:   CBC  Recent Labs Lab 07/22/15 0607  WBC 8.9  HGB 11.3*  HCT 33.3*  PLT 540*   ------------------------------------------------------------------------------------------------------------------  Chemistries   Recent Labs Lab 07/22/15 0607 07/23/15 0455 07/24/15 0643 07/24/15 1236  NA 120* 122* 123* 125*  K 3.1* 3.0* 3.5  --   CL 82* 87* 87*  --   CO2 27 22 25   --   GLUCOSE 103* 45* 51*  --   BUN 7 7 7   --   CREATININE 0.36* 0.30* 0.36*  --   CALCIUM 9.2 9.2 9.4  --   MG  --  1.7  --   --   AST 20  --   --   --  ALT 13*  --   --   --   ALKPHOS 114  --   --   --   BILITOT 0.5  --   --   --    ------------------------------------------------------------------------------------------------------------------  Cardiac Enzymes  Recent Labs Lab 07/21/15 1652  TROPONINI 0.03    ------------------------------------------------------------------------------------------------------------------  RADIOLOGY:  Ct Chest W Contrast  07/23/2015  CLINICAL DATA:  Right-sided chest pain, shortness of breath EXAM: CT CHEST WITH CONTRAST TECHNIQUE: Multidetector CT imaging of the chest was performed during intravenous contrast administration. CONTRAST:  78mL OMNIPAQUE IOHEXOL 300 MG/ML  SOLN COMPARISON:  Chest radiograph dated 07/19/2015 and 07/17/2015. Partial comparison to CT abdomen pelvis dated 07/21/2015 and 05/01/2007. FINDINGS: Mediastinum/Nodes: The heart is normal in size. No pericardial effusion. Three vessel coronary atherosclerosis. Atherosclerotic calcifications of the aortic arch. Small mediastinal lymph nodes, including a 9 mm short axis right paratracheal node (series 2/ image 23), within normal limits. Visualized thyroid is notable for a 13 mm left thyroid nodule (series 2/ image 6). Lungs/Pleura: 5 mm nodule at the lateral right lung base (series 3/ image 55), unchanged since 2008, benign. Underlying moderate centrilobular and paraseptal emphysematous changes. Mild dependent atelectasis in the posterior right upper lobe. Mild linear scarring/ atelectasis in the lingula. Mild patchy opacities at the lung bases, likely atelectasis. No focal consolidation. No pleural effusion or pneumothorax. Upper abdomen: Visualized upper abdomen is notable for layering gallbladder sludge and/or noncalcified gallstones (series 2/ image 62) and vascular calcifications. Musculoskeletal: Severe compression fracture deformity at T6, with approximately 60% loss of height, as noted on recent chest radiograph, new from 2016. No retropulsion. Mild degenerative changes of the visualized thoracolumbar spine. Healing right lateral 6th rib fracture deformity (series 2/ image 27). IMPRESSION: 5 mm nodule at the lateral right lung base, unchanged since 2008, benign. Moderate emphysematous changes. Severe  compression fracture deformity at T6, with approximately 60% loss of height, as noted on recent chest radiograph, new from 2016. No retropulsion. Healing right lateral 6th rib fracture deformity. Electronically Signed   By: Julian Hy M.D.   On: 07/23/2015 12:07     ASSESSMENT AND PLAN:   Principal Problem:   Acute hyponatremia Active Problems:   Constipation, slow transit   Abdominal pain   Hypokalemia   AP (abdominal pain)   #1. Acute on chronic hyponatremia and due to hypovolemic hyponatremia. Improving with IV hydration. But Na is still low at 122. Per Dr. Holley Raring, nephrology consult, start [place] today, follow up Na Q6h. Discontinue HCTZ and lasix.  CT of the chest: 5 mm nodule at the lateral right lung base, unchanged since 2008, Benign. Moderate emphysematous changes.  #2 hypokalemia; improved with potassium supplementation. Continue KCl po. #3 severe constipation use stool softeners. CT of abdomen did not show any acute bowel obstruction. Continue Dulcolax, Colace, MiraLAX. Had BM yesterday.  #4 chronic respiratory failure: Due to COPD: No wheezing at this time. Continue oxygen, Spiriva, Singulair, albuterol. Patient is a heavy smoker advised to quit. Continue theophylline also.  #5 essential hypertension: #6 hyperlipidemia continue pravastatin. #7.Diabetes mellitus II. contiue  Hold Amaryl and metformin.  #8 Altered mental status: Possibly metabolic encephalopathy from hyponatremia hypokalemia. According to husband she is going downhill may be starting to have early dementia also. Better.   Continue PT.  I discussed with Dr. Holley Raring.   All the records are reviewed and case discussed with Care Management/Social Workerr. Management plans discussed with the patient, her husband and they are in agreement.  CODE STATUS: full code.  TOTAL TIME TAKING CARE OF THIS PATIENT: 39 minutes.  Greater than 50% time was spent on coordination of care and face-to-face  counseling. POSSIBLE D/C IN 1-2 DAYS, DEPENDING ON CLINICAL CONDITION.   Demetrios Loll M.D on 07/24/2015 at 2:01 PM  Between 7am to 6pm - Pager - [phone]  After 6pm go to [email or web address] - password [email or web address] [facility]  Office  [phone]  CC: Primary care physician; Bobetta Lime, MD

## 2015-07-24 NOTE — Plan of Care (Signed)
Problem: Bowel/Gastric: Goal: Will not experience complications related to bowel motility Outcome: Progressing 4 bowel movements tonight after mag citrate & fleet enema yesterday evening.

## 2015-07-24 NOTE — Progress Notes (Signed)
Pt's CBG was 57 this morning.  Pt refused to drink juice or anything by mouth.  Hypoglycemic protocol started and 25 ml D50 given IV.  Dr. Vianne Bulls paged, no call back yet. CBG recheck was 163.  Clarise Cruz, RN

## 2015-07-24 NOTE — Care Management Important Message (Signed)
Important Message  Patient Details  Name: Jocelyn Burgess MRN: [id number] Date of Birth: Aug 01, 1931   Medicare Important Message Given:  Yes    Beau Fanny, RN 07/24/2015, 9:55 AM

## 2015-07-24 NOTE — Progress Notes (Addendum)
Pt's CHG was 46.  Checked twice, hypoglycemic protocol done.  Pt received 107ml D50 IV and 1 orange juice.  CBG rechecked in 30 minutes was 118.  Dr. Vianne Bulls made aware.  Clarise Cruz, RN

## 2015-07-24 NOTE — Progress Notes (Signed)
Inpatient Diabetes Program Recommendations  AACE/ADA: New Consensus Statement on Inpatient Glycemic Control (2015)  Target Ranges:  Prepandial:   less than 140 mg/dL      Peak postprandial:   less than 180 mg/dL (1-2 hours)      Critically ill patients:  140 - 180 mg/dL   Review of Glycemic Control  Results for Jocelyn Burgess, Jocelyn Burgess (MRN [id number]) as of 07/24/2015 10:04  Ref. Range 07/23/2015 17:29 07/23/2015 18:03 07/23/2015 20:46 07/24/2015 07:39 07/24/2015 08:32  Glucose-Capillary Latest Ref Range: 65-99 mg/dL 46 (L) 118 (H) 110 (H) 57 (L) 163 (H)    Diabetes history: Type 2 Outpatient Diabetes medications: Glucophage 1000mg  bid, Amaryl 4mg  bid Current orders for Inpatient glycemic control: Novolog correction insulin 0-9 units tid  Inpatient Diabetes Program Recommendations:  Low blood sugars yesterday and this am likely as a result of Glimiperide given yesterday- noted it was d/c'd yesterday- agree.  Gentry Fitz, RN, BA, MHA, CDE Diabetes Coordinator Inpatient Diabetes Program  [phone] (Team Pager) [phone] ([place]) 07/24/2015 10:08 AM

## 2015-07-24 NOTE — Progress Notes (Signed)
Subjective: The patient has had multiple bowel movements since yesterday and reports no abd pain.    Objective: Vital signs in last 24 hours: Filed Vitals:   07/23/15 2050 07/24/15 0500 07/24/15 0524 07/24/15 0959  BP: 148/69  126/72 155/62  Pulse: 91  91 86  Temp: 98.7 F (37.1 C)  98.3 F (36.8 C)   TempSrc: Oral  Oral   Resp: 20  20   Height:      Weight:  175 lb 14.4 oz (79.788 kg)    SpO2: 98%  97%    Weight change:   Intake/Output Summary (Last 24 hours) at 07/24/15 1017 Last data filed at 07/24/15 0557  Gross per 24 hour  Intake    240 ml  Output   1600 ml  Net  -1360 ml     Exam: Heart:: Regular rate and rhythm Lungs: normal Abdomen: soft, nontender, normal bowel sounds   Lab Results: @LABTEST2 @ Micro Results: No results found for this or any previous visit (from the past 240 hour(s)). Studies/Results: Ct Chest W Contrast  07/23/2015  CLINICAL DATA:  Right-sided chest pain, shortness of breath EXAM: CT CHEST WITH CONTRAST TECHNIQUE: Multidetector CT imaging of the chest was performed during intravenous contrast administration. CONTRAST:  66mL OMNIPAQUE IOHEXOL 300 MG/ML  SOLN COMPARISON:  Chest radiograph dated 07/19/2015 and 07/17/2015. Partial comparison to CT abdomen pelvis dated 07/21/2015 and 05/01/2007. FINDINGS: Mediastinum/Nodes: The heart is normal in size. No pericardial effusion. Three vessel coronary atherosclerosis. Atherosclerotic calcifications of the aortic arch. Small mediastinal lymph nodes, including a 9 mm short axis right paratracheal node (series 2/ image 23), within normal limits. Visualized thyroid is notable for a 13 mm left thyroid nodule (series 2/ image 6). Lungs/Pleura: 5 mm nodule at the lateral right lung base (series 3/ image 55), unchanged since 2008, benign. Underlying moderate centrilobular and paraseptal emphysematous changes. Mild dependent atelectasis in the posterior right upper lobe. Mild linear scarring/ atelectasis in the  lingula. Mild patchy opacities at the lung bases, likely atelectasis. No focal consolidation. No pleural effusion or pneumothorax. Upper abdomen: Visualized upper abdomen is notable for layering gallbladder sludge and/or noncalcified gallstones (series 2/ image 62) and vascular calcifications. Musculoskeletal: Severe compression fracture deformity at T6, with approximately 60% loss of height, as noted on recent chest radiograph, new from 2016. No retropulsion. Mild degenerative changes of the visualized thoracolumbar spine. Healing right lateral 6th rib fracture deformity (series 2/ image 27). IMPRESSION: 5 mm nodule at the lateral right lung base, unchanged since 2008, benign. Moderate emphysematous changes. Severe compression fracture deformity at T6, with approximately 60% loss of height, as noted on recent chest radiograph, new from 2016. No retropulsion. Healing right lateral 6th rib fracture deformity. Electronically Signed   By: Julian Hy M.D.   On: 07/23/2015 12:07   Medications: I have reviewed the patient's current medications. Scheduled Meds: . aspirin EC  81 mg Oral QHS  . citalopram  20 mg Oral QHS  . cloNIDine  0.1 mg Oral BID  . docusate sodium  100 mg Oral BID  . enoxaparin (LOVENOX) injection  40 mg Subcutaneous Q24H  . gabapentin  300 mg Oral Q0600  . gabapentin  600 mg Oral QHS  . gemfibrozil  600 mg Oral BID AC  . haloperidol lactate  2 mg Intramuscular Once  . hydrochlorothiazide  25 mg Oral Daily  . insulin aspart  0-9 Units Subcutaneous TID WC  . loratadine  10 mg Oral Daily  . meloxicam  15  mg Oral Daily  . montelukast  10 mg Oral QHS  . nicotine  14 mg Transdermal Daily  . nystatin  5 mL Oral QID  . polyethylene glycol  17 g Oral BID  . potassium chloride  20 mEq Oral TID  . pravastatin  20 mg Oral q1800  . sodium chloride  1 g Oral BID  . theophylline  400 mg Oral Daily  . tiotropium  18 mcg Inhalation Daily  . tolvaptan  15 mg Oral Q24H   Continuous  Infusions: . sodium chloride 50 mL/hr at 07/23/15 2045   PRN Meds:.acetaminophen, albuterol, cyclobenzaprine, diazepam, HYDROcodone-acetaminophen, magnesium citrate, magnesium hydroxide, meclizine, ondansetron **OR** [DISCONTINUED] ondansetron (ZOFRAN) IV   Assessment: Principal Problem:   Acute hyponatremia Active Problems:   Constipation, slow transit   Abdominal pain   Hypokalemia   AP (abdominal pain)    Plan: Patient without any abd pain. Now moving her bowels. Will sign off.   LOS: 3 days   Daren Allen Norris 07/24/2015, 10:17 AM

## 2015-07-24 NOTE — Progress Notes (Addendum)
MEDICATION RELATED CONSULT NOTE - INITIAL   Pharmacy Consult for Electrolyte monitoring (K replacement) Indication: hypokalemia  Allergies  Allergen Reactions  . Ciprofloxacin Other (See Comments)    Reaction:  Unknown   . Inhaler Decongestant [Methamphetamine] Other (See Comments)    Reaction:  Unknown   . Molds & Smuts Other (See Comments)    Reaction:  Wheezing/itchy,watery eyes     Patient Measurements: Height: 5\' 8"  (172.7 cm) Weight: 175 lb 14.4 oz (79.788 kg) IBW/kg (Calculated) : 63.9   Vital Signs: Temp: 98.3 F (36.8 C) (02/07 0524) Temp Source: Oral (02/07 0524) BP: 126/72 mmHg (02/07 0524) Pulse Rate: 91 (02/07 0524) Intake/Output from previous day: 02/06 0701 - 02/07 0700 In: 480 [P.O.:480] Out: 2200 [Urine:2200] Intake/Output from this shift:    Labs:  Recent Labs  07/21/15 1652 07/22/15 0607 07/23/15 0455 07/24/15 0643  WBC 9.3 8.9  --   --   HGB 11.6* 11.3*  --   --   HCT 35.0 33.3*  --   --   PLT 524* 540*  --   --   CREATININE 0.40* 0.36* 0.30* 0.36*  MG  --   --  1.7  --   ALBUMIN 4.0 3.9  --   --   PROT 7.7 7.2  --   --   AST 22 20  --   --   ALT 16 13*  --   --   ALKPHOS 126 114  --   --   BILITOT 1.0 0.5  --   --    Estimated Creatinine Clearance: 61.2 mL/min (by C-G formula based on Cr of 0.36).   Microbiology: No results found for this or any previous visit (from the past 720 hour(s)).   Assessment: 82 yo female with hypokalemia this admission despite supplementation. MD requesting supplementation to be PO only (IV issues).    Goal of Therapy:  K 3.5-5.0 Mg WNL  Plan:   Will continue patient on potassium 35mEq PO TID.   Will obtain follow-up BMP with am labs.   Pharmacy will continue to monitor and adjust per consult.   Meet [place] L 07/24/2015,9:15 AM

## 2015-07-24 NOTE — Progress Notes (Signed)
[facility]  ROUNDING NOTE   Subjective:  Labs indicate an element of SIADH as urine Oswald dehydrated and serum osmolality. Uric acid also low which points towards SIADH. Sodium currently 123. We have discussed use of tolvaptan with the patient.   Objective:  Vital signs in last 24 hours:  Temp:  [97.9 F (36.6 C)-98.7 F (37.1 C)] 98.3 F (36.8 C) (02/07 0524) Pulse Rate:  [86-91] 86 (02/07 0959) Resp:  [19-20] 20 (02/07 0524) BP: (126-155)/(62-72) 155/62 mmHg (02/07 0959) SpO2:  [97 %-98 %] 97 % (02/07 0524) Weight:  [79.788 kg (175 lb 14.4 oz)] 79.788 kg (175 lb 14.4 oz) (02/07 0500)  Weight change:  Filed Weights   07/21/15 2147 07/22/15 0500 07/24/15 0500  Weight: 78.427 kg (172 lb 14.4 oz) 78.427 kg (172 lb 14.4 oz) 79.788 kg (175 lb 14.4 oz)    Intake/Output: I/O last 3 completed shifts: In: 480 [P.O.:480] Out: 2200 [Urine:2200]   Intake/Output this shift:     Physical Exam: General: NAD  Head: Normocephalic, atraumatic. Moist oral mucosal membranes  Eyes: Anicteric  Neck: Supple, trachea midline  Lungs:  Scattered rhonchi, normal effort  Heart: Regular rate and rhythm  Abdomen:  Soft, nontender, BS present  Extremities: trace peripheral edema.  Neurologic: Nonfocal, moving all four extremities  Skin: No lesions       Basic Metabolic Panel:  Recent Labs Lab 07/19/15 1339 07/21/15 1652 07/22/15 0607 07/23/15 0455 07/24/15 0643  NA 123* 112* 120* 122* 123*  K 3.1* 2.9* 3.1* 3.0* 3.5  CL 79* 72* 82* 87* 87*  CO2 31 26 27 22 25   GLUCOSE 160* 161* 103* 45* 51*  BUN 9 9 7 7 7   CREATININE 0.50 0.40* 0.36* 0.30* 0.36*  CALCIUM 9.9 9.7 9.2 9.2 9.4  MG  --   --   --  1.7  --     Liver Function Tests:  Recent Labs Lab 07/17/15 1545 07/21/15 1652 07/22/15 0607  AST 26 22 20   ALT 15 16 13*  ALKPHOS 101 126 114  BILITOT 0.4 1.0 0.5  PROT 8.0 7.7 7.2  ALBUMIN 4.2 4.0 3.9    Recent Labs Lab 07/17/15 1545  LIPASE 24   No  results for input(s): AMMONIA in the last 168 hours.  CBC:  Recent Labs Lab 07/17/15 1545 07/19/15 1339 07/21/15 1652 07/22/15 0607  WBC 9.0 10.9 9.3 8.9  NEUTROABS 6.8* 7.8*  --   --   HGB 11.5* 11.3* 11.6* 11.3*  HCT 34.6* 34.2* 35.0 33.3*  MCV 78.2* 78.1* 77.6* 76.3*  PLT 498* 495* 524* 540*    Cardiac Enzymes:  Recent Labs Lab 07/17/15 1545 07/21/15 1652  TROPONINI <0.03 0.03    BNP: Invalid input(s): POCBNP  CBG:  Recent Labs Lab 07/23/15 1729 07/23/15 1803 07/23/15 2046 07/24/15 0739 07/24/15 0832  GLUCAP 46* 118* 110* 85* 163*    Microbiology: Results for orders placed or performed in visit on 04/17/15  Urine Culture     Status: None   Collection Time: 04/17/15 12:00 AM  Result Value Ref Range Status   Urine Culture, Routine Final report  Final   Urine Culture result 1 Comment  Final    Comment: Mixed urogenital flora Less than 10,000 colonies/mL     Coagulation Studies: No results for input(s): LABPROT, INR in the last 72 hours.  Urinalysis: No results for input(s): COLORURINE, LABSPEC, PHURINE, GLUCOSEU, HGBUR, BILIRUBINUR, KETONESUR, PROTEINUR, UROBILINOGEN, NITRITE, LEUKOCYTESUR in the last 72 hours.  Invalid input(s): APPERANCEUR  Imaging: Ct Chest W Contrast  07/23/2015  CLINICAL DATA:  Right-sided chest pain, shortness of breath EXAM: CT CHEST WITH CONTRAST TECHNIQUE: Multidetector CT imaging of the chest was performed during intravenous contrast administration. CONTRAST:  68mL OMNIPAQUE IOHEXOL 300 MG/ML  SOLN COMPARISON:  Chest radiograph dated 07/19/2015 and 07/17/2015. Partial comparison to CT abdomen pelvis dated 07/21/2015 and 05/01/2007. FINDINGS: Mediastinum/Nodes: The heart is normal in size. No pericardial effusion. Three vessel coronary atherosclerosis. Atherosclerotic calcifications of the aortic arch. Small mediastinal lymph nodes, including a 9 mm short axis right paratracheal node (series 2/ image 23), within normal  limits. Visualized thyroid is notable for a 13 mm left thyroid nodule (series 2/ image 6). Lungs/Pleura: 5 mm nodule at the lateral right lung base (series 3/ image 55), unchanged since 2008, benign. Underlying moderate centrilobular and paraseptal emphysematous changes. Mild dependent atelectasis in the posterior right upper lobe. Mild linear scarring/ atelectasis in the lingula. Mild patchy opacities at the lung bases, likely atelectasis. No focal consolidation. No pleural effusion or pneumothorax. Upper abdomen: Visualized upper abdomen is notable for layering gallbladder sludge and/or noncalcified gallstones (series 2/ image 62) and vascular calcifications. Musculoskeletal: Severe compression fracture deformity at T6, with approximately 60% loss of height, as noted on recent chest radiograph, new from 2016. No retropulsion. Mild degenerative changes of the visualized thoracolumbar spine. Healing right lateral 6th rib fracture deformity (series 2/ image 27). IMPRESSION: 5 mm nodule at the lateral right lung base, unchanged since 2008, benign. Moderate emphysematous changes. Severe compression fracture deformity at T6, with approximately 60% loss of height, as noted on recent chest radiograph, new from 2016. No retropulsion. Healing right lateral 6th rib fracture deformity. Electronically Signed   By: Julian Hy M.D.   On: 07/23/2015 12:07     Medications:   . sodium chloride 50 mL/hr at 07/23/15 2045   . aspirin EC  81 mg Oral QHS  . citalopram  20 mg Oral QHS  . cloNIDine  0.1 mg Oral BID  . docusate sodium  100 mg Oral BID  . enoxaparin (LOVENOX) injection  40 mg Subcutaneous Q24H  . gabapentin  300 mg Oral Q0600  . gabapentin  600 mg Oral QHS  . gemfibrozil  600 mg Oral BID AC  . haloperidol lactate  2 mg Intramuscular Once  . hydrochlorothiazide  25 mg Oral Daily  . insulin aspart  0-9 Units Subcutaneous TID WC  . loratadine  10 mg Oral Daily  . meloxicam  15 mg Oral Daily  .  montelukast  10 mg Oral QHS  . nicotine  14 mg Transdermal Daily  . nystatin  5 mL Oral QID  . polyethylene glycol  17 g Oral BID  . potassium chloride  20 mEq Oral TID  . pravastatin  20 mg Oral q1800  . sodium chloride  1 g Oral BID  . theophylline  400 mg Oral Daily  . tiotropium  18 mcg Inhalation Daily  . tolvaptan  15 mg Oral Q24H   acetaminophen, albuterol, cyclobenzaprine, diazepam, HYDROcodone-acetaminophen, magnesium citrate, magnesium hydroxide, meclizine, ondansetron **OR** [DISCONTINUED] ondansetron (ZOFRAN) IV  Assessment/ Plan:  82 y.o. female with a PMHx of diabetes mellitus, osteoarthritis, asthma,COPD, depression, hyperlipidemia, hypertension, who was admitted to [facility] on 07/21/2015 for evaluation of constipation.  1. Hyponatremia in the setting of constipation, poor po intake, ? Excessive water ingesttion to relieve constipation. Patient also was taking lasix.  2. Constipation. 3. Anemia unspecified.   Plan:  Urine osmolality higher than serum osmolality  and uric acid low.  This points towards SIADH.  Sodium currently 123.  We will attempt a trial tolvaptan and check serum sodium frequently.  Goal sodium bicarbonate tomorrow is 130.  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  She is also noted to be on hydrochlorothiazide which we will stop.  Continue potassium repletion as well.  We will discontinue sodium chloride at this time.  LOS: 3 Pandora Mccrackin 2/7/201710:34 AM

## 2015-07-25 ENCOUNTER — Ambulatory Visit: Payer: Medicare Other | Admitting: Family Medicine

## 2015-07-25 ENCOUNTER — Ambulatory Visit: Payer: Self-pay | Admitting: Family Medicine

## 2015-07-25 ENCOUNTER — Inpatient Hospital Stay: Payer: Medicare Other

## 2015-07-25 LAB — PROTEIN ELECTRO, RANDOM URINE
ALPHA-1-GLOBULIN, U: 3.2 %
ALPHA-2-GLOBULIN, U: 8.4 %
Albumin ELP, Urine: 58.1 %
BETA GLOBULIN, U: 21.1 %
Gamma Globulin, U: 9.2 %
Total Protein, Urine: 22.9 mg/dL

## 2015-07-25 LAB — BASIC METABOLIC PANEL
ANION GAP: 7 (ref 5–15)
BUN: 12 mg/dL (ref 6–20)
CALCIUM: 9.4 mg/dL (ref 8.9–10.3)
CO2: 27 mmol/L (ref 22–32)
Chloride: 94 mmol/L — ABNORMAL LOW (ref 101–111)
Creatinine, Ser: 0.54 mg/dL (ref 0.44–1.00)
GFR calc Af Amer: >60 mL/min (ref >60)
GLUCOSE: 219 mg/dL — AB (ref 65–99)
Potassium: 4.5 mmol/L (ref 3.5–5.1)
Sodium: 128 mmol/L — ABNORMAL LOW (ref 135–145)

## 2015-07-25 LAB — SODIUM
SODIUM: 129 mmol/L — AB (ref 135–145)
SODIUM: 129 mmol/L — AB (ref 135–145)
SODIUM: 130 mmol/L — AB (ref 135–145)

## 2015-07-25 LAB — GLUCOSE, CAPILLARY
Glucose-Capillary: 223 mg/dL — ABNORMAL HIGH (ref 65–99)
Glucose-Capillary: 277 mg/dL — ABNORMAL HIGH (ref 65–99)
Glucose-Capillary: 206 mg/dL — ABNORMAL HIGH (ref 65–99)
Glucose-Capillary: 207 mg/dL — ABNORMAL HIGH (ref 65–99)

## 2015-07-25 LAB — PHOSPHORUS: Phosphorus: 2.9 mg/dL (ref 2.5–4.6)

## 2015-07-25 LAB — MAGNESIUM: Magnesium: 2 mg/dL (ref 1.7–2.4)

## 2015-07-25 MED ORDER — POTASSIUM CHLORIDE 20 MEQ PO PACK
20.0000 meq | PACK | Freq: Two times a day (BID) | ORAL | Status: DC
  Administered 2015-07-25: 20 meq via ORAL

## 2015-07-25 MED ORDER — INSULIN GLARGINE 100 UNIT/ML ~~LOC~~ SOLN
10.0000 [IU] | Freq: Every day | SUBCUTANEOUS | Status: DC
  Administered 2015-07-25: 10 [IU] via SUBCUTANEOUS
  Filled 2015-07-25 (×2): qty 0.1

## 2015-07-25 NOTE — Progress Notes (Signed)
[facility]  ROUNDING NOTE   Subjective:  Sodium appears to have stabilized in the upper 120s. Has tolerated tolvaptan well. Due for second dose today.    Objective:  Vital signs in last 24 hours:  Temp:  [97.7 F (36.5 C)-98.5 F (36.9 C)] 97.7 F (36.5 C) (02/08 1244) Pulse Rate:  [97-115] 100 (02/08 1244) Resp:  [18-20] 20 (02/08 1244) BP: (127-139)/(59-72) 135/71 mmHg (02/08 1244) SpO2:  [98 %] 98 % (02/08 1244) Weight:  [74.753 kg (164 lb 12.8 oz)] 74.753 kg (164 lb 12.8 oz) (02/07 2023)  Weight change: -5.035 kg (-11 lb 1.6 oz) Filed Weights   07/22/15 0500 07/24/15 0500 07/24/15 2023  Weight: 78.427 kg (172 lb 14.4 oz) 79.788 kg (175 lb 14.4 oz) 74.753 kg (164 lb 12.8 oz)    Intake/Output: I/O last 3 completed shifts: In: 600 [P.O.:600] Out: 400 [Urine:400]   Intake/Output this shift:  Total I/O In: 120 [P.O.:120] Out: -   Physical Exam: General: NAD  Head: Normocephalic, atraumatic. Moist oral mucosal membranes  Eyes: Anicteric  Neck: Supple, trachea midline  Lungs:  Scattered rhonchi, normal effort  Heart: Regular rate and rhythm  Abdomen:  Soft, nontender, BS present  Extremities: trace peripheral edema.  Neurologic: Nonfocal, moving all four extremities  Skin: No lesions       Basic Metabolic Panel:  Recent Labs Lab 07/21/15 1652 07/22/15 0607 07/23/15 0455 07/24/15 0643 07/24/15 1236 07/24/15 1937 07/25/15 0240 07/25/15 0748  NA 112* 120* 122* 123* 125* 128* 129* 128*  K 2.9* 3.1* 3.0* 3.5  --   --   --  4.5  CL 72* 82* 87* 87*  --   --   --  94*  CO2 26 27 22 25   --   --   --  27  GLUCOSE 161* 103* 45* 51*  --   --   --  219*  BUN 9 7 7 7   --   --   --  12  CREATININE 0.40* 0.36* 0.30* 0.36*  --   --   --  0.54  CALCIUM 9.7 9.2 9.2 9.4  --   --   --  9.4  MG  --   --  1.7  --   --   --   --  2.0  PHOS  --   --   --   --   --   --   --  2.9    Liver Function Tests:  Recent Labs Lab 07/21/15 1652 07/22/15 0607   AST 22 20  ALT 16 13*  ALKPHOS 126 114  BILITOT 1.0 0.5  PROT 7.7 7.2  ALBUMIN 4.0 3.9   No results for input(s): LIPASE, AMYLASE in the last 168 hours. No results for input(s): AMMONIA in the last 168 hours.  CBC:  Recent Labs Lab 07/19/15 1339 07/21/15 1652 07/22/15 0607  WBC 10.9 9.3 8.9  NEUTROABS 7.8*  --   --   HGB 11.3* 11.6* 11.3*  HCT 34.2* 35.0 33.3*  MCV 78.1* 77.6* 76.3*  PLT 495* 524* 540*    Cardiac Enzymes:  Recent Labs Lab 07/21/15 1652  TROPONINI 0.03    BNP: Invalid input(s): POCBNP  CBG:  Recent Labs Lab 07/24/15 1107 07/24/15 1610 07/24/15 2120 07/25/15 0748 07/25/15 1133  GLUCAP 177* 123* 209* 223* 277*    Microbiology: Results for orders placed or performed in visit on 04/17/15  Urine Culture     Status: None   Collection Time: 04/17/15  12:00 AM  Result Value Ref Range Status   Urine Culture, Routine Final report  Final   Urine Culture result 1 Comment  Final    Comment: Mixed urogenital flora Less than 10,000 colonies/mL     Coagulation Studies: No results for input(s): LABPROT, INR in the last 72 hours.  Urinalysis: No results for input(s): COLORURINE, LABSPEC, PHURINE, GLUCOSEU, HGBUR, BILIRUBINUR, KETONESUR, PROTEINUR, UROBILINOGEN, NITRITE, LEUKOCYTESUR in the last 72 hours.  Invalid input(s): APPERANCEUR    Imaging: Dg Chest 2 View  07/25/2015  CLINICAL DATA:  Shortness of breath. EXAM: CHEST  2 VIEW COMPARISON:  CT of the chest 07/23/2015 FINDINGS: Cardiomediastinal silhouette is normal. Mediastinal contours appear intact. There is no evidence of focal airspace consolidation, pleural effusion or pneumothorax. There are low lung volumes with moderate bilateral emphysematous changes. Linear opacities in left more than right lung base likely represent atelectasis. Osseous structures are without acute abnormality. Soft tissues are grossly normal. IMPRESSION: Low lung volumes with moderate emphysema. Bilateral lower lobe  atelectatic changes. Electronically Signed   By: Fidela Salisbury M.D.   On: 07/25/2015 10:26     Medications:     . aspirin EC  81 mg Oral QHS  . citalopram  20 mg Oral QHS  . cloNIDine  0.1 mg Oral BID  . docusate sodium  100 mg Oral BID  . enoxaparin (LOVENOX) injection  40 mg Subcutaneous Q24H  . gabapentin  300 mg Oral Q0600  . gabapentin  600 mg Oral QHS  . gemfibrozil  600 mg Oral BID AC  . haloperidol lactate  2 mg Intramuscular Once  . insulin aspart  0-9 Units Subcutaneous TID WC  . loratadine  10 mg Oral Daily  . meloxicam  15 mg Oral Daily  . montelukast  10 mg Oral QHS  . nicotine  14 mg Transdermal Daily  . nystatin  5 mL Oral QID  . polyethylene glycol  17 g Oral BID  . potassium chloride  20 mEq Oral BID  . pravastatin  20 mg Oral q1800  . sodium chloride  1 g Oral BID  . theophylline  400 mg Oral Daily  . tiotropium  18 mcg Inhalation Daily  . tolvaptan  15 mg Oral Q24H   acetaminophen, albuterol, cyclobenzaprine, diazepam, HYDROcodone-acetaminophen, magnesium citrate, magnesium hydroxide, meclizine, ondansetron **OR** [DISCONTINUED] ondansetron (ZOFRAN) IV  Assessment/ Plan:  82 y.o. female with a PMHx of diabetes mellitus, osteoarthritis, asthma,COPD, depression, hyperlipidemia, hypertension, who was admitted to [facility] on 07/21/2015 for evaluation of constipation.  1. Hyponatremia in the setting of constipation, poor po intake, ? Excessive water ingesttion to relieve constipation. Patient also was taking lasix.  2. Constipation. 3. Anemia unspecified. SPEP/UPEP negative.   Plan:  atient has tolerated first dosage of tolvaptan quite well.  We will plan for second dosage today.  She has had a good water diuresis.  Continue to monitor serum sodium closely.  We may need to add salt tablets as an outpatient.  We recommend continued monitoring x1 additional day.  If serum sodium reaches into the 130s she will likely be okay for discharge with close outpatient  followup.  LOS: Burton, Kholton Coate 2/8/20171:43 PM

## 2015-07-25 NOTE — Progress Notes (Signed)
MEDICATION RELATED CONSULT NOTE - INITIAL   Pharmacy Consult for Electrolyte monitoring (K replacement) Indication: hypokalemia  Allergies  Allergen Reactions  . Ciprofloxacin Other (See Comments)    Reaction:  Unknown   . Inhaler Decongestant [Methamphetamine] Other (See Comments)    Reaction:  Unknown   . Molds & Smuts Other (See Comments)    Reaction:  Wheezing/itchy,watery eyes     Patient Measurements: Height: 5\' 8"  (172.7 cm) Weight: 164 lb 12.8 oz (74.753 kg) IBW/kg (Calculated) : 63.9   Vital Signs: Pulse Rate: 97 (02/07 2213) Intake/Output from previous day: 02/07 0701 - 02/08 0700 In: 600 [P.O.:600] Out: -  Intake/Output from this shift:    Labs:  Recent Labs  07/23/15 0455 07/24/15 0643 07/25/15 0748  CREATININE 0.30* 0.36* 0.54  MG 1.7  --  2.0  PHOS  --   --  2.9   Estimated Creatinine Clearance: 55.6 mL/min (by C-G formula based on Cr of 0.54).   Microbiology: No results found for this or any previous visit (from the past 720 hour(s)).   Assessment: 82 yo female with hypokalemia this admission despite supplementation. MD requesting supplementation to be PO only (IV issues).    Goal of Therapy:  K 3.5-5.0 Mg WNL  Plan:  K=4.5, Mg=2, phos=2.9  Will decrease dose to potassium 58mEq PO BID. K has increased from 3.5 to 4.5 in 24 hours on 20 mEq TID. Other electrolytes are WNL. Will recheck tomorrow AM to evaluate maintenance dose.   Will obtain follow-up BMP with am labs.   Pharmacy will continue to monitor and adjust per consult.   Ramond Dial, Pharm.D Clinical Pharmacist 07/25/2015,8:45 AM

## 2015-07-25 NOTE — Progress Notes (Addendum)
[facility] [place] NAME: Jocelyn Burgess    MR#:  [id number]  DATE OF BIRTH:  06-05-1932  SUBJECTIVE: 82 year old female patient admitted for hyponatremia. Patient has underlying COPD, diabetes, depression. Patient has severe constipation, she was seen in the emergency room 2 days ago. Admitted because of hyponatremia, hypokalemia. Sodium improved to 112-120. A shunt lost IV access, refused IV access. According to patient's husband patient is going downhill for the past 1 week.  CHIEF COMPLAINT:   Chief Complaint  Patient presents with  . Abdominal Pain  . Constipation  . Chest Pain  . Fall  cough and mild SOB  REVIEW OF SYSTEMS:    Review of Systems  Constitutional: Negative for fever and chills. but has generalized weakness. HENT: Negative for hearing loss.   Eyes: Negative for blurred vision, double vision and photophobia.  Respiratory: has cough no sputume. Negative for hemoptysis and shortness of breath.   Cardiovascular: Negative for palpitations, orthopnea and leg swelling.  Gastrointestinal: No constipation. Negative for vomiting, abdominal pain and diarrhea.  Genitourinary: Negative for dysuria and urgency.  Musculoskeletal: Negative for myalgias and neck pain.  Skin: Negative for rash.  Neurological: Negative for dizziness, focal weakness, seizures, weakness and headaches.  Psychiatric/Behavioral: Negative for memory loss. The patient does not have insomnia.     Nutrition:  Tolerating Diet: Tolerating PT:      DRUG ALLERGIES:   Allergies  Allergen Reactions  . Ciprofloxacin Other (See Comments)    Reaction:  Unknown   . Inhaler Decongestant [Methamphetamine] Other (See Comments)    Reaction:  Unknown   . Molds & Smuts Other (See Comments)    Reaction:  Wheezing/itchy,watery eyes     VITALS:  Blood pressure 135/71, pulse 100, temperature 97.7 F (36.5 C), temperature source Oral, resp. rate 20, height 5'  8" (1.727 m), weight 74.753 kg (164 lb 12.8 oz), SpO2 98 %.  PHYSICAL EXAMINATION:   Physical Exam  GENERAL:  82 y.o.-year-old patient lying in the bed with no acute distress.  EYES: Pupils equal, round, reactive to light and accommodation. No scleral icterus. Extraocular muscles intact.  HEENT: Head atraumatic, normocephalic. Oropharynx and nasopharynx clear.  NECK:  Supple, no jugular venous distention. No thyroid enlargement, no tenderness.  LUNGS: Normal breath sounds bilaterally, no wheezing, but has crackles. No use of accessory muscles of respiration.  CARDIOVASCULAR: S1, S2 normal. No murmurs, rubs, or gallops.  ABDOMEN: Soft, nontender, nondistended. Bowel sounds present. No organomegaly or mass.  EXTREMITIES: No pedal edema, cyanosis, or clubbing.  NEUROLOGIC: Cranial nerves II through XII are intact. Muscle strength 5/5 in all extremities. Sensation intact. Gait not checked.  PSYCHIATRIC: Confused. SKIN: No obvious rash, lesion, or ulcer.    LABORATORY PANEL:   CBC  Recent Labs Lab 07/22/15 0607  WBC 8.9  HGB 11.3*  HCT 33.3*  PLT 540*   ------------------------------------------------------------------------------------------------------------------  Chemistries   Recent Labs Lab 07/22/15 0607  07/25/15 0748 07/25/15 1409  NA 120*  < > 128* 129*  K 3.1*  < > 4.5  --   CL 82*  < > 94*  --   CO2 27  < > 27  --   GLUCOSE 103*  < > 219*  --   BUN 7  < > 12  --   CREATININE 0.36*  < > 0.54  --   CALCIUM 9.2  < > 9.4  --   MG  --   < > 2.0  --  AST 20  --   --   --   ALT 13*  --   --   --   ALKPHOS 114  --   --   --   BILITOT 0.5  --   --   --   < > = values in this interval not displayed. ------------------------------------------------------------------------------------------------------------------  Cardiac Enzymes  Recent Labs Lab 07/21/15 1652  TROPONINI 0.03    ------------------------------------------------------------------------------------------------------------------  RADIOLOGY:  Dg Chest 2 View  07/25/2015  CLINICAL DATA:  Shortness of breath. EXAM: CHEST  2 VIEW COMPARISON:  CT of the chest 07/23/2015 FINDINGS: Cardiomediastinal silhouette is normal. Mediastinal contours appear intact. There is no evidence of focal airspace consolidation, pleural effusion or pneumothorax. There are low lung volumes with moderate bilateral emphysematous changes. Linear opacities in left more than right lung base likely represent atelectasis. Osseous structures are without acute abnormality. Soft tissues are grossly normal. IMPRESSION: Low lung volumes with moderate emphysema. Bilateral lower lobe atelectatic changes. Electronically Signed   By: Fidela Salisbury M.D.   On: 07/25/2015 10:26     ASSESSMENT AND PLAN:   Principal Problem:   Acute hyponatremia Active Problems:   Constipation, slow transit   Abdominal pain   Hypokalemia   AP (abdominal pain)   #1. Acute on chronic hyponatremia and due to hypovolemic hyponatremia. Improving with IV hydration.  Na is up to 129 after one dose of Samsca. Per Dr. Holley Raring, give one more dose ofSamsca today, follow up Na Q6h. Discontinued HCTZ and lasix.   CT of the chest: 5 mm nodule at the lateral right lung base, unchanged since 2008, Benign. Moderate emphysematous changes.  #2 hypokalemia; improved with potassium supplementation. disontinue KCl po. #3 severe constipation use stool softeners. CT of abdomen did not show any acute bowel obstruction. Continue Dulcolax, Colace, MiraLAX. Had BM.  #4 chronic respiratory failure: Due to COPD:  Continue oxygen, Spiriva, Singulair, albuterol. Patient is a heavy smoker advised to quit. Continue theophylline also.  #5 essential hypertension: #6 hyperlipidemia continue pravastatin. #7.Diabetes mellitus II. contiue  Hold Amaryl and metformin.  #8 Altered mental  status: Possibly metabolic encephalopathy from hyponatremia hypokalemia. According to husband she is going downhill may be starting to have early dementia also. Better.   Continue PT.  I discussed with Dr. Holley Raring.   All the records are reviewed and case discussed with Care Management/Social Workerr. Management plans discussed with the patient, her husband and they are in agreement.  CODE STATUS: full code.  TOTAL TIME TAKING CARE OF THIS PATIENT: 35 minutes.  Greater than 50% time was spent on coordination of care and face-to-face counseling. POSSIBLE D/C home with HHPT IN 1-2 DAYS, DEPENDING ON CLINICAL CONDITION.   Demetrios Loll M.D on 07/25/2015 at 3:54 PM  Between 7am to 6pm - Pager - [phone]  After 6pm go to [email or web address] - password [email or web address] [facility]  Office  [phone]  CC: Primary care physician; Bobetta Lime, MD

## 2015-07-26 LAB — GLUCOSE, CAPILLARY
GLUCOSE-CAPILLARY: 189 mg/dL — AB (ref 65–99)
Glucose-Capillary: 217 mg/dL — ABNORMAL HIGH (ref 65–99)

## 2015-07-26 LAB — SODIUM
SODIUM: 132 mmol/L — AB (ref 135–145)
Sodium: 130 mmol/L — ABNORMAL LOW (ref 135–145)

## 2015-07-26 LAB — POTASSIUM: Potassium: 4.4 mmol/L (ref 3.5–5.1)

## 2015-07-26 MED ORDER — THEOPHYLLINE ER 200 MG PO CP24
400.0000 mg | ORAL_CAPSULE | Freq: Every day | ORAL | Status: DC
  Administered 2015-07-26: 400 mg via ORAL
  Filled 2015-07-26: qty 2

## 2015-07-26 MED ORDER — DOCUSATE SODIUM 100 MG PO CAPS: 100.0000 mg | ORAL_CAPSULE | Freq: Two times a day (BID) | ORAL | Status: DC

## 2015-07-26 MED ORDER — BISACODYL 5 MG PO TBEC: 5.0000 mg | DELAYED_RELEASE_TABLET | Freq: Every day | ORAL | Status: DC | PRN

## 2015-07-26 NOTE — Discharge Instructions (Signed)
Heart healthy and ADA diet. Activity as tolerated. HHPT Continue home O2 [place] 3 L.

## 2015-07-26 NOTE — Progress Notes (Addendum)
MEDICATION RELATED CONSULT NOTE - INITIAL   Pharmacy Consult for Electrolyte monitoring (K replacement) Indication: hypokalemia  Allergies  Allergen Reactions  . Ciprofloxacin Other (See Comments)    Reaction:  Unknown   . Inhaler Decongestant [Methamphetamine] Other (See Comments)    Reaction:  Unknown   . Molds & Smuts Other (See Comments)    Reaction:  Wheezing/itchy,watery eyes     Patient Measurements: Height: 5\' 8"  (172.7 cm) Weight: 163 lb (73.936 kg) IBW/kg (Calculated) : 63.9   Vital Signs: Temp: 98.1 F (36.7 C) (02/09 0505) Temp Source: Oral (02/09 0505) BP: 114/56 mmHg (02/09 0505) Pulse Rate: 97 (02/09 0505) Intake/Output from previous day: 02/08 0701 - 02/09 0700 In: 360 [P.O.:360] Out: -  Intake/Output from this shift:    Labs:  Recent Labs  07/24/15 0643 07/25/15 0748  CREATININE 0.36* 0.54  MG  --  2.0  PHOS  --  2.9   Estimated Creatinine Clearance: 55.6 mL/min (by C-G formula based on Cr of 0.54).   Microbiology: No results found for this or any previous visit (from the past 720 hour(s)).   Assessment: 82 yo female with hypokalemia this admission despite supplementation. MD requesting supplementation to be PO only (IV issues).    Goal of Therapy:  K 3.5-5.0 Mg WNL  Plan:  K=4.4 Patient was on 10 MEQ BID of KCL at home. However pt lasix has been discontinued. Per Dr. Bridgett Larsson discontinue K supplementation since K now in range.  Will obtain follow-up BMP with am labs if pt is still here.  Pharmacy will continue to monitor and adjust per consult.   Ramond Dial, Pharm.D Clinical Pharmacist 07/26/2015,7:54 AM

## 2015-07-26 NOTE — Care Management Important Message (Signed)
Important Message  Patient Details  Name: STACYE NEALL MRN: [id number] Date of Birth: 1931-10-23   Medicare Important Message Given:  Yes    Juliann Pulse A Pascha Fogal 07/26/2015, 10:02 AM

## 2015-07-26 NOTE — Care Management (Signed)
Discussed home Health agencies with Mr. Woolbert. [facility]. Will update Floydene Flock, [facility] representative. Husband will transport. Shelbie Ammons RN MSN CCM Care Management [phone]

## 2015-07-26 NOTE — Progress Notes (Signed)
[facility]  ROUNDING NOTE   Subjective:  Na up to 132.  Has tolerated tolvaptan well.     Objective:  Vital signs in last 24 hours:  Temp:  [97.7 F (36.5 C)-98.8 F (37.1 C)] 98.1 F (36.7 C) (02/09 0505) Pulse Rate:  [97-125] 125 (02/09 0843) Resp:  [18-20] 18 (02/09 0841) BP: (114-147)/(56-76) 141/59 mmHg (02/09 0841) SpO2:  [89 %-98 %] 94 % (02/09 0843) Weight:  [73.936 kg (163 lb)] 73.936 kg (163 lb) (02/09 0500)  Weight change: -0.816 kg (-1 lb 12.8 oz) Filed Weights   07/24/15 0500 07/24/15 2023 07/26/15 0500  Weight: 79.788 kg (175 lb 14.4 oz) 74.753 kg (164 lb 12.8 oz) 73.936 kg (163 lb)    Intake/Output: I/O last 3 completed shifts: In: 360 [P.O.:360] Out: -    Intake/Output this shift:  Total I/O In: 240 [P.O.:240] Out: -   Physical Exam: General: NAD  Head: Normocephalic, atraumatic. Moist oral mucosal membranes  Eyes: Anicteric  Neck: Supple, trachea midline  Lungs:  Scattered rhonchi, normal effort  Heart: Regular rate and rhythm  Abdomen:  Soft, nontender, BS present  Extremities: trace peripheral edema.  Neurologic: Nonfocal, moving all four extremities  Skin: No lesions       Basic Metabolic Panel:  Recent Labs Lab 07/21/15 1652 07/22/15 0607 07/23/15 0455 07/24/15 [id number]  07/25/15 0240 07/25/15 0748 07/25/15 1409 07/25/15 2044 07/26/15 0450  NA 112* 120* 122* 123*  < > 129* 128* 129* 130* 132*  K 2.9* 3.1* 3.0* 3.5  --   --  4.5  --   --  4.4  CL 72* 82* 87* 87*  --   --  94*  --   --   --   CO2 26 27 22 25   --   --  27  --   --   --   GLUCOSE 161* 103* 45* 51*  --   --  219*  --   --   --   BUN 9 7 7 7   --   --  12  --   --   --   CREATININE 0.40* 0.36* 0.30* 0.36*  --   --  0.54  --   --   --   CALCIUM 9.7 9.2 9.2 9.4  --   --  9.4  --   --   --   MG  --   --  1.7  --   --   --  2.0  --   --   --   PHOS  --   --   --   --   --   --  2.9  --   --   --   < > = values in this interval not displayed.  Liver  Function Tests:  Recent Labs Lab 07/21/15 1652 07/22/15 0607  AST 22 20  ALT 16 13*  ALKPHOS 126 114  BILITOT 1.0 0.5  PROT 7.7 7.2  ALBUMIN 4.0 3.9   No results for input(s): LIPASE, AMYLASE in the last 168 hours. No results for input(s): AMMONIA in the last 168 hours.  CBC:  Recent Labs Lab 07/19/15 1339 07/21/15 1652 07/22/15 0607  WBC 10.9 9.3 8.9  NEUTROABS 7.8*  --   --   HGB 11.3* 11.6* 11.3*  HCT 34.2* 35.0 33.3*  MCV 78.1* 77.6* 76.3*  PLT 495* 524* 540*    Cardiac Enzymes:  Recent Labs Lab 07/21/15 1652  TROPONINI 0.03    BNP:  Invalid input(s): POCBNP  CBG:  Recent Labs Lab 07/25/15 0748 07/25/15 1133 07/25/15 1609 07/25/15 2127 07/26/15 0738  GLUCAP 223* 277* 206* 207* 189*    Microbiology: Results for orders placed or performed in visit on 04/17/15  Urine Culture     Status: None   Collection Time: 04/17/15 12:00 AM  Result Value Ref Range Status   Urine Culture, Routine Final report  Final   Urine Culture result 1 Comment  Final    Comment: Mixed urogenital flora Less than 10,000 colonies/mL     Coagulation Studies: No results for input(s): LABPROT, INR in the last 72 hours.  Urinalysis: No results for input(s): COLORURINE, LABSPEC, PHURINE, GLUCOSEU, HGBUR, BILIRUBINUR, KETONESUR, PROTEINUR, UROBILINOGEN, NITRITE, LEUKOCYTESUR in the last 72 hours.  Invalid input(s): APPERANCEUR    Imaging: Dg Chest 2 View  07/25/2015  CLINICAL DATA:  Shortness of breath. EXAM: CHEST  2 VIEW COMPARISON:  CT of the chest 07/23/2015 FINDINGS: Cardiomediastinal silhouette is normal. Mediastinal contours appear intact. There is no evidence of focal airspace consolidation, pleural effusion or pneumothorax. There are low lung volumes with moderate bilateral emphysematous changes. Linear opacities in left more than right lung base likely represent atelectasis. Osseous structures are without acute abnormality. Soft tissues are grossly normal.  IMPRESSION: Low lung volumes with moderate emphysema. Bilateral lower lobe atelectatic changes. Electronically Signed   By: Fidela Salisbury M.D.   On: 07/25/2015 10:26     Medications:     . aspirin EC  81 mg Oral QHS  . citalopram  20 mg Oral QHS  . cloNIDine  0.1 mg Oral BID  . docusate sodium  100 mg Oral BID  . enoxaparin (LOVENOX) injection  40 mg Subcutaneous Q24H  . gabapentin  300 mg Oral Q0600  . gabapentin  600 mg Oral QHS  . gemfibrozil  600 mg Oral BID AC  . haloperidol lactate  2 mg Intramuscular Once  . insulin aspart  0-9 Units Subcutaneous TID WC  . insulin glargine  10 Units Subcutaneous QHS  . loratadine  10 mg Oral Daily  . meloxicam  15 mg Oral Daily  . montelukast  10 mg Oral QHS  . nicotine  14 mg Transdermal Daily  . nystatin  5 mL Oral QID  . polyethylene glycol  17 g Oral BID  . pravastatin  20 mg Oral q1800  . sodium chloride  1 g Oral BID  . theophylline  400 mg Oral Daily  . tiotropium  18 mcg Inhalation Daily  . tolvaptan  15 mg Oral Q24H   acetaminophen, albuterol, cyclobenzaprine, diazepam, HYDROcodone-acetaminophen, magnesium citrate, magnesium hydroxide, meclizine, ondansetron **OR** [DISCONTINUED] ondansetron (ZOFRAN) IV  Assessment/ Plan:  82 y.o. female with a PMHx of diabetes mellitus, osteoarthritis, asthma,COPD, depression, hyperlipidemia, hypertension, who was admitted to [facility] on 07/21/2015 for evaluation of constipation.  1. Hyponatremia in the setting of constipation, poor po intake, ? Excessive water ingesttion to relieve constipation. Patient also was taking lasix.  2. Constipation. 3. Anemia unspecified. SPEP/UPEP negative.   Plan:  Patient has responded to tolvaptan quite well.  Na up to 132 at the moment.  Pt will need water restriction of 1200cc per day, this was explained to pt and her husband.  We will plan to see the patient back in the office in 1-2 weeks.  We may need to add salt tabs as an outpt as well.    LOS:  5 Orean Giarratano 2/9/201710:52 AM

## 2015-07-26 NOTE — Progress Notes (Signed)
Pt discharged home with home health. Discharge instructions given and explained to spouse and pt. Spouse verbalized understanding. Follow up appointments and medications reviewed with spouse . Escorted on a wheelchair.

## 2015-07-26 NOTE — Discharge Summary (Signed)
[facility] [place] NAME: Jocelyn Burgess    MR#:  [id number]  DATE OF BIRTH:  [birth year]  DATE OF ADMISSION:  07/21/2015 (age 82) ADMITTING PHYSICIAN: Idelle Crouch, MD  DATE OF DISCHARGE: 07/26/2015 (age 82)  1:16 PM  PRIMARY CARE PHYSICIAN: Bobetta Lime, MD    ADMISSION DIAGNOSIS:  Polydipsia [R63.1] Acute hyponatremia [E87.1] Hypokalemia [E87.6] Abdominal pain, unspecified abdominal location [R10.9]   DISCHARGE DIAGNOSIS:  Acute on chronic hyponatremia and due to hypovolemic hyponatremia  hypokalemia severe constipation  Acute metabolic encephalopathy SECONDARY DIAGNOSIS:   Past Medical History  Diagnosis Date  . Allergy   . Cancer ([place])   . Diabetes mellitus without complication ([place])   . Arthritis   . Asthma   . Cataract   . COPD (chronic obstructive pulmonary disease) ([place])   . Depression     sometimes  . Hyperlipidemia   . Hypertension   . Oxygen deficiency   . Major depressive disorder, recurrent, in full remission with anxious distress ([place]) 11/23/2014    HOSPITAL COURSE:   #1. Acute on chronic hyponatremia and due to hypovolemic hyponatremia. Improving with IV hydration. Na was up to 129 after one dose of Samsca. Per Dr. Holley Raring, given one more dose of Samsca yesterday. Sodium increased to 132 this morning. Discontinued HCTZ and lasix.   CT of the chest: 5 mm nodule at the lateral right lung base, unchanged since 2008, Benign. Moderate emphysematous changes.  #2 hypokalemia; improved with potassium supplementation. #3 severe constipation use stool softeners. CT of abdomen did not show any acute bowel obstruction. Continue Dulcolax, Colace, MiraLAX. Had BM.  #4 chronic respiratory failure: Due to COPD: Continue oxygen, Spiriva, Singulair, albuterol. Smoking cessation was counseled for 3-4 minutes. Continue theophylline also.  #5 essential hypertension: #6 hyperlipidemia continue pravastatin. #7.Diabetes mellitus II.  Was on sliding scale, contiue Amaryl and metformin after discharge.  #8 Altered mental status: Possibly metabolic encephalopathy from hyponatremia hypokalemia. According to husband she is going downhill may be starting to have early dementia also. Improved.  Continue PT.  I discussed with Dr. Holley Raring. Patient may be discharged home today and follow-up with him as outpatient per Dr. Holley Raring.  DISCHARGE CONDITIONS:  Stable, discharged to home with home health and PT today.  CONSULTS OBTAINED:  Treatment Team:  Anthonette Legato, MD Demetrios Loll, MD  DRUG ALLERGIES:   Allergies  Allergen Reactions  . Ciprofloxacin Other (See Comments)    Reaction:  Unknown   . Inhaler Decongestant [Methamphetamine] Other (See Comments)    Reaction:  Unknown   . Molds & Smuts Other (See Comments)    Reaction:  Wheezing/itchy,watery eyes     DISCHARGE MEDICATIONS:   Discharge Medication List as of 07/26/2015 11:12 AM    START taking these medications   Details  bisacodyl (BISACODYL) 5 MG EC tablet Take 1 tablet (5 mg total) by mouth daily as needed for moderate constipation., Starting 07/26/2015, Until Discontinued, Print    docusate sodium (COLACE) 100 MG capsule Take 1 capsule (100 mg total) by mouth 2 (two) times daily., Starting 07/26/2015, Until Discontinued, Print      CONTINUE these medications which have NOT CHANGED   Details  albuterol (PROVENTIL HFA;VENTOLIN HFA) 108 (90 Base) MCG/ACT inhaler Inhale 2 puffs into the lungs every 4 (four) hours as needed for wheezing or shortness of breath. , Until Discontinued, Historical Med    aspirin EC 81 MG tablet Take 81 mg by mouth at bedtime., Until Discontinued, Historical Med  citalopram (CELEXA) 20 MG tablet Take 20 mg by mouth at bedtime. , Until Discontinued, Historical Med    cloNIDine (CATAPRES) 0.1 MG tablet Take 0.1 mg by mouth 2 (two) times daily., Until Discontinued, Historical Med    cyclobenzaprine (FLEXERIL) 5 MG tablet Take 1 tablet (5  mg total) by mouth 3 (three) times daily as needed for muscle spasms., Starting 07/13/2015, Until Discontinued, Normal    diazepam (VALIUM) 5 MG tablet Take 1 tablet (5 mg total) by mouth every 8 (eight) hours as needed for anxiety., Starting 07/09/2015, Until Discontinued, Print    furosemide (LASIX) 20 MG tablet Take 20 mg by mouth every morning. , Until Discontinued, Historical Med    gabapentin (NEURONTIN) 300 MG capsule Take 300-600 mg by mouth See admin instructions. Take 1 capsule by mouth in the morning and take 2 capsules (600mg ) by mouth every night at bedtime., Until Discontinued, Historical Med    gemfibrozil (LOPID) 600 MG tablet Take 600 mg by mouth 2 (two) times daily before a meal., Until Discontinued, Historical Med    glimepiride (AMARYL) 4 MG tablet Take 4 mg by mouth 2 (two) times daily. , Until Discontinued, Historical Med    hydrochlorothiazide (HYDRODIURIL) 25 MG tablet Take 25 mg by mouth daily., Until Discontinued, Historical Med    HYDROcodone-acetaminophen (NORCO) 5-325 MG tablet Take 1 tablet by mouth every 12 (twelve) hours as needed for moderate pain., Starting 07/09/2015, Until Discontinued, Print    loratadine (CLARITIN) 10 MG tablet Take 10 mg by mouth daily. , Until Discontinued, Historical Med    lovastatin (MEVACOR) 20 MG tablet Take 20 mg by mouth at bedtime., Until Discontinued, Historical Med    magnesium hydroxide (MILK OF MAGNESIA) 400 MG/5ML suspension Take 30 mLs by mouth daily as needed for mild constipation. , Until Discontinued, Historical Med    meclizine (ANTIVERT) 25 MG tablet Take 25 mg by mouth every 8 (eight) hours as needed for dizziness., Until Discontinued, Historical Med    meloxicam (MOBIC) 15 MG tablet Take 1 tablet (15 mg total) by mouth daily., Starting 05/01/2015, Until Discontinued, Normal    metFORMIN (GLUCOPHAGE) 1000 MG tablet Take 1 tablet (1,000 mg total) by mouth 2 (two) times daily with a meal., Starting 07/03/2015, Until  Discontinued, Normal    mometasone (ELOCON) 0.1 % lotion Apply 1 application topically as needed (for dry,itchy skin). Pt applies to both ears as needed., Until Discontinued, Historical Med    montelukast (SINGULAIR) 10 MG tablet Take 10 mg by mouth at bedtime., Until Discontinued, Historical Med    nystatin (MYCOSTATIN) 100000 UNIT/ML suspension Take 5 mLs (500,000 Units total) by mouth 4 (four) times daily. Swish retain in mouth long as possible then swallow, Starting 05/23/2015, Until Discontinued, Normal    potassium chloride (K-DUR) 10 MEQ tablet Take 1 tablet (10 mEq total) by mouth 2 (two) times daily., Starting 07/20/2015, Until Wed 07/25/15, Normal    SPIRIVA HANDIHALER 18 MCG inhalation capsule INHALE CONTENTS OF 1 CAPSULE ONCE DAILY, Normal    theophylline (UNIPHYL) 400 MG 24 hr tablet Take 400 mg by mouth daily. , Until Discontinued, Historical Med         DISCHARGE INSTRUCTIONS:    If you experience worsening of your admission symptoms, develop shortness of breath, life threatening emergency, suicidal or homicidal thoughts you must seek medical attention immediately by calling 911 or calling your MD immediately  if symptoms less severe.  You Must read complete instructions/literature along with all the possible adverse reactions/side effects  for all the Medicines you take and that have been prescribed to you. Take any new Medicines after you have completely understood and accept all the possible adverse reactions/side effects.   Please note  You were cared for by a hospitalist during your hospital stay. If you have any questions about your discharge medications or the care you received while you were in the hospital after you are discharged, you can call the unit and asked to speak with the hospitalist on call if the hospitalist that took care of you is not available. Once you are discharged, your primary care physician will handle any further medical issues. Please note that NO  REFILLS for any discharge medications will be authorized once you are discharged, as it is imperative that you return to your primary care physician (or establish a relationship with a primary care physician if you do not have one) for your aftercare needs so that they can reassess your need for medications and monitor your lab values.    Today   SUBJECTIVE   This but feels better. Sodium increased to 132 this am.   VITAL SIGNS:  Blood pressure 141/59, pulse 110, temperature 98.1 F (36.7 C), temperature source Oral, resp. rate 18, height 5\' 8"  (1.727 m), weight 73.936 kg (163 lb), SpO2 94 %.  I/O:   Intake/Output Summary (Last 24 hours) at 07/26/15 1806 Last data filed at 07/26/15 0900  Gross per 24 hour  Intake    480 ml  Output      0 ml  Net    480 ml    PHYSICAL EXAMINATION:  GENERAL:  82 y.o.-year-old patient lying in the bed with no acute distress.  EYES: Pupils equal, round, reactive to light and accommodation. No scleral icterus. Extraocular muscles intact.  HEENT: Head atraumatic, normocephalic. Oropharynx and nasopharynx clear.  NECK:  Supple, no jugular venous distention. No thyroid enlargement, no tenderness.  LUNGS: Diminished breath sounds bilaterally, no wheezing, rales,rhonchi or crepitation. No use of accessory muscles of respiration.  CARDIOVASCULAR: S1, S2 normal. No murmurs, rubs, or gallops.  ABDOMEN: Soft, non-tender, non-distended. Bowel sounds present. No organomegaly or mass.  EXTREMITIES: No pedal edema, cyanosis, or clubbing.  NEUROLOGIC: Cranial nerves II through XII are intact. Muscle strength 4/5 in all extremities. Sensation intact. Gait not checked.  PSYCHIATRIC: The patient is alert and oriented x 3.  SKIN: No obvious rash, lesion, or ulcer.   DATA REVIEW:   CBC  Recent Labs Lab 07/22/15 0607  WBC 8.9  HGB 11.3*  HCT 33.3*  PLT 540*    Chemistries   Recent Labs Lab 07/22/15 0607  07/25/15 0748  07/26/15 0450 07/26/15 1047   NA 120*  < > 128*  < > 132* 130*  K 3.1*  < > 4.5  --  4.4  --   CL 82*  < > 94*  --   --   --   CO2 27  < > 27  --   --   --   GLUCOSE 103*  < > 219*  --   --   --   BUN 7  < > 12  --   --   --   CREATININE 0.36*  < > 0.54  --   --   --   CALCIUM 9.2  < > 9.4  --   --   --   MG  --   < > 2.0  --   --   --   AST  20  --   --   --   --   --   ALT 13*  --   --   --   --   --   ALKPHOS 114  --   --   --   --   --   BILITOT 0.5  --   --   --   --   --   < > = values in this interval not displayed.  Cardiac Enzymes  Recent Labs Lab 07/21/15 1652  TROPONINI 0.03    Microbiology Results  Results for orders placed or performed in visit on 04/17/15  Urine Culture     Status: None   Collection Time: 04/17/15 12:00 AM  Result Value Ref Range Status   Urine Culture, Routine Final report  Final   Urine Culture result 1 Comment  Final    Comment: Mixed urogenital flora Less than 10,000 colonies/mL     RADIOLOGY:  Dg Chest 2 View  07/25/2015  CLINICAL DATA:  Shortness of breath. EXAM: CHEST  2 VIEW COMPARISON:  CT of the chest 07/23/2015 FINDINGS: Cardiomediastinal silhouette is normal. Mediastinal contours appear intact. There is no evidence of focal airspace consolidation, pleural effusion or pneumothorax. There are low lung volumes with moderate bilateral emphysematous changes. Linear opacities in left more than right lung base likely represent atelectasis. Osseous structures are without acute abnormality. Soft tissues are grossly normal. IMPRESSION: Low lung volumes with moderate emphysema. Bilateral lower lobe atelectatic changes. Electronically Signed   By: Fidela Salisbury M.D.   On: 07/25/2015 10:26      I discussed with Dr. Holley Raring.  Management plans discussed with the patient, her husband and they are in agreement.  CODE STATUS:  Code Status History    Date Active Date Inactive Code Status Order ID Comments User Context   07/21/2015 10:05 PM 07/26/2015  4:22 PM Full Code  [id number]  Idelle Crouch, MD Inpatient      TOTAL TIME TAKING CARE OF THIS PATIENT:37 minutes.    Demetrios Loll M.D on 07/26/2015 at 6:06 PM  Between 7am to 6pm - Pager - [phone]  After 6pm go to [email or web address] - password [email or web address]  Office  [phone]  CC: Primary care physician; Bobetta Lime, MD

## 2015-07-30 ENCOUNTER — Other Ambulatory Visit: Payer: Self-pay

## 2015-07-30 MED ORDER — FUROSEMIDE 20 MG PO TABS: 20.0000 mg | ORAL_TABLET | ORAL | Status: DC

## 2015-08-07 ENCOUNTER — Ambulatory Visit (INDEPENDENT_AMBULATORY_CARE_PROVIDER_SITE_OTHER): Payer: Medicare Other | Admitting: Family Medicine

## 2015-08-07 ENCOUNTER — Encounter: Payer: Self-pay | Admitting: Family Medicine

## 2015-08-07 VITALS — BP 132/60 | HR 117 | Temp 98.4°F | Resp 16 | Ht 68.0 in | Wt 164.8 lb

## 2015-08-07 DIAGNOSIS — Z9181 History of falling: Secondary | ICD-10-CM | POA: Diagnosis not present

## 2015-08-07 DIAGNOSIS — J449 Chronic obstructive pulmonary disease, unspecified: Secondary | ICD-10-CM

## 2015-08-07 DIAGNOSIS — S2232XD Fracture of one rib, left side, subsequent encounter for fracture with routine healing: Secondary | ICD-10-CM | POA: Insufficient documentation

## 2015-08-07 DIAGNOSIS — R0609 Other forms of dyspnea: Secondary | ICD-10-CM

## 2015-08-07 DIAGNOSIS — M8008XA Age-related osteoporosis with current pathological fracture, vertebra(e), initial encounter for fracture: Secondary | ICD-10-CM

## 2015-08-07 DIAGNOSIS — S2231XS Fracture of one rib, right side, sequela: Secondary | ICD-10-CM

## 2015-08-07 DIAGNOSIS — M8088XS Other osteoporosis with current pathological fracture, vertebra(e), sequela: Secondary | ICD-10-CM

## 2015-08-07 DIAGNOSIS — M8008XS Age-related osteoporosis with current pathological fracture, vertebra(e), sequela: Secondary | ICD-10-CM

## 2015-08-07 DIAGNOSIS — I10 Essential (primary) hypertension: Secondary | ICD-10-CM | POA: Diagnosis not present

## 2015-08-07 DIAGNOSIS — K5901 Slow transit constipation: Secondary | ICD-10-CM | POA: Diagnosis not present

## 2015-08-07 HISTORY — DX: Age-related osteoporosis with current pathological fracture, vertebra(e), initial encounter for fracture: M80.08XA

## 2015-08-07 NOTE — Progress Notes (Signed)
Name: Jocelyn Burgess   MRN: [id number]    DOB: 23-Dec-1931   Date:08/07/2015       Progress Note  Subjective  Chief Complaint  Chief Complaint  Patient presents with  . Hospitalization Follow-up    Went to ER on 2/4 due to fall and discharged on 2/9 diagnosed with hyponatremia and constipation    HPI  Jocelyn Burgess is a 82 year old female with COPD moderate-severe oxygen dependent at night and occasionally during daytime, recently quit smoking cigarettes, hx breast cancer, vertigo, DM II, Arthritis, Depression, HTN, HLD. Today she is here for follow up after being admitted to the hospital for fall and low sodium.  Improved symptoms in general feeling back to baseline. Using miralax, bisacodyl, docusate sodium, linzess for constipation which led to electrolyte abnormality and mental status change. Had f/u with Nephrologist yesterday and HCTZ discontinued. She has also stopped smoking cigarettes by using a nicotine patch.   Requesting wheelchair RX as she is not able to walk more than a few feet without dyspnea due to poor breathing.  Past Medical History  Diagnosis Date  . Allergy   . Cancer ([place])   . Diabetes mellitus without complication ([place])   . Arthritis   . Asthma   . Cataract   . COPD (chronic obstructive pulmonary disease) ([place])   . Depression     sometimes  . Hyperlipidemia   . Hypertension   . Oxygen deficiency   . Major depressive disorder, recurrent, in full remission with anxious distress ([place]) 11/23/2014    Patient Active Problem List   Diagnosis Date Noted  . AP (abdominal pain)   . Abdominal pain 07/21/2015  . Acute hyponatremia 07/21/2015  . Hypokalemia 07/21/2015  . Left hip pain 07/09/2015  . Benign essential tremor 07/09/2015  . Venous stasis dermatitis of both lower extremities 07/03/2015  . Bilateral edema of lower extremity 05/30/2015  . Bleeding nose 05/08/2015  . Morning headache 05/01/2015  . Intermittent confusion 04/17/2015  . Cough  04/17/2015  . Constipation, slow transit 04/09/2015  . Allergic rhinitis with postnasal drip 04/09/2015  . Hypertension goal BP (blood pressure) < 140/90 01/22/2015  . Major depressive disorder, recurrent, in full remission with anxious distress ([place]) 11/23/2014  . Bronchitis with chronic airway obstruction ([place]) 11/23/2014  . Heavy cigarette smoker 11/23/2014  . COPD, severe ([place]) 11/23/2014  . DD (diverticular disease) 11/23/2014  . Personal history of malignant neoplasm of breast 11/23/2014  . History of digestive disease 11/23/2014  . Personal history of healed traumatic fracture 11/23/2014  . HLD (hyperlipidemia) 11/23/2014  . Peripheral neuropathic pain ([place]) 11/23/2014  . At risk for falling 11/23/2014  . Diabetes mellitus type 2, controlled, with complications ([place]) [id number]    Social History  Substance Use Topics  . Smoking status: Current Every Day Smoker -- 1.00 packs/day for 45 years    Types: Cigarettes  . Smokeless tobacco: Current User  . Alcohol Use: No     Current outpatient prescriptions:  .  albuterol (PROVENTIL HFA;VENTOLIN HFA) 108 (90 Base) MCG/ACT inhaler, Inhale 2 puffs into the lungs every 4 (four) hours as needed for wheezing or shortness of breath. , Disp: , Rfl:  .  aspirin EC 81 MG tablet, Take 81 mg by mouth at bedtime., Disp: , Rfl:  .  bisacodyl (BISACODYL) 5 MG EC tablet, Take 1 tablet (5 mg total) by mouth daily as needed for moderate constipation., Disp: 30 tablet, Rfl: 0 .  citalopram (CELEXA) 20 MG tablet,  Take 20 mg by mouth at bedtime. , Disp: , Rfl:  .  cloNIDine (CATAPRES) 0.1 MG tablet, Take 0.1 mg by mouth 2 (two) times daily., Disp: , Rfl:  .  cyclobenzaprine (FLEXERIL) 5 MG tablet, Take 1 tablet (5 mg total) by mouth 3 (three) times daily as needed for muscle spasms., Disp: 30 tablet, Rfl: 1 .  diazepam (VALIUM) 5 MG tablet, Take 1 tablet (5 mg total) by mouth every 8 (eight) hours as needed for anxiety., Disp: 270 tablet, Rfl: 1 .   docusate sodium (COLACE) 100 MG capsule, Take 1 capsule (100 mg total) by mouth 2 (two) times daily., Disp: 10 capsule, Rfl: 0 .  furosemide (LASIX) 20 MG tablet, Take 1 tablet (20 mg total) by mouth every morning., Disp: 90 tablet, Rfl: 1 .  gabapentin (NEURONTIN) 300 MG capsule, Take 300-600 mg by mouth See admin instructions. Take 1 capsule by mouth in the morning and take 2 capsules (600mg ) by mouth every night at bedtime., Disp: , Rfl:  .  gemfibrozil (LOPID) 600 MG tablet, Take 600 mg by mouth 2 (two) times daily before a meal., Disp: , Rfl:  .  glimepiride (AMARYL) 4 MG tablet, Take 4 mg by mouth 2 (two) times daily. , Disp: , Rfl:  .  hydrochlorothiazide (HYDRODIURIL) 25 MG tablet, Take 25 mg by mouth daily., Disp: , Rfl:  .  HYDROcodone-acetaminophen (NORCO) 5-325 MG tablet, Take 1 tablet by mouth every 12 (twelve) hours as needed for moderate pain., Disp: 60 tablet, Rfl: 0 .  LINZESS 145 MCG CAPS capsule, , Disp: , Rfl:  .  loratadine (CLARITIN) 10 MG tablet, Take 10 mg by mouth daily. , Disp: , Rfl:  .  lovastatin (MEVACOR) 20 MG tablet, Take 20 mg by mouth at bedtime., Disp: , Rfl:  .  magnesium hydroxide (MILK OF MAGNESIA) 400 MG/5ML suspension, Take 30 mLs by mouth daily as needed for mild constipation. , Disp: , Rfl:  .  meclizine (ANTIVERT) 25 MG tablet, Take 25 mg by mouth every 8 (eight) hours as needed for dizziness., Disp: , Rfl:  .  meloxicam (MOBIC) 15 MG tablet, Take 1 tablet (15 mg total) by mouth daily., Disp: 90 tablet, Rfl: 2 .  metFORMIN (GLUCOPHAGE) 1000 MG tablet, Take 1 tablet (1,000 mg total) by mouth 2 (two) times daily with a meal., Disp: 180 tablet, Rfl: 1 .  mometasone (ELOCON) 0.1 % lotion, Apply 1 application topically as needed (for dry,itchy skin). Pt applies to both ears as needed., Disp: , Rfl:  .  montelukast (SINGULAIR) 10 MG tablet, Take 10 mg by mouth at bedtime., Disp: , Rfl:  .  nystatin (MYCOSTATIN) 100000 UNIT/ML suspension, Take 5 mLs (500,000 Units  total) by mouth 4 (four) times daily. Swish retain in mouth long as possible then swallow, Disp: 120 mL, Rfl: 0 .  potassium chloride (K-DUR) 10 MEQ tablet, Take 1 tablet (10 mEq total) by mouth 2 (two) times daily., Disp: 60 tablet, Rfl: 1 .  SPIRIVA HANDIHALER 18 MCG inhalation capsule, INHALE CONTENTS OF 1 CAPSULE ONCE DAILY, Disp: 90 capsule, Rfl: 1 .  theophylline (UNIPHYL) 400 MG 24 hr tablet, Take 400 mg by mouth daily. , Disp: , Rfl:   Past Surgical History  Procedure Laterality Date  . Breast surgery Left     Family History  Problem Relation Age of Onset  . Arthritis Mother   . Arthritis Father   . Cancer Sister     brain  . Heart disease  Sister   . Stroke Sister   . Diabetes Brother     Allergies  Allergen Reactions  . Ciprofloxacin Other (See Comments)    Reaction:  Unknown   . Inhaler Decongestant [Methamphetamine] Other (See Comments)    Reaction:  Unknown   . Molds & Smuts Other (See Comments)    Reaction:  Wheezing/itchy,watery eyes      Review of Systems  CONSTITUTIONAL: No significant weight changes, fever, chills, weakness or fatigue.  HEENT:  - Eyes: No visual changes.  - Ears: No auditory changes. No pain.  - Nose: No sneezing, congestion, runny nose. - Throat: No sore throat. No changes in swallowing. SKIN: No rash or itching.  CARDIOVASCULAR: No chest pain, chest pressure or chest discomfort. No palpitations or edema.  RESPIRATORY: Yes shortness of breath with activity, No cough or sputum.  GASTROINTESTINAL: No anorexia, nausea, vomiting. No changes in bowel habits. No abdominal pain or blood.  GENITOURINARY: No dysuria. No frequency. No discharge. NEUROLOGICAL: No headache, dizziness, syncope, paralysis, ataxia, numbness or tingling in the extremities. No memory changes. No change in bowel or bladder control.  MUSCULOSKELETAL: Yes joint pain. No muscle pain. HEMATOLOGIC: No anemia, bleeding or bruising.  LYMPHATICS: No enlarged lymph nodes.   PSYCHIATRIC: No change in mood. No change in sleep pattern.  ENDOCRINOLOGIC: No reports of sweating, cold or heat intolerance. No polyuria or polydipsia.     Objective  BP 132/60 mmHg  Pulse 117  Temp(Src) 98.4 F (36.9 C) (Oral)  Resp 16  Ht 5\' 8"  (1.727 m)  Wt 164 lb 12.8 oz (74.753 kg)  BMI 25.06 kg/m2  SpO2 91% Body mass index is 25.06 kg/(m^2).  Physical Exam  Constitutional: Patient appears well-developed and well-nourished. In no acute distress. Cardiovascular: Normal rate, regular rhythm and 2/6 SEM best heart at right of sternum.  Pulmonary/Chest: Effort normal with baseline reduced tidal volume. No respiratory distress. Musculoskeletal: Normal range of motion bilateral UE and LE, no joint effusions. Amble to stand up and bear weight on both legs alternating with assistance for balance. Left hip and upper femur area no bruising on exam. Full ROM of left hip w/o worsening pain. Strength bilateral LE 5/5, strength bilateral UE 5/5.  Peripheral vascular: Bilateral LE resolved pedal edema. Some scattered red-brown hyperpigmentation on dorsum of feet without papules or scaling.  Neurological: CN II-XII grossly intact with no focal deficits. Alert and oriented to person, place, and time. Coordination, strength, speech and gait are normal but she does have resting general tremor and needs cane to walk with.  Skin: Skin is warm and dry. No rash noted. No erythema.  Psychiatric: Patient has a normal mood and affect. Behavior is normal in office today. Judgment and thought content normal in office today.      Assessment & Plan   1. Dyspnea on exertion Due to cardio-pulmonary disorders. Animal nutritionist.   2. Constipation, slow transit Continue current medication regimen as it is helping, keep well hydrated with water.  3. COPD, severe (HCC) Pulse Ox low today but improved to 94% after sitting in chair.   4. Hypertension goal BP (blood pressure) <  140/90 Well controled.   5. At risk for falling Continue to use cane. Wheel chair should help reduce fall events.   Mild degenerative changes of the visualized thoracolumbar spine on CT chest.  6. Vertebral fracture, osteoporotic, sequela CT scan showed Severe compression fracture deformity at T6, with approximately 60% loss of height, as noted on recent  chest radiograph, new from 2016. No retropulsion.   No new neurological findings, patient declined Spinal Specialist consultation.   7. Fracture, rib, right, sequela Use pain medication as needed.   Healing right lateral 6th rib fracture deformity (series 2/ image 27).

## 2015-08-08 ENCOUNTER — Telehealth: Payer: Self-pay

## 2015-08-08 DIAGNOSIS — F0390 Unspecified dementia without behavioral disturbance: Secondary | ICD-10-CM | POA: Insufficient documentation

## 2015-08-08 DIAGNOSIS — M8008XS Age-related osteoporosis with current pathological fracture, vertebra(e), sequela: Secondary | ICD-10-CM

## 2015-08-08 DIAGNOSIS — S2232XD Fracture of one rib, left side, subsequent encounter for fracture with routine healing: Secondary | ICD-10-CM

## 2015-08-08 MED ORDER — CYCLOBENZAPRINE HCL 5 MG PO TABS: 5.0000 mg | ORAL_TABLET | Freq: Three times a day (TID) | ORAL | Status: DC | PRN

## 2015-08-08 MED ORDER — DONEPEZIL HCL 5 MG PO TABS: 5.0000 mg | ORAL_TABLET | Freq: Every day | ORAL | Status: DC

## 2015-08-08 NOTE — Telephone Encounter (Signed)
Patient notified

## 2015-08-08 NOTE — Telephone Encounter (Signed)
Patient husband called wants to see about getting rx for flexeril 90 day supply, also wants to see about getting rx for dementia?

## 2015-08-08 NOTE — Telephone Encounter (Signed)
1) Flexeril can cause drowsiness so keep in mind. I understand he is trying to avoid giving her the opioid pain medication as often. I have sent #150 tablets of flexeril to [facility] as this is not a medicaiton people are on chronically in large quantities.   2) I also sent Aricept generic 5 mg one at bedtime daily to [facility] for 30 days to try out. If it suits her and it is affordable a 90 day supply can be prescribed later. I want her to try it first. There are no medications to improve dementia, just slow down progression of the disease. Keeping her cigarette smoke free and controlling her COPD and oxygenation will help.

## 2015-08-13 ENCOUNTER — Telehealth: Payer: Self-pay

## 2015-08-13 DIAGNOSIS — J449 Chronic obstructive pulmonary disease, unspecified: Secondary | ICD-10-CM

## 2015-08-13 MED ORDER — PROAIR HFA 108 (90 BASE) MCG/ACT IN AERS: 1.0000 | INHALATION_SPRAY | Freq: Four times a day (QID) | RESPIRATORY_TRACT | Status: DC | PRN

## 2015-08-13 NOTE — Telephone Encounter (Signed)
Sent RX for [employer] to pharmacy

## 2015-08-13 NOTE — Telephone Encounter (Signed)
Ins called states they will no longer cover Ventolin, but is covering Proair HFA and respi click

## 2015-08-15 ENCOUNTER — Telehealth: Payer: Self-pay

## 2015-08-15 ENCOUNTER — Encounter: Payer: Self-pay | Admitting: Family Medicine

## 2015-08-15 ENCOUNTER — Ambulatory Visit (INDEPENDENT_AMBULATORY_CARE_PROVIDER_SITE_OTHER): Payer: Medicare Other | Admitting: Family Medicine

## 2015-08-15 VITALS — BP 138/68 | HR 127 | Temp 98.0°F | Resp 18 | Ht 68.0 in | Wt 168.1 lb

## 2015-08-15 DIAGNOSIS — G250 Essential tremor: Secondary | ICD-10-CM | POA: Diagnosis not present

## 2015-08-15 DIAGNOSIS — R410 Disorientation, unspecified: Secondary | ICD-10-CM

## 2015-08-15 DIAGNOSIS — J449 Chronic obstructive pulmonary disease, unspecified: Secondary | ICD-10-CM

## 2015-08-15 DIAGNOSIS — M8088XS Other osteoporosis with current pathological fracture, vertebra(e), sequela: Secondary | ICD-10-CM

## 2015-08-15 DIAGNOSIS — I10 Essential (primary) hypertension: Secondary | ICD-10-CM

## 2015-08-15 DIAGNOSIS — F0390 Unspecified dementia without behavioral disturbance: Secondary | ICD-10-CM

## 2015-08-15 DIAGNOSIS — M8008XS Age-related osteoporosis with current pathological fracture, vertebra(e), sequela: Secondary | ICD-10-CM

## 2015-08-15 DIAGNOSIS — K5901 Slow transit constipation: Secondary | ICD-10-CM

## 2015-08-15 MED ORDER — ACETAMINOPHEN-CODEINE #4 300-60 MG PO TABS: 1.0000 | ORAL_TABLET | Freq: Three times a day (TID) | ORAL | Status: DC | PRN

## 2015-08-15 MED ORDER — ALBUTEROL SULFATE HFA 108 (90 BASE) MCG/ACT IN AERS: 1.0000 | INHALATION_SPRAY | RESPIRATORY_TRACT | Status: AC | PRN

## 2015-08-15 NOTE — Telephone Encounter (Signed)
Husband called states a new Rx for pro air was sent to pharmacy. (which I had sent you a note last week about her ins. Calling stating they would no longer cover ventolin, but would cover pro air and you changed)  He states she is allergic to pro air.  He stated last year they did cover b/c she was allergic.  Please change

## 2015-08-15 NOTE — Telephone Encounter (Signed)
Okay ordered Ventolin now. Added Albuterol to allergy lis (I can't just add ProAIR) but wrote in details that ProAir only causes palpitations and worsening anxiety.

## 2015-08-15 NOTE — Progress Notes (Signed)
Name: Jocelyn Burgess   MRN: [id number]    DOB: 02-11-1932   Date:08/15/2015       Progress Note  Subjective  Chief Complaint  Chief Complaint  Patient presents with  . Tremors  . Dementia    HPI  Jocelyn Burgess is a 82 year old female with COPD moderate-severe oxygen dependent at night and occasionally during daytime, recently quit smoking cigarettes, hx breast cancer, vertigo, DM II, Arthritis, Depression, HTN, HLD. Today she is here with her husband with changes to her mentation and worsening tremors. I just saw her on 08/07/15 and she was back to baseline since hospitalization for hyponatremia but since that visit she had started having worsening tremors and dementia.  Tremors not responding to valium any more. Having terrible nightmares and seeing people not there usual early mornings. Still using oxygen at night. Still remembers a lot from her past but current memory is fuzzy. Husband feels that her pain medication, Norco, or muscle relaxer, Flexeril, may be contributing to her fluctuating memory. Glucose still good, ranging from 80-140 at home when they check.   Jocelyn Burgess still reports pain at her vertebral fracture and rib fracture. Using Norco 5-325mg  TID with minimal relief in pain.  Constipation well controled, having BMs daily.   Past Medical History  Diagnosis Date  . Allergy   . Cancer ([place])   . Diabetes mellitus without complication ([place])   . Arthritis   . Asthma   . Cataract   . COPD (chronic obstructive pulmonary disease) ([place])   . Depression     sometimes  . Hyperlipidemia   . Hypertension   . Oxygen deficiency   . Major depressive disorder, recurrent, in full remission with anxious distress ([place]) 11/23/2014  . Vertebral fracture, osteoporotic ([place]) 08/07/2015    Patient Active Problem List   Diagnosis Date Noted  . Dementia 08/08/2015  . Dyspnea on exertion 08/07/2015  . Vertebral fracture, osteoporotic ([place]) 08/07/2015  . Fracture of rib of left side with  routine healing 08/07/2015  . AP (abdominal pain)   . Abdominal pain 07/21/2015  . Acute hyponatremia 07/21/2015  . Hypokalemia 07/21/2015  . Left hip pain 07/09/2015  . Benign essential tremor 07/09/2015  . Venous stasis dermatitis of both lower extremities 07/03/2015  . Bilateral edema of lower extremity 05/30/2015  . Bleeding nose 05/08/2015  . Morning headache 05/01/2015  . Intermittent confusion 04/17/2015  . Cough 04/17/2015  . Constipation, slow transit 04/09/2015  . Allergic rhinitis with postnasal drip 04/09/2015  . Hypertension goal BP (blood pressure) < 140/90 01/22/2015  . Major depressive disorder, recurrent, in full remission with anxious distress ([place]) 11/23/2014  . Bronchitis with chronic airway obstruction ([place]) 11/23/2014  . Heavy cigarette smoker 11/23/2014  . COPD, severe ([place]) 11/23/2014  . DD (diverticular disease) 11/23/2014  . Personal history of malignant neoplasm of breast 11/23/2014  . History of digestive disease 11/23/2014  . Personal history of healed traumatic fracture 11/23/2014  . HLD (hyperlipidemia) 11/23/2014  . Peripheral neuropathic pain ([place]) 11/23/2014  . At risk for falling 11/23/2014  . Diabetes mellitus type 2, controlled, with complications ([place]) [id number]    Social History  Substance Use Topics  . Smoking status: Current Every Day Smoker -- 1.00 packs/day for 45 years    Types: Cigarettes  . Smokeless tobacco: Current User  . Alcohol Use: No     Current outpatient prescriptions:  .  ACCU-CHEK AVIVA PLUS test strip, , Disp: , Rfl:  .  aspirin EC  81 MG tablet, Take 81 mg by mouth at bedtime., Disp: , Rfl:  .  bisacodyl (BISACODYL) 5 MG EC tablet, Take 1 tablet (5 mg total) by mouth daily as needed for moderate constipation., Disp: 30 tablet, Rfl: 0 .  citalopram (CELEXA) 20 MG tablet, Take 20 mg by mouth at bedtime. , Disp: , Rfl:  .  cloNIDine (CATAPRES) 0.1 MG tablet, Take 0.1 mg by mouth 2 (two) times daily., Disp: , Rfl:  .   cyclobenzaprine (FLEXERIL) 5 MG tablet, Take 1 tablet (5 mg total) by mouth 3 (three) times daily as needed for muscle spasms., Disp: 150 tablet, Rfl: 1 .  diazepam (VALIUM) 5 MG tablet, Take 1 tablet (5 mg total) by mouth every 8 (eight) hours as needed for anxiety., Disp: 270 tablet, Rfl: 1 .  docusate sodium (COLACE) 100 MG capsule, Take 1 capsule (100 mg total) by mouth 2 (two) times daily., Disp: 10 capsule, Rfl: 0 .  donepezil (ARICEPT) 5 MG tablet, Take 1 tablet (5 mg total) by mouth at bedtime., Disp: 30 tablet, Rfl: 1 .  fluconazole (DIFLUCAN) 150 MG tablet, , Disp: , Rfl:  .  furosemide (LASIX) 20 MG tablet, Take 1 tablet (20 mg total) by mouth every morning., Disp: 90 tablet, Rfl: 1 .  gabapentin (NEURONTIN) 300 MG capsule, Take 300-600 mg by mouth See admin instructions. Take 1 capsule by mouth in the morning and take 2 capsules (600mg ) by mouth every night at bedtime., Disp: , Rfl:  .  gemfibrozil (LOPID) 600 MG tablet, Take 600 mg by mouth 2 (two) times daily before a meal., Disp: , Rfl:  .  glimepiride (AMARYL) 4 MG tablet, Take 4 mg by mouth 2 (two) times daily. , Disp: , Rfl:  .  hydrochlorothiazide (HYDRODIURIL) 25 MG tablet, Take 25 mg by mouth daily., Disp: , Rfl: 2 .  HYDROcodone-acetaminophen (NORCO) 5-325 MG tablet, Take 1 tablet by mouth every 12 (twelve) hours as needed for moderate pain., Disp: 60 tablet, Rfl: 0 .  LINZESS 145 MCG CAPS capsule, , Disp: , Rfl:  .  loratadine (CLARITIN) 10 MG tablet, Take 10 mg by mouth daily. , Disp: , Rfl:  .  lovastatin (MEVACOR) 20 MG tablet, Take 20 mg by mouth at bedtime., Disp: , Rfl:  .  magnesium hydroxide (MILK OF MAGNESIA) 400 MG/5ML suspension, Take 30 mLs by mouth daily as needed for mild constipation. , Disp: , Rfl:  .  meclizine (ANTIVERT) 25 MG tablet, Take 25 mg by mouth every 8 (eight) hours as needed for dizziness., Disp: , Rfl:  .  meloxicam (MOBIC) 15 MG tablet, Take 1 tablet (15 mg total) by mouth daily., Disp: 90 tablet,  Rfl: 2 .  metFORMIN (GLUCOPHAGE) 1000 MG tablet, Take 1 tablet (1,000 mg total) by mouth 2 (two) times daily with a meal., Disp: 180 tablet, Rfl: 1 .  mometasone (ELOCON) 0.1 % lotion, Apply 1 application topically as needed (for dry,itchy skin). Pt applies to both ears as needed., Disp: , Rfl:  .  montelukast (SINGULAIR) 10 MG tablet, Take 10 mg by mouth at bedtime., Disp: , Rfl:  .  nystatin (MYCOSTATIN) 100000 UNIT/ML suspension, Take 5 mLs (500,000 Units total) by mouth 4 (four) times daily. Swish retain in mouth long as possible then swallow, Disp: 120 mL, Rfl: 0 .  potassium chloride (K-DUR) 10 MEQ tablet, Take 1 tablet (10 mEq total) by mouth 2 (two) times daily., Disp: 60 tablet, Rfl: 1 .  PROAIR HFA 108 (90  Base) MCG/ACT inhaler, Inhale 1-2 puffs into the lungs every 6 (six) hours as needed for wheezing or shortness of breath., Disp: 18 g, Rfl: 2 .  SPIRIVA HANDIHALER 18 MCG inhalation capsule, INHALE CONTENTS OF 1 CAPSULE ONCE DAILY, Disp: 90 capsule, Rfl: 1 .  theophylline (UNIPHYL) 400 MG 24 hr tablet, Take 400 mg by mouth daily. , Disp: , Rfl:   Past Surgical History  Procedure Laterality Date  . Breast surgery Left     Family History  Problem Relation Age of Onset  . Arthritis Mother   . Arthritis Father   . Cancer Sister     brain  . Heart disease Sister   . Stroke Sister   . Diabetes Brother     Allergies  Allergen Reactions  . Ciprofloxacin Other (See Comments)    Reaction:  Unknown   . Inhaler Decongestant [Methamphetamine] Other (See Comments)    Reaction:  Unknown   . Molds & Smuts Other (See Comments)    Reaction:  Wheezing/itchy,watery eyes      Review of Systems  CONSTITUTIONAL: No significant weight changes, fever, chills, weakness or fatigue.  HEENT:  - Eyes: No visual changes.  - Ears: No auditory changes. No pain.  - Nose: No sneezing, congestion, runny nose. - Throat: No sore throat. No changes in swallowing. SKIN: No rash or itching.   CARDIOVASCULAR: No chest pain, chest pressure. Does get chest discomfort right lower anterior rib wall intermittently which resolves with position change. No palpitations or edema.  RESPIRATORY: Stable chronic shortness of breath. No cough or sputum.  GASTROINTESTINAL: No anorexia, nausea, vomiting. No changes in bowel habits. No abdominal pain or blood.  GENITOURINARY: No dysuria. No frequency. No discharge.  NEUROLOGICAL: No headache, dizziness, syncope, paralysis, ataxia, numbness or tingling in the extremities. Yes memory changes. No change in bowel or bladder control.  MUSCULOSKELETAL: Yes joint pain. No muscle pain. HEMATOLOGIC: No anemia, bleeding or bruising.  LYMPHATICS: No enlarged lymph nodes.  PSYCHIATRIC: No change in mood. Yes change in sleep pattern.  ENDOCRINOLOGIC: No reports of sweating, cold or heat intolerance. No polyuria or polydipsia.     Objective  BP 138/68 mmHg  Pulse 127  Temp(Src) 98 F (36.7 C)  Resp 18  Ht 5\' 8"  (1.727 m)  Wt 168 lb 1 oz (76.233 kg)  BMI 25.56 kg/m2  SpO2 94% Body mass index is 25.56 kg/(m^2).  Physical Exam  Constitutional: Patient appears well-developed and well-nourished. In no acute distress. Resting tremor more prominent that usual. Using walker to walk.  Cardiovascular: Normal rate, regular rhythm and 2/6 SEM best heart at right of sternum.  Pulmonary/Chest: Effort normal with baseline reduced tidal volume. No respiratory distress. Musculoskeletal: Normal range of motion bilateral UE and LE, no joint effusions. Tenderness over T6  area of vertebral fracture. No reproducible tenderness anterior chest wall. Peripheral vascular: Bilateral LE resolved pedal edema.  Neurological: CN II-XII grossly intact with no focal deficits. Alert and oriented to person, place, and time. Coordination, strength, speech and gait are at her baseline but she does have resting general tremor and needs walker to walk with.  Skin: Skin is warm and dry.  No rash noted. No erythema.  Psychiatric: Patient has a normal mood and affect. Behavior is normal in office today. Judgment and thought content normal in office today.   Assessment & Plan  1. Dementia, without behavioral disturbance Worsening. Will get Neurology on board.  - Ambulatory referral to Neurology  2. Vertebral fracture, osteoporotic,  sequela Stop Norco, trial of Tylenol #4, if no improvement will refer to neurosurgery spine specialist to evaluate for proper healing.  - acetaminophen-codeine (TYLENOL #4) 300-60 MG tablet; Take 1 tablet by mouth every 8 (eight) hours as needed for moderate pain.  Dispense: 90 tablet; Refill: 0  3. Intermittent confusion Will check electrolytes again, also consider UTI in elderly as causative for acute mental status change. Patient unable to provide urine sample.  - CBC with Differential/Platelet - Comprehensive metabolic panel - Ambulatory referral to Neurology  4. Hypertension goal BP (blood pressure) < 140/90 Stable.  - CBC with Differential/Platelet - Comprehensive metabolic panel  5. COPD, severe ([place]) Stable.  6. Constipation, slow transit Stable.  7. Benign essential tremor Progressively worsening.  - CBC with Differential/Platelet - Comprehensive metabolic panel - Ambulatory referral to Neurology

## 2015-08-16 ENCOUNTER — Telehealth: Payer: Self-pay | Admitting: Family Medicine

## 2015-08-16 LAB — CBC WITH DIFFERENTIAL/PLATELET
BASOS: 0 %
Basophils Absolute: 0 10*3/uL (ref 0.0–0.2)
EOS (ABSOLUTE): 0.1 10*3/uL (ref 0.0–0.4)
EOS: 1 %
HEMATOCRIT: 32.8 % — AB (ref 34.0–46.6)
HEMOGLOBIN: 10.6 g/dL — AB (ref 11.1–15.9)
Immature Grans (Abs): 0 10*3/uL (ref 0.0–0.1)
Immature Granulocytes: 0 %
LYMPHS ABS: 2.4 10*3/uL (ref 0.7–3.1)
Lymphs: 32 %
MCH: 25.6 pg — AB (ref 26.6–33.0)
MCHC: 32.3 g/dL (ref 31.5–35.7)
MCV: 79 fL (ref 79–97)
MONOCYTES: 8 %
Monocytes Absolute: 0.6 10*3/uL (ref 0.1–0.9)
NEUTROS ABS: 4.3 10*3/uL (ref 1.4–7.0)
Neutrophils: 59 %
Platelets: 455 10*3/uL — ABNORMAL HIGH (ref 150–379)
RBC: 4.14 x10E6/uL (ref 3.77–5.28)
RDW: 16.5 % — ABNORMAL HIGH (ref 12.3–15.4)
WBC: 7.4 10*3/uL (ref 3.4–10.8)

## 2015-08-16 LAB — COMPREHENSIVE METABOLIC PANEL
ALBUMIN: 4.5 g/dL (ref 3.5–4.7)
ALK PHOS: 118 IU/L — AB (ref 39–117)
ALT: 15 IU/L (ref 0–32)
AST: 16 IU/L (ref 0–40)
Albumin/Globulin Ratio: 1.9 (ref 1.1–2.5)
BUN / CREAT RATIO: 20 (ref 11–26)
BUN: 13 mg/dL (ref 8–27)
Bilirubin Total: 0.2 mg/dL (ref 0.0–1.2)
CO2: 23 mmol/L (ref 18–29)
CREATININE: 0.64 mg/dL (ref 0.57–1.00)
Calcium: 10.8 mg/dL — ABNORMAL HIGH (ref 8.7–10.3)
Chloride: 97 mmol/L (ref 96–106)
GFR calc Af Amer: 97 mL/min/{1.73_m2} (ref >59)
GFR calc non Af Amer: 84 mL/min/{1.73_m2} (ref >59)
GLOBULIN, TOTAL: 2.4 g/dL (ref 1.5–4.5)
Glucose: 78 mg/dL (ref 65–99)
Potassium: 5.2 mmol/L (ref 3.5–5.2)
SODIUM: 137 mmol/L (ref 134–144)
Total Protein: 6.9 g/dL (ref 6.0–8.5)

## 2015-08-16 NOTE — Telephone Encounter (Signed)
SHE WAS LOOKING OVER HER MEDS AND SHE HAS NOT HAD THE CITALOPRAN SINCE AUG 2016 AND WANTS TO KNOW IF THIS IS OK TO DISCONTINUE. PLEASE LET NURSE KNOW AND THE NUMBER IS ON THE MESSAGE

## 2015-08-16 NOTE — Telephone Encounter (Signed)
It looks like it has been discontinued?

## 2015-08-16 NOTE — Telephone Encounter (Signed)
Jocelyn Burgess from my review of her medication Celexa (citalopram) in her Epic chart it does not seem to have been refilled by me or anyone else since we went live with Epic in 2016. I would advise patient if she has not been on it for some months to stay off of the medication and you can remove the medication from her med list.   If there is confusion as to when she last got a refill for that medication perhaps a call to her pharmacist would be best.

## 2015-08-21 ENCOUNTER — Emergency Department: Payer: Medicare Other

## 2015-08-21 ENCOUNTER — Inpatient Hospital Stay
Admission: EM | Admit: 2015-08-21 | Discharge: 2015-08-23 | DRG: 872 | Disposition: A | Discharge status: Home Health | Payer: Medicare Other | Attending: Internal Medicine | Admitting: Internal Medicine

## 2015-08-21 ENCOUNTER — Encounter: Payer: Self-pay | Admitting: Emergency Medicine

## 2015-08-21 DIAGNOSIS — R748 Abnormal levels of other serum enzymes: Secondary | ICD-10-CM | POA: Diagnosis present

## 2015-08-21 DIAGNOSIS — R410 Disorientation, unspecified: Secondary | ICD-10-CM | POA: Diagnosis not present

## 2015-08-21 DIAGNOSIS — F22 Delusional disorders: Secondary | ICD-10-CM | POA: Diagnosis present

## 2015-08-21 DIAGNOSIS — J449 Chronic obstructive pulmonary disease, unspecified: Secondary | ICD-10-CM | POA: Diagnosis present

## 2015-08-21 DIAGNOSIS — F17210 Nicotine dependence, cigarettes, uncomplicated: Secondary | ICD-10-CM | POA: Diagnosis present

## 2015-08-21 DIAGNOSIS — N390 Urinary tract infection, site not specified: Secondary | ICD-10-CM | POA: Diagnosis present

## 2015-08-21 DIAGNOSIS — Z881 Allergy status to other antibiotic agents status: Secondary | ICD-10-CM | POA: Diagnosis not present

## 2015-08-21 DIAGNOSIS — F0390 Unspecified dementia without behavioral disturbance: Secondary | ICD-10-CM | POA: Diagnosis present

## 2015-08-21 DIAGNOSIS — R300 Dysuria: Secondary | ICD-10-CM | POA: Diagnosis present

## 2015-08-21 DIAGNOSIS — M810 Age-related osteoporosis without current pathological fracture: Secondary | ICD-10-CM | POA: Diagnosis present

## 2015-08-21 DIAGNOSIS — E118 Type 2 diabetes mellitus with unspecified complications: Secondary | ICD-10-CM | POA: Diagnosis present

## 2015-08-21 DIAGNOSIS — Z7982 Long term (current) use of aspirin: Secondary | ICD-10-CM | POA: Diagnosis not present

## 2015-08-21 DIAGNOSIS — Z888 Allergy status to other drugs, medicaments and biological substances status: Secondary | ICD-10-CM

## 2015-08-21 DIAGNOSIS — F419 Anxiety disorder, unspecified: Secondary | ICD-10-CM | POA: Diagnosis present

## 2015-08-21 DIAGNOSIS — F0150 Vascular dementia without behavioral disturbance: Secondary | ICD-10-CM | POA: Diagnosis present

## 2015-08-21 DIAGNOSIS — Z7984 Long term (current) use of oral hypoglycemic drugs: Secondary | ICD-10-CM | POA: Diagnosis not present

## 2015-08-21 DIAGNOSIS — R7989 Other specified abnormal findings of blood chemistry: Secondary | ICD-10-CM

## 2015-08-21 DIAGNOSIS — A419 Sepsis, unspecified organism: Secondary | ICD-10-CM | POA: Diagnosis present

## 2015-08-21 DIAGNOSIS — J45909 Unspecified asthma, uncomplicated: Secondary | ICD-10-CM | POA: Diagnosis present

## 2015-08-21 DIAGNOSIS — R6510 Systemic inflammatory response syndrome (SIRS) of non-infectious origin without acute organ dysfunction: Secondary | ICD-10-CM

## 2015-08-21 DIAGNOSIS — E785 Hyperlipidemia, unspecified: Secondary | ICD-10-CM | POA: Diagnosis present

## 2015-08-21 DIAGNOSIS — R4182 Altered mental status, unspecified: Secondary | ICD-10-CM

## 2015-08-21 DIAGNOSIS — Z79899 Other long term (current) drug therapy: Secondary | ICD-10-CM

## 2015-08-21 DIAGNOSIS — Z7951 Long term (current) use of inhaled steroids: Secondary | ICD-10-CM

## 2015-08-21 DIAGNOSIS — I10 Essential (primary) hypertension: Secondary | ICD-10-CM | POA: Diagnosis present

## 2015-08-21 DIAGNOSIS — R778 Other specified abnormalities of plasma proteins: Secondary | ICD-10-CM

## 2015-08-21 HISTORY — DX: Anxiety disorder, unspecified: F41.9

## 2015-08-21 LAB — URINALYSIS COMPLETE WITH MICROSCOPIC (ARMC ONLY)
BILIRUBIN URINE: NEGATIVE
Bacteria, UA: NONE SEEN
GLUCOSE, UA: NEGATIVE mg/dL
Ketones, ur: NEGATIVE mg/dL
Nitrite: NEGATIVE
Protein, ur: NEGATIVE mg/dL
Specific Gravity, Urine: 1.012 (ref 1.005–1.030)
pH: 6 (ref 5.0–8.0)

## 2015-08-21 LAB — COMPREHENSIVE METABOLIC PANEL
ALK PHOS: 106 U/L (ref 38–126)
ALT: 14 U/L (ref 14–54)
AST: 20 U/L (ref 15–41)
Albumin: 3.8 g/dL (ref 3.5–5.0)
Anion gap: 13 (ref 5–15)
BUN: 13 mg/dL (ref 6–20)
CALCIUM: 9.8 mg/dL (ref 8.9–10.3)
CO2: 24 mmol/L (ref 22–32)
CREATININE: 0.48 mg/dL (ref 0.44–1.00)
Chloride: 99 mmol/L — ABNORMAL LOW (ref 101–111)
GFR calc Af Amer: >60 mL/min (ref >60)
GLUCOSE: 207 mg/dL — AB (ref 65–99)
POTASSIUM: 3.5 mmol/L (ref 3.5–5.1)
Sodium: 136 mmol/L (ref 135–145)
TOTAL PROTEIN: 7.4 g/dL (ref 6.5–8.1)

## 2015-08-21 LAB — CBC WITH DIFFERENTIAL/PLATELET
BASOS ABS: 0 10*3/uL (ref 0–0.1)
Basophils Relative: 1 %
EOS PCT: 1 %
Eosinophils Absolute: 0.1 10*3/uL (ref 0–0.7)
HCT: 32.6 % — ABNORMAL LOW (ref 35.0–47.0)
Hemoglobin: 10.7 g/dL — ABNORMAL LOW (ref 12.0–16.0)
LYMPHS PCT: 10 %
Lymphs Abs: 1 10*3/uL (ref 1.0–3.6)
MCH: 26.4 pg (ref 26.0–34.0)
MCHC: 32.9 g/dL (ref 32.0–36.0)
MCV: 80.5 fL (ref 80.0–100.0)
MONO ABS: 0.6 10*3/uL (ref 0.2–0.9)
Monocytes Relative: 7 %
Neutro Abs: 8.1 10*3/uL — ABNORMAL HIGH (ref 1.4–6.5)
Neutrophils Relative %: 81 %
PLATELETS: 362 10*3/uL (ref 150–440)
RBC: 4.05 MIL/uL (ref 3.80–5.20)
RDW: 17.4 % — AB (ref 11.5–14.5)
WBC: 9.9 10*3/uL (ref 3.6–11.0)

## 2015-08-21 LAB — LACTIC ACID, PLASMA
LACTIC ACID, VENOUS: 1.9 mmol/L (ref 0.5–2.0)
Lactic Acid, Venous: 1.2 mmol/L (ref 0.5–2.0)

## 2015-08-21 LAB — TROPONIN I: TROPONIN I: 0.05 ng/mL — AB (ref <0.031)

## 2015-08-21 MED ORDER — SODIUM CHLORIDE 0.9% FLUSH
3.0000 mL | Freq: Two times a day (BID) | INTRAVENOUS | Status: DC
  Administered 2015-08-22: 3 mL via INTRAVENOUS

## 2015-08-21 MED ORDER — MONTELUKAST SODIUM 10 MG PO TABS
10.0000 mg | ORAL_TABLET | Freq: Every day | ORAL | Status: DC
  Administered 2015-08-22: 10 mg via ORAL
  Filled 2015-08-21 (×2): qty 1

## 2015-08-21 MED ORDER — SODIUM CHLORIDE 0.9 % IV BOLUS (SEPSIS)
500.0000 mL | INTRAVENOUS | Status: AC
  Administered 2015-08-21: 500 mL via INTRAVENOUS

## 2015-08-21 MED ORDER — FUROSEMIDE 40 MG PO TABS
20.0000 mg | ORAL_TABLET | ORAL | Status: DC
  Administered 2015-08-23: 20 mg via ORAL
  Filled 2015-08-21 (×2): qty 1

## 2015-08-21 MED ORDER — DIAZEPAM 5 MG PO TABS
5.0000 mg | ORAL_TABLET | Freq: Three times a day (TID) | ORAL | Status: DC | PRN
  Administered 2015-08-22: 5 mg via ORAL
  Filled 2015-08-21: qty 1

## 2015-08-21 MED ORDER — THEOPHYLLINE ER 400 MG PO TB24
200.0000 mg | ORAL_TABLET | Freq: Two times a day (BID) | ORAL | Status: DC
  Administered 2015-08-22 – 2015-08-23 (×2): 200 mg via ORAL
  Filled 2015-08-21 (×7): qty 0.5

## 2015-08-21 MED ORDER — CLONIDINE HCL 0.1 MG PO TABS
0.1000 mg | ORAL_TABLET | Freq: Two times a day (BID) | ORAL | Status: DC
  Administered 2015-08-22 – 2015-08-23 (×3): 0.1 mg via ORAL
  Filled 2015-08-21 (×4): qty 1

## 2015-08-21 MED ORDER — ASPIRIN EC 81 MG PO TBEC
81.0000 mg | DELAYED_RELEASE_TABLET | Freq: Every day | ORAL | Status: DC
  Administered 2015-08-22: 81 mg via ORAL
  Filled 2015-08-21 (×2): qty 1

## 2015-08-21 MED ORDER — VANCOMYCIN HCL IN DEXTROSE 1-5 GM/200ML-% IV SOLN
1000.0000 mg | Freq: Once | INTRAVENOUS | Status: AC
  Administered 2015-08-21: 1000 mg via INTRAVENOUS
  Filled 2015-08-21: qty 200

## 2015-08-21 MED ORDER — ONDANSETRON HCL 4 MG/2ML IJ SOLN
4.0000 mg | Freq: Four times a day (QID) | INTRAMUSCULAR | Status: DC | PRN
Start: 2015-08-21 — End: 2015-08-23

## 2015-08-21 MED ORDER — ACETAMINOPHEN 325 MG PO TABS: 650.0000 mg | ORAL_TABLET | Freq: Four times a day (QID) | ORAL | Status: DC | PRN

## 2015-08-21 MED ORDER — PIPERACILLIN-TAZOBACTAM 3.375 G IVPB 30 MIN
3.3750 g | Freq: Once | INTRAVENOUS | Status: AC
Start: 2015-08-21 — End: 2015-08-21
  Administered 2015-08-21: 3.375 g via INTRAVENOUS
  Filled 2015-08-21: qty 50

## 2015-08-21 MED ORDER — INSULIN ASPART 100 UNIT/ML ~~LOC~~ SOLN
0.0000 [IU] | Freq: Three times a day (TID) | SUBCUTANEOUS | Status: DC
  Administered 2015-08-22: 1 [IU] via SUBCUTANEOUS
  Filled 2015-08-21: qty 3
  Filled 2015-08-21: qty 1

## 2015-08-21 MED ORDER — ONDANSETRON HCL 4 MG PO TABS: 4.0000 mg | ORAL_TABLET | Freq: Four times a day (QID) | ORAL | Status: DC | PRN

## 2015-08-21 MED ORDER — LORATADINE 10 MG PO TABS
10.0000 mg | ORAL_TABLET | Freq: Every day | ORAL | Status: DC
  Administered 2015-08-22: 10 mg via ORAL
  Filled 2015-08-21: qty 1

## 2015-08-21 MED ORDER — TIOTROPIUM BROMIDE MONOHYDRATE 18 MCG IN CAPS
18.0000 ug | ORAL_CAPSULE | Freq: Every day | RESPIRATORY_TRACT | Status: DC
  Administered 2015-08-22: 18 ug via RESPIRATORY_TRACT
  Filled 2015-08-21 (×2): qty 5

## 2015-08-21 MED ORDER — DONEPEZIL HCL 5 MG PO TABS
5.0000 mg | ORAL_TABLET | Freq: Every day | ORAL | Status: DC
  Administered 2015-08-22: 5 mg via ORAL
  Filled 2015-08-21 (×2): qty 1

## 2015-08-21 MED ORDER — INSULIN ASPART 100 UNIT/ML ~~LOC~~ SOLN: 0.0000 [IU] | Freq: Every day | SUBCUTANEOUS | Status: DC

## 2015-08-21 MED ORDER — BISACODYL 5 MG PO TBEC: 5.0000 mg | DELAYED_RELEASE_TABLET | Freq: Every day | ORAL | Status: DC | PRN

## 2015-08-21 MED ORDER — DOCUSATE SODIUM 100 MG PO CAPS
100.0000 mg | ORAL_CAPSULE | Freq: Two times a day (BID) | ORAL | Status: DC
  Administered 2015-08-22 – 2015-08-23 (×3): 100 mg via ORAL
  Filled 2015-08-21 (×4): qty 1

## 2015-08-21 MED ORDER — ENOXAPARIN SODIUM 40 MG/0.4ML ~~LOC~~ SOLN
40.0000 mg | SUBCUTANEOUS | Status: DC
  Filled 2015-08-21: qty 0.4

## 2015-08-21 MED ORDER — PRAVASTATIN SODIUM 20 MG PO TABS
20.0000 mg | ORAL_TABLET | Freq: Every day | ORAL | Status: DC
  Filled 2015-08-21: qty 1

## 2015-08-21 MED ORDER — CITALOPRAM HYDROBROMIDE 20 MG PO TABS
20.0000 mg | ORAL_TABLET | Freq: Every day | ORAL | Status: DC
  Administered 2015-08-22: 20 mg via ORAL
  Filled 2015-08-21: qty 1

## 2015-08-21 MED ORDER — SODIUM CHLORIDE 0.9 % IV BOLUS (SEPSIS)
1000.0000 mL | INTRAVENOUS | Status: AC
  Administered 2015-08-21 (×2): 1000 mL via INTRAVENOUS

## 2015-08-21 MED ORDER — ACETAMINOPHEN 650 MG RE SUPP: 650.0000 mg | Freq: Four times a day (QID) | RECTAL | Status: DC | PRN

## 2015-08-21 NOTE — ED Notes (Signed)
Patient presents to the ED from home from EMS.  Patient reports feeling occasionally confused the past 2 days.  Patient states this morning she started thinking that maybe her husband was poisoning her.  Patient is tearful and states, "my husband is the sweetest husband in the whole world.  I don't know why I'm thinking these things."  Patient reports mutual confusion with her and her husband.  Patient reports history of low sodium and some confusion in the past.

## 2015-08-21 NOTE — ED Provider Notes (Signed)
[facility] Emergency Department Provider Note   ____________________________________________  Time seen: ~1855  I have reviewed the triage vital signs and the nursing notes.   HISTORY  Chief Complaint Altered Mental Status   History limited by: Altered Mental Status   HPI Jocelyn Burgess is a 82 y.o. female who presented to the emergency department today via EMS because of concerns for altered mental status and confusion. On exam the patient is somewhat altered and quite anxious. She states that she was feeling unwell yesterday. She denies any pain with that. She states that today she started feeling like her husband was trying to drug or. She will intermittently start crying and states she does not know why she thinks this. She knows her husband is a good man. She knows that he would never try to hurt her. She however states she does continue to feel that way. She states she had a similar problem with her thinking roughly 1 year ago. She states that at that time her sodium was found below. She denies any recent fevers or pain.   Past Medical History  Diagnosis Date  . Allergy   . Cancer ([place])   . Diabetes mellitus without complication ([place])   . Arthritis   . Asthma   . Cataract   . COPD (chronic obstructive pulmonary disease) ([place])   . Depression     sometimes  . Hyperlipidemia   . Hypertension   . Oxygen deficiency   . Major depressive disorder, recurrent, in full remission with anxious distress ([place]) 11/23/2014  . Vertebral fracture, osteoporotic ([place]) 08/07/2015    Patient Active Problem List   Diagnosis Date Noted  . Dementia 08/08/2015  . Dyspnea on exertion 08/07/2015  . Vertebral fracture, osteoporotic ([place]) 08/07/2015  . Fracture of rib of left side with routine healing 08/07/2015  . AP (abdominal pain)   . Abdominal pain 07/21/2015  . Acute hyponatremia 07/21/2015  . Hypokalemia 07/21/2015  . Left hip pain 07/09/2015  . Benign  essential tremor 07/09/2015  . Venous stasis dermatitis of both lower extremities 07/03/2015  . Bilateral edema of lower extremity 05/30/2015  . Bleeding nose 05/08/2015  . Morning headache 05/01/2015  . Intermittent confusion 04/17/2015  . Cough 04/17/2015  . Constipation, slow transit 04/09/2015  . Allergic rhinitis with postnasal drip 04/09/2015  . Hypertension goal BP (blood pressure) < 140/90 01/22/2015  . Major depressive disorder, recurrent, in full remission with anxious distress ([place]) 11/23/2014  . Heavy cigarette smoker 11/23/2014  . COPD, severe ([place]) 11/23/2014  . DD (diverticular disease) 11/23/2014  . Personal history of malignant neoplasm of breast 11/23/2014  . History of digestive disease 11/23/2014  . Personal history of healed traumatic fracture 11/23/2014  . HLD (hyperlipidemia) 11/23/2014  . Peripheral neuropathic pain ([place]) 11/23/2014  . At risk for falling 11/23/2014  . Diabetes mellitus type 2, controlled, with complications ([place]) [id number]    Past Surgical History  Procedure Laterality Date  . Breast surgery Left     Current Outpatient Rx  Name  Route  Sig  Dispense  Refill  . ACCU-CHEK AVIVA PLUS test strip                 Dispense as written.   Marland Kitchen acetaminophen-codeine (TYLENOL #4) 300-60 MG tablet   Oral   Take 1 tablet by mouth every 8 (eight) hours as needed for moderate pain.   90 tablet   0     Refill 08/15/15   .  albuterol (PROVENTIL HFA;VENTOLIN HFA) 108 (90 Base) MCG/ACT inhaler   Inhalation   Inhale 1-2 puffs into the lungs every 4 (four) hours as needed for wheezing or shortness of breath.   1 Inhaler   5     PATIENT ALLERGIC TO PROAIR   . aspirin EC 81 MG tablet   Oral   Take 81 mg by mouth at bedtime.         . bisacodyl (BISACODYL) 5 MG EC tablet   Oral   Take 1 tablet (5 mg total) by mouth daily as needed for moderate constipation.   30 tablet   0   . citalopram (CELEXA) 20 MG tablet   Oral   Take 20 mg by  mouth at bedtime.          . cloNIDine (CATAPRES) 0.1 MG tablet   Oral   Take 0.1 mg by mouth 2 (two) times daily.         . diazepam (VALIUM) 5 MG tablet   Oral   Take 1 tablet (5 mg total) by mouth every 8 (eight) hours as needed for anxiety.   270 tablet   1     90 day supply with 1 refill   . docusate sodium (COLACE) 100 MG capsule   Oral   Take 1 capsule (100 mg total) by mouth 2 (two) times daily.   10 capsule   0   . donepezil (ARICEPT) 5 MG tablet   Oral   Take 1 tablet (5 mg total) by mouth at bedtime.   30 tablet   1   . furosemide (LASIX) 20 MG tablet   Oral   Take 1 tablet (20 mg total) by mouth every morning.   90 tablet   1   . gabapentin (NEURONTIN) 300 MG capsule   Oral   Take 300-600 mg by mouth See admin instructions. Take 1 capsule by mouth in the morning and take 2 capsules (600mg ) by mouth every night at bedtime.         Marland Kitchen gemfibrozil (LOPID) 600 MG tablet   Oral   Take 600 mg by mouth 2 (two) times daily before a meal.         . glimepiride (AMARYL) 4 MG tablet   Oral   Take 4 mg by mouth 2 (two) times daily.          . hydrochlorothiazide (HYDRODIURIL) 25 MG tablet   Oral   Take 25 mg by mouth daily.      2   . LINZESS 145 MCG CAPS capsule                 Dispense as written.   . loratadine (CLARITIN) 10 MG tablet   Oral   Take 10 mg by mouth daily.          Marland Kitchen lovastatin (MEVACOR) 20 MG tablet   Oral   Take 20 mg by mouth at bedtime.         . magnesium hydroxide (MILK OF MAGNESIA) 400 MG/5ML suspension   Oral   Take 30 mLs by mouth daily as needed for mild constipation.          . meclizine (ANTIVERT) 25 MG tablet   Oral   Take 25 mg by mouth every 8 (eight) hours as needed for dizziness.         . meloxicam (MOBIC) 15 MG tablet   Oral   Take 1 tablet (15 mg total) by mouth  daily.   90 tablet   2   . metFORMIN (GLUCOPHAGE) 1000 MG tablet   Oral   Take 1 tablet (1,000 mg total) by mouth 2 (two)  times daily with a meal.   180 tablet   1     Disregard previous doses and RXs for this medicati ...   . mometasone (ELOCON) 0.1 % lotion   Topical   Apply 1 application topically as needed (for dry,itchy skin). Pt applies to both ears as needed.         . montelukast (SINGULAIR) 10 MG tablet   Oral   Take 10 mg by mouth at bedtime.         Marland Kitchen nystatin (MYCOSTATIN) 100000 UNIT/ML suspension   Oral   Take 5 mLs (500,000 Units total) by mouth 4 (four) times daily. Swish retain in mouth long as possible then swallow   120 mL   0   . EXPIRED: potassium chloride (K-DUR) 10 MEQ tablet   Oral   Take 1 tablet (10 mEq total) by mouth 2 (two) times daily.   60 tablet   1   . SPIRIVA HANDIHALER 18 MCG inhalation capsule      INHALE CONTENTS OF 1 CAPSULE ONCE DAILY   90 capsule   1   . theophylline (UNIPHYL) 400 MG 24 hr tablet   Oral   Take 400 mg by mouth daily.            Allergies Ciprofloxacin; Inhaler decongestant; Molds & smuts; and Albuterol sulfate  Family History  Problem Relation Age of Onset  . Arthritis Mother   . Arthritis Father   . Cancer Sister     brain  . Heart disease Sister   . Stroke Sister   . Diabetes Brother     Social History Social History  Substance Use Topics  . Smoking status: Current Every Day Smoker -- 1.00 packs/day for 45 years    Types: Cigarettes  . Smokeless tobacco: Current User  . Alcohol Use: No    Review of Systems  Constitutional: Negative for fever. Cardiovascular: Negative for chest pain. Respiratory: Negative for shortness of breath. Gastrointestinal: Negative for abdominal pain, vomiting and diarrhea. Neurological: Negative for headaches, focal weakness or numbness.  10-point ROS otherwise negative.  ____________________________________________   PHYSICAL EXAM:  VITAL SIGNS: ED Triage Vitals  Enc Vitals Group     BP 08/21/15 1853 164/99 mmHg     Pulse Rate 08/21/15 1853 113     Resp 08/21/15 1853  14     Temp 08/21/15 1855 100 F (37.8 C)     Temp Source 08/21/15 1855 Oral     SpO2 08/21/15 1853 92 %     Weight 08/21/15 1853 170 lb (77.111 kg)     Height 08/21/15 1853 5\' 5"  (1.651 m)     Head Cir --      Peak Flow --      Pain Score 08/21/15 1854 0   Constitutional: Awake and alert. Occasionally crying. Appears anxious. Eyes: Conjunctivae are normal. PERRL. Normal extraocular movements. ENT   Head: Normocephalic and atraumatic.   Nose: No congestion/rhinnorhea.   Mouth/Throat: Mucous membranes are moist.   Neck: No stridor. Hematological/Lymphatic/Immunilogical: No cervical lymphadenopathy. Cardiovascular: Tachycardic, regular rhythm.  No murmurs, rubs, or gallops. Respiratory: Normal respiratory effort without tachypnea nor retractions. Breath sounds are clear and equal bilaterally. No wheezes/rales/rhonchi. Gastrointestinal: Soft and nontender. No distention.  Genitourinary: Deferred Musculoskeletal: Normal range of motion in all extremities.  No joint effusions.  No lower extremity tenderness nor edema. Neurologic:  Normal speech and language. No gross focal neurologic deficits are appreciated.  Skin:  Skin is warm, dry and intact. No rash noted. Psychiatric: Mood and affect are normal. Speech and behavior are normal. Patient exhibits appropriate insight and judgment.  ____________________________________________    LABS (pertinent positives/negatives)  Labs Reviewed  COMPREHENSIVE METABOLIC PANEL - Abnormal; Notable for the following:    Chloride 99 (*)    Glucose, Bld 207 (*)    Total Bilirubin <0.1 (*)    All other components within normal limits  TROPONIN I - Abnormal; Notable for the following:    Troponin I 0.05 (*)    All other components within normal limits  CBC WITH DIFFERENTIAL/PLATELET - Abnormal; Notable for the following:    Hemoglobin 10.7 (*)    HCT 32.6 (*)    RDW 17.4 (*)    Neutro Abs 8.1 (*)    All other components within normal  limits  URINALYSIS COMPLETEWITH MICROSCOPIC ([facility] ONLY) - Abnormal; Notable for the following:    Color, Urine YELLOW (*)    APPearance CLEAR (*)    Hgb urine dipstick 1+ (*)    Leukocytes, UA TRACE (*)    Squamous Epithelial / LPF 0-5 (*)    All other components within normal limits  CULTURE, BLOOD (ROUTINE X 2)  CULTURE, BLOOD (ROUTINE X 2)  URINE CULTURE  LACTIC ACID, PLASMA  LACTIC ACID, PLASMA     ____________________________________________   EKG  I, Nance Pear, attending physician, personally viewed and interpreted this EKG  EKG Time: 1906 Rate: 106 Rhythm: sinus tachycardia Axis: normal Intervals: qtc 427 QRS: narrow ST changes: no st elevation Impression: abnormal ekg  ____________________________________________    RADIOLOGY  CXR IMPRESSION: Chronic lung disease with bibasilar atelectasis  ____________________________________________   PROCEDURES  Procedure(s) performed: None  Critical Care performed: Yes, see critical care note(s)  CRITICAL CARE Performed by: Nance Pear   Total critical care time: 35 minutes  Critical care time was exclusive of separately billable procedures and treating other patients.  Critical care was necessary to treat or prevent imminent or life-threatening deterioration.  Critical care was time spent personally by me on the following activities: development of treatment plan with patient and/or surrogate as well as nursing, discussions with consultants, evaluation of patient's response to treatment, examination of patient, obtaining history from patient or surrogate, ordering and performing treatments and interventions, ordering and review of laboratory studies, ordering and review of radiographic studies, pulse oximetry and re-evaluation of patient's condition.  ____________________________________________   INITIAL IMPRESSION / ASSESSMENT AND PLAN / ED COURSE  Pertinent labs & imaging results that were  available during my care of the patient were reviewed by me and considered in my medical decision making (see chart for details).  Patient presented to the emergency department today because of concerns for altered mental status. On exam patient was alert however was very emotionally labile, frequently crying stating that she felt like her husband was given [place] although she knew he was a good person. Initial vital signs concerning for sepsis given elevated temperature and tachycardia. Because of this patient was written for fluid bolus and multiple broad-spectrum antibiotics. Blood work and urine consistent with urinary tract infection. Additionally patient did have a mild elevation of the troponin which given the relatively benign EKG I think is likely due to demand ischemia. Will plan on admission to the hospitalist service for further workup and management.  ____________________________________________   FINAL CLINICAL IMPRESSION(S) / ED DIAGNOSES  Elevated troponin UTI Sirs  Nance Pear, MD 08/21/15 2205

## 2015-08-21 NOTE — H&P (Signed)
[facility] [place] NAME: Jocelyn Burgess    MR#:  [id number]  DATE OF BIRTH:  11-Jan-1932  DATE OF ADMISSION:  08/21/2015  PRIMARY CARE PHYSICIAN: Bobetta Lime, MD   REQUESTING/REFERRING PHYSICIAN: Archie Balboa, [place]  CHIEF COMPLAINT:   Chief Complaint  Patient presents with  . Altered Mental Status    HISTORY OF PRESENT ILLNESS:  Jocelyn Burgess  is a 82 y.o. female who presents with confusion. Patient and her husband state that earlier today she was acutely confused, became paranoid feeling like somebody was trying to kill her, worried that her husband was trying to give her medication to harm her. Patient provides some accurate information in history, though she is oftentimes also speaking in a confused way, with comments that are wondering or distracted or not coherent. On presentation in the ED she was febrile and tachycardic. She had sepsis workup and treatment initiated. She states that she has had some urinary frequency and dysuria recently. Hospitals were called for admission for sepsis from likely UTI.  PAST MEDICAL HISTORY:   Past Medical History  Diagnosis Date  . Allergy   . Cancer ([place])   . Diabetes mellitus without complication ([place])   . Arthritis   . Asthma   . Cataract   . COPD (chronic obstructive pulmonary disease) ([place])   . Depression     sometimes  . Hyperlipidemia   . Hypertension   . Oxygen deficiency   . Major depressive disorder, recurrent, in full remission with anxious distress ([place]) 11/23/2014  . Vertebral fracture, osteoporotic ([place]) 08/07/2015  . Anxiety     PAST SURGICAL HISTORY:   Past Surgical History  Procedure Laterality Date  . Breast surgery Left     SOCIAL HISTORY:   Social History  Substance Use Topics  . Smoking status: Current Every Day Smoker -- 1.00 packs/day for 45 years    Types: Cigarettes  . Smokeless tobacco: Current User  . Alcohol Use: No    FAMILY HISTORY:   Family  History  Problem Relation Age of Onset  . Arthritis Mother   . Arthritis Father   . Cancer Sister     brain  . Heart disease Sister   . Stroke Sister   . Diabetes Brother     DRUG ALLERGIES:   Allergies  Allergen Reactions  . Ciprofloxacin Other (See Comments)    Reaction:  Unknown   . Inhaler Decongestant [Methamphetamine] Other (See Comments)    Reaction:  Unknown   . Molds & Smuts Other (See Comments)    Reaction:  Wheezing/itchy,watery eyes   . Albuterol Sulfate Palpitations and Other (See Comments)    Pt states that she is allergic to the [facility] brand only.      MEDICATIONS AT HOME:   Prior to Admission medications   Medication Sig Start Date End Date Taking? Authorizing Provider  acetaminophen-codeine (TYLENOL #4) 300-60 MG tablet Take 1 tablet by mouth every 8 (eight) hours as needed for moderate pain. 08/15/15  Yes Bobetta Lime, MD  albuterol (PROVENTIL HFA;VENTOLIN HFA) 108 (90 Base) MCG/ACT inhaler Inhale 1-2 puffs into the lungs every 4 (four) hours as needed for wheezing or shortness of breath. 08/15/15  Yes Bobetta Lime, MD  aspirin EC 81 MG tablet Take 81 mg by mouth at bedtime.   Yes Historical Provider, MD  bisacodyl (BISACODYL) 5 MG EC tablet Take 1 tablet (5 mg total) by mouth daily as needed for moderate constipation. 07/26/15  Yes Demetrios Loll, MD  citalopram (CELEXA) 20 MG tablet Take 20 mg by mouth at bedtime.    Yes Historical Provider, MD  cloNIDine (CATAPRES) 0.1 MG tablet Take 0.1 mg by mouth 2 (two) times daily.   Yes Historical Provider, MD  diazepam (VALIUM) 5 MG tablet Take 1 tablet (5 mg total) by mouth every 8 (eight) hours as needed for anxiety. 07/09/15  Yes Bobetta Lime, MD  docusate sodium (COLACE) 100 MG capsule Take 1 capsule (100 mg total) by mouth 2 (two) times daily. 07/26/15  Yes Demetrios Loll, MD  donepezil (ARICEPT) 5 MG tablet Take 1 tablet (5 mg total) by mouth at bedtime. 08/08/15  Yes Bobetta Lime, MD  furosemide (LASIX) 20 MG tablet  Take 1 tablet (20 mg total) by mouth every morning. 07/30/15  Yes Bobetta Lime, MD  gabapentin (NEURONTIN) 300 MG capsule Take 300-600 mg by mouth 2 (two) times daily. Pt takes one capsule in the morning and two at bedtime.   Yes Historical Provider, MD  gemfibrozil (LOPID) 600 MG tablet Take 600 mg by mouth 2 (two) times daily before a meal.   Yes Historical Provider, MD  glimepiride (AMARYL) 4 MG tablet Take 4 mg by mouth 2 (two) times daily.    Yes Historical Provider, MD  loratadine (CLARITIN) 10 MG tablet Take 10 mg by mouth daily.    Yes Historical Provider, MD  lovastatin (MEVACOR) 20 MG tablet Take 20 mg by mouth at bedtime.   Yes Historical Provider, MD  meclizine (ANTIVERT) 25 MG tablet Take 25 mg by mouth every 8 (eight) hours as needed for dizziness.   Yes Historical Provider, MD  meloxicam (MOBIC) 15 MG tablet Take 1 tablet (15 mg total) by mouth daily. 05/01/15  Yes Bobetta Lime, MD  metFORMIN (GLUCOPHAGE) 1000 MG tablet Take 1 tablet (1,000 mg total) by mouth 2 (two) times daily with a meal. 07/03/15  Yes Bobetta Lime, MD  mometasone (ELOCON) 0.1 % lotion Apply 1 application topically as needed (for dry,itchy skin). Pt applies to both ears as needed.   Yes Historical Provider, MD  montelukast (SINGULAIR) 10 MG tablet Take 10 mg by mouth at bedtime.   Yes Historical Provider, MD  potassium chloride (K-DUR) 10 MEQ tablet Take 10 mEq by mouth 2 (two) times daily.   Yes Historical Provider, MD  theophylline (UNIPHYL) 400 MG 24 hr tablet Take 200 mg by mouth 2 (two) times daily.    Yes Historical Provider, MD  tiotropium (SPIRIVA) 18 MCG inhalation capsule Place 18 mcg into inhaler and inhale at bedtime.   Yes Historical Provider, MD  nystatin (MYCOSTATIN) 100000 UNIT/ML suspension Take 5 mLs (500,000 Units total) by mouth 4 (four) times daily. Swish retain in mouth long as possible then swallow Patient not taking: Reported on 08/21/2015 05/23/15   Bobetta Lime, MD    REVIEW OF  SYSTEMS:  Review of Systems  Constitutional: Positive for fever. Negative for chills, weight loss and malaise/fatigue.  HENT: Negative for ear pain, hearing loss and tinnitus.   Eyes: Negative for blurred vision, double vision, pain and redness.  Respiratory: Negative for cough, hemoptysis and shortness of breath.   Cardiovascular: Negative for chest pain, palpitations, orthopnea and leg swelling.  Gastrointestinal: Negative for nausea, vomiting, abdominal pain, diarrhea and constipation.  Genitourinary: Positive for frequency. Negative for dysuria and hematuria.  Musculoskeletal: Negative for back pain, joint pain and neck pain.  Skin:       No acne, rash, or lesions  Neurological:  Negative for dizziness, tremors, focal weakness and weakness.  Endo/Heme/Allergies: Negative for polydipsia. Does not bruise/bleed easily.  Psychiatric/Behavioral: Negative for depression. The patient is not nervous/anxious and does not have insomnia.        Paranoia, confusion     VITAL SIGNS:   Filed Vitals:   08/21/15 1902 08/21/15 1945 08/21/15 2102 08/21/15 2209  BP:  155/69 150/69 141/63  Pulse:  98 93 97  Temp: 100.4 F (38 C)     TempSrc: Rectal     Resp:  16 16 16   Height:      Weight:      SpO2:  96% 97% 96%   Wt Readings from Last 3 Encounters:  08/21/15 77.111 kg (170 lb)  08/15/15 76.233 kg (168 lb 1 oz)  08/07/15 74.753 kg (164 lb 12.8 oz)    PHYSICAL EXAMINATION:  Physical Exam  Vitals reviewed. Constitutional: She is oriented to person, place, and time. She appears well-developed and well-nourished. No distress.  HENT:  Head: Normocephalic and atraumatic.  Mouth/Throat: Oropharynx is clear and moist.  Eyes: Conjunctivae and EOM are normal. Pupils are equal, round, and reactive to light. No scleral icterus.  Neck: Normal range of motion. Neck supple. No JVD present. No thyromegaly present.  Cardiovascular: Regular rhythm and intact distal pulses.  Exam reveals no gallop and no  friction rub.   No murmur heard. Tachycardic  Respiratory: Effort normal and breath sounds normal. No respiratory distress. She has no wheezes. She has no rales.  GI: Soft. Bowel sounds are normal. She exhibits no distension. There is no tenderness.  Musculoskeletal: Normal range of motion. She exhibits no edema.  No arthritis, no gout  Lymphadenopathy:    She has no cervical adenopathy.  Neurological: She is alert and oriented to person, place, and time. No cranial nerve deficit.  No dysarthria, no aphasia  Skin: Skin is warm and dry. No rash noted. No erythema.  Psychiatric:  Scattered thoughts, sometimes comments unrelated to topic of discussion, some comments incoherent.    LABORATORY PANEL:   CBC  Recent Labs Lab 08/21/15 1932  WBC 9.9  HGB 10.7*  HCT 32.6*  PLT 362   ------------------------------------------------------------------------------------------------------------------  Chemistries   Recent Labs Lab 08/21/15 1932  NA 136  K 3.5  CL 99*  CO2 24  GLUCOSE 207*  BUN 13  CREATININE 0.48  CALCIUM 9.8  AST 20  ALT 14  ALKPHOS 106  BILITOT <0.1*   ------------------------------------------------------------------------------------------------------------------  Cardiac Enzymes  Recent Labs Lab 08/21/15 1932  TROPONINI 0.05*   ------------------------------------------------------------------------------------------------------------------  RADIOLOGY:  Dg Chest Port 1 View  08/21/2015  CLINICAL DATA:  Altered mental status. Intermittent confusion for 2 days. EXAM: PORTABLE CHEST 1 VIEW COMPARISON:  Radiographs 07/25/2015, CT 07/23/2015 FINDINGS: Cardiomediastinal contours are unchanged with aortic atherosclerosis. The lungs are hyperinflated with emphysema. The small right lower lobe pulmonary nodule on prior CT is not visualized. Calcifications projecting over the lower right hemithorax within the right breast. Bibasilar atelectasis and left  basilar scarring, unchanged. No pleural effusion or pneumothorax bones are under mineralized. IMPRESSION: Chronic lung disease with bibasilar atelectasis Electronically Signed   By: Jeb Levering M.D.   On: 08/21/2015 19:54    EKG:   Orders placed or performed during the hospital encounter of 08/21/15  . EKG 12-Lead  . EKG 12-Lead    IMPRESSION AND PLAN:  Principal Problem:   Sepsis ([place]) - suspect secondary to UTI. Patient's UA was not strongly suggestive of infection, though she is  having symptoms of UTI with urinary frequency and some dysuria. This potentially exacerbating her confusion and mental state, however her husband states that she had had a prior workup scheduling process for progressive dementia as she's been having some early symptoms for longer than just the recent past. Antibiotics given in the ED, we'll continue his on admission. Lactic acid was normal, hemodynamically stable. Cultures sent from the ED. Follow-up cultures and tailor antibiotics as appropriate. Active Problems:   UTI (lower urinary tract infection) - antibiotics and cultures as above.   COPD, severe ([place]) - continue home meds, currently stable   Diabetes mellitus type 2, controlled, with complications ([place]) - sliding scale insulin with corresponding glucose checks are modified diet.   Dementia - continue home meds   Anxiety - continue when necessary home dose anxiolytics   HLD (hyperlipidemia) - continue home meds  All the records are reviewed and case discussed with ED provider. Management plans discussed with the patient and/or family.  DVT PROPHYLAXIS: SubQ lovenox  GI PROPHYLAXIS: None  ADMISSION STATUS: Inpatient  CODE STATUS: Full Code Status History    Date Active Date Inactive Code Status Order ID Comments User Context   07/21/2015 10:05 PM 07/26/2015  4:22 PM Full Code [id number]  Idelle Crouch, MD Inpatient      TOTAL TIME TAKING CARE OF THIS PATIENT: 45 minutes.    Cherolyn Behrle  [place] 08/21/2015, 10:36 PM  [facility]  Office  [phone]  CC: Primary care physician; Bobetta Lime, MD

## 2015-08-22 ENCOUNTER — Inpatient Hospital Stay: Payer: Medicare Other

## 2015-08-22 DIAGNOSIS — R410 Disorientation, unspecified: Secondary | ICD-10-CM

## 2015-08-22 LAB — GLUCOSE, CAPILLARY
GLUCOSE-CAPILLARY: 123 mg/dL — AB (ref 65–99)
GLUCOSE-CAPILLARY: 135 mg/dL — AB (ref 65–99)
GLUCOSE-CAPILLARY: 153 mg/dL — AB (ref 65–99)
GLUCOSE-CAPILLARY: 204 mg/dL — AB (ref 65–99)

## 2015-08-22 LAB — MAGNESIUM: Magnesium: 1.3 mg/dL — ABNORMAL LOW (ref 1.7–2.4)

## 2015-08-22 LAB — CBC
HEMATOCRIT: 32.9 % — AB (ref 35.0–47.0)
Hemoglobin: 10.8 g/dL — ABNORMAL LOW (ref 12.0–16.0)
MCH: 26.4 pg (ref 26.0–34.0)
MCHC: 32.8 g/dL (ref 32.0–36.0)
MCV: 80.5 fL (ref 80.0–100.0)
PLATELETS: 354 10*3/uL (ref 150–440)
RBC: 4.09 MIL/uL (ref 3.80–5.20)
RDW: 17.1 % — AB (ref 11.5–14.5)
WBC: 6.8 10*3/uL (ref 3.6–11.0)

## 2015-08-22 LAB — BASIC METABOLIC PANEL
Anion gap: 15 (ref 5–15)
BUN: 7 mg/dL (ref 6–20)
CALCIUM: 9.2 mg/dL (ref 8.9–10.3)
CO2: 24 mmol/L (ref 22–32)
Chloride: 99 mmol/L — ABNORMAL LOW (ref 101–111)
Creatinine, Ser: 0.47 mg/dL (ref 0.44–1.00)
GFR calc Af Amer: >60 mL/min (ref >60)
Glucose, Bld: 165 mg/dL — ABNORMAL HIGH (ref 65–99)
POTASSIUM: 3 mmol/L — AB (ref 3.5–5.1)
SODIUM: 138 mmol/L (ref 135–145)

## 2015-08-22 LAB — TROPONIN I

## 2015-08-22 MED ORDER — PIPERACILLIN-TAZOBACTAM 3.375 G IVPB
3.3750 g | Freq: Three times a day (TID) | INTRAVENOUS | Status: DC
  Administered 2015-08-22: 3.375 g via INTRAVENOUS
  Filled 2015-08-22 (×3): qty 50

## 2015-08-22 MED ORDER — HALOPERIDOL LACTATE 5 MG/ML IJ SOLN: 1.0000 mg | INTRAMUSCULAR | Status: DC | PRN

## 2015-08-22 MED ORDER — ENSURE ENLIVE PO LIQD: 237.0000 mL | Freq: Two times a day (BID) | ORAL | Status: DC

## 2015-08-22 MED ORDER — LORAZEPAM 2 MG PO TABS: 2.0000 mg | ORAL_TABLET | Freq: Once | ORAL | Status: AC

## 2015-08-22 MED ORDER — HALOPERIDOL LACTATE 5 MG/ML IJ SOLN
INTRAMUSCULAR | Status: AC
  Administered 2015-08-22: 10:00:00
  Filled 2015-08-22: qty 1

## 2015-08-22 MED ORDER — POTASSIUM CHLORIDE 20 MEQ PO PACK: 20.0000 meq | PACK | ORAL | Status: AC

## 2015-08-22 MED ORDER — LORAZEPAM 2 MG/ML IJ SOLN
2.0000 mg | Freq: Once | INTRAMUSCULAR | Status: AC
  Administered 2015-08-22: 2 mg via INTRAVENOUS
  Filled 2015-08-22: qty 1

## 2015-08-22 MED ORDER — RISPERIDONE 0.5 MG PO TBDP
0.2500 mg | ORAL_TABLET | Freq: Two times a day (BID) | ORAL | Status: DC
  Administered 2015-08-22 – 2015-08-23 (×2): 0.25 mg via ORAL
  Filled 2015-08-22 (×3): qty 1

## 2015-08-22 MED ORDER — LORAZEPAM 2 MG/ML IJ SOLN
2.0000 mg | Freq: Once | INTRAMUSCULAR | Status: AC
  Administered 2015-08-22: 2 mg via INTRAMUSCULAR
  Filled 2015-08-22: qty 1

## 2015-08-22 MED ORDER — HYDROCODONE-ACETAMINOPHEN 5-325 MG PO TABS
1.0000 | ORAL_TABLET | Freq: Two times a day (BID) | ORAL | Status: DC
  Administered 2015-08-22 – 2015-08-23 (×2): 1 via ORAL
  Filled 2015-08-22 (×2): qty 1

## 2015-08-22 MED ORDER — VANCOMYCIN HCL IN DEXTROSE 1-5 GM/200ML-% IV SOLN
1000.0000 mg | INTRAVENOUS | Status: DC
Start: 2015-08-22 — End: 2015-08-22
  Administered 2015-08-22: 1000 mg via INTRAVENOUS
  Filled 2015-08-22 (×2): qty 200

## 2015-08-22 MED ORDER — CETYLPYRIDINIUM CHLORIDE 0.05 % MT LIQD: 7.0000 mL | Freq: Two times a day (BID) | OROMUCOSAL | Status: DC

## 2015-08-22 MED ORDER — CEPHALEXIN 250 MG PO CAPS
500.0000 mg | ORAL_CAPSULE | Freq: Two times a day (BID) | ORAL | Status: DC
  Administered 2015-08-23: 500 mg via ORAL
  Filled 2015-08-22 (×2): qty 2

## 2015-08-22 NOTE — Progress Notes (Signed)
[facility]  Patient Status: active  [facility] is providing the following services: SN/PT  If patient discharges after hours, please call [phone].   Jocelyn Burgess 08/22/2015, 9:42 AM

## 2015-08-22 NOTE — Progress Notes (Signed)
Chaplain responded to Rapid Response. Jocelyn Burgess [phone]

## 2015-08-22 NOTE — Progress Notes (Signed)
Pharmacy Antibiotic Note  Jocelyn Burgess is a 82 y.o. female admitted on 08/21/2015 with UTI/sepsis.  Pharmacy has been consulted for vancomycin and Zosyn dosing.  Plan: DW 65kg  Vd 46L kei 0.052 hr-1  t1/2 13 hours Vancomycin 1 gram q 18 hours ordered. Level before 5th dose. Goal 15-20.  Zosyn 3.375 grams q 8 hours ordered.  Height: 5\' 5"  (165.1 cm) Weight: 165 lb 1.6 oz (74.889 kg) IBW/kg (Calculated) : 57  Temp (24hrs), Avg:99.6 F (37.6 C), Min:98.3 F (36.8 C), Max:100.4 F (38 C)   Recent Labs Lab 08/15/15 1442 08/21/15 1932 08/21/15 2248  WBC 7.4 9.9  --   CREATININE 0.64 0.48  --   LATICACIDVEN  --  1.9 1.2    Estimated Creatinine Clearance: 55.9 mL/min (by C-G formula based on Cr of 0.48).    Allergies  Allergen Reactions  . Ciprofloxacin Other (See Comments)    Reaction:  Unknown   . Inhaler Decongestant [Methamphetamine] Other (See Comments)    Reaction:  Unknown   . Molds & Smuts Other (See Comments)    Reaction:  Wheezing/itchy,watery eyes   . Albuterol Sulfate Palpitations and Other (See Comments)    Pt states that she is allergic to the [facility] brand only.      Antimicrobials this admission:   >>    >>   Dose adjustments this admission:   Microbiology results: 3/7 BCx: pending 3/7 UCx: pending   UA: LE(tr) NO2(-) WBC 6-30 CXR: atelectasis  Thank you for allowing pharmacy to be a part of this patient's care.  Jocelyn Burgess S 08/22/2015 12:03 AM

## 2015-08-22 NOTE — Consult Note (Signed)
[facility] Face-to-Face Psychiatry Consult   Reason for Consult:  Consult for this 82 year old woman who was brought to the hospital last night with an acute worsening of her mental state with confusion and psychotic symptoms Referring Physician:  Posey Pronto Patient Identification: Jocelyn Burgess MRN:  [id number] Principal Diagnosis: delirium Diagnosis:   Patient Active Problem List   Diagnosis Date Noted  . Acute delirium [R41.0] 08/22/2015  . Sepsis ([place]) [A41.9] 08/21/2015  . Anxiety [F41.9] 08/21/2015  . UTI (lower urinary tract infection) [N39.0] 08/21/2015  . Dementia [F03.90] 08/08/2015  . Dyspnea on exertion [R06.09] 08/07/2015  . Vertebral fracture, osteoporotic ([place]) [M80.88XA] 08/07/2015  . Fracture of rib of left side with routine healing [S22.32XD] 08/07/2015  . AP (abdominal pain) [R10.9]   . Abdominal pain [R10.9] 07/21/2015  . Acute hyponatremia [E87.1] 07/21/2015  . Hypokalemia [E87.6] 07/21/2015  . Left hip pain [M25.552] 07/09/2015  . Benign essential tremor [G25.0] 07/09/2015  . Venous stasis dermatitis of both lower extremities [I83.11, I83.12] 07/03/2015  . Bilateral edema of lower extremity [R60.0] 05/30/2015  . Bleeding nose [R04.0] 05/08/2015  . Morning headache [R51] 05/01/2015  . Intermittent confusion [R41.0] 04/17/2015  . Cough [R05] 04/17/2015  . Constipation, slow transit [K59.01] 04/09/2015  . Allergic rhinitis with postnasal drip [J45.909, R09.82] 04/09/2015  . Hypertension goal BP (blood pressure) < 140/90 [I10] 01/22/2015  . Major depressive disorder, recurrent, in full remission with anxious distress ([place]) [F33.42] 11/23/2014  . Heavy cigarette smoker [F17.210] 11/23/2014  . COPD, severe ([place]) [J44.9] 11/23/2014  . DD (diverticular disease) [K57.90] 11/23/2014  . Personal history of malignant neoplasm of breast [Z85.3] 11/23/2014  . History of digestive disease [Z87.19] 11/23/2014  . Personal history of healed traumatic fracture [Z87.81] 11/23/2014  .  HLD (hyperlipidemia) [E78.5] 11/23/2014  . Peripheral neuropathic pain ([place]) [G62.9] 11/23/2014  . At risk for falling [Z91.81] 11/23/2014  . Diabetes mellitus type 2, controlled, with complications ([place]) [U20.2] 11/23/2014    Total Time spent with patient: 1 hour  Subjective:   Jocelyn Burgess is a 82 y.o. female patient admitted with patient did not offer any complaints and was really not communicative with me. I got most of the history from her husband.Marland Kitchen  HPI:  Husband interviewed. Tried to interview the patient without any success. Reviewed the current and old her chart and medications particularly recent notes from her primary care doctor. 82 year old woman with a history of dementia COPD diabetes chronic pain. Husband reports that for the last 2 or 3 weeks she has had some confusion symptoms that were not present previously. She has been believing that the one trailer that they live in is actually 2 different trailers. She has seemed to be a little bit more guarded or paranoid. Yesterday apparently she became acutely agitated. She told her husband that she did not trust him area her behavior became oppositional and irritable which her husband says is not normal for her. Husband does not report any immediate change in any medication or treatment that day. Did feel like she might of been a little bit sick. Looking back over her recent notes from her primary care doctor I noticed that about a week ago her primary care doctor changed her chronic pain medicine from Norco 5 mg 3 times a day to Tylenol with codeine 3 times a day. Husband reports that they did actually make that change. Patient is described as having significant short-term memory problems at baseline. Also I note in the older notes from the primary  care doctor there've been episodes where she clearly had been worse as well in the past.  Social history: Patient and her husband of 38 years live in a trailer together here in town. Her  closest other relative is a niece who also lives in town. They have been here in [facility] for about a year and a half. She does have an established primary care doctor.  Medical history: History of chronic rib pain, chronic COPD, dyslipidemia, diabetes known history of dementia currently considered to be potentially septic with urinary tract infection  Substance abuse history: Nothing reported in any of the notes and the husband denies any past history of substance abuse problems.  Past Psychiatric History: Husband states that she has no past psychiatric history. No past psychiatric hospitalization no past suicide attempts. We do note that she had been prescribed citalopram in the past. However I also discovered from her primary care doctor's notes that the citalopram had been discontinued by omission several months before and that he had decided to stop it officially, so I don't think she's been on that medicine in a while.  Risk to Self: Is patient at risk for suicide?: No Risk to Others:   Prior Inpatient Therapy:   Prior Outpatient Therapy:    Past Medical History:  Past Medical History  Diagnosis Date  . Allergy   . Cancer ([place])   . Diabetes mellitus without complication ([place])   . Arthritis   . Asthma   . Cataract   . COPD (chronic obstructive pulmonary disease) ([place])   . Depression     sometimes  . Hyperlipidemia   . Hypertension   . Oxygen deficiency   . Major depressive disorder, recurrent, in full remission with anxious distress ([place]) 11/23/2014  . Vertebral fracture, osteoporotic ([place]) 08/07/2015  . Anxiety     Past Surgical History  Procedure Laterality Date  . Breast surgery Left    Family History:  Family History  Problem Relation Age of Onset  . Arthritis Mother   . Arthritis Father   . Cancer Sister     brain  . Heart disease Sister   . Stroke Sister   . Diabetes Brother    Family Psychiatric  History: Husband reports that there is no known family  history of any mental illness or substance abuse problems Social History:  History  Alcohol Use No     History  Drug Use No    Social History   Social History  . Marital Status: Married    Spouse Name: N/A  . Number of Children: N/A  . Years of Education: N/A   Social History Main Topics  . Smoking status: Current Every Day Smoker -- 1.00 packs/day for 45 years    Types: Cigarettes  . Smokeless tobacco: Current User  . Alcohol Use: No  . Drug Use: No  . Sexual Activity: No   Other Topics Concern  . None   Social History Narrative   Additional Social History:    Allergies:   Allergies  Allergen Reactions  . Ciprofloxacin Other (See Comments)    Reaction:  Unknown   . Inhaler Decongestant [Methamphetamine] Other (See Comments)    Reaction:  Unknown   . Molds & Smuts Other (See Comments)    Reaction:  Wheezing/itchy,watery eyes   . Albuterol Sulfate Palpitations and Other (See Comments)    Pt states that she is allergic to the [facility] brand only.      Labs:  Results for  orders placed or performed during the hospital encounter of 08/21/15 (from the past 48 hour(s))  Blood Culture (routine x 2)     Status: None (Preliminary result)   Collection Time: 08/21/15  7:15 PM  Result Value Ref Range   Specimen Description BLOOD RIGHT HAND    Special Requests BOTTLES DRAWN AEROBIC AND ANAEROBIC  1CC    Culture NO GROWTH < 24 HOURS    Report Status PENDING   Lactic acid, plasma     Status: None   Collection Time: 08/21/15  7:32 PM  Result Value Ref Range   Lactic Acid, Venous 1.9 0.5 - 2.0 mmol/L  Comprehensive metabolic panel     Status: Abnormal   Collection Time: 08/21/15  7:32 PM  Result Value Ref Range   Sodium 136 135 - 145 mmol/L   Potassium 3.5 3.5 - 5.1 mmol/L   Chloride 99 (L) 101 - 111 mmol/L   CO2 24 22 - 32 mmol/L   Glucose, Bld 207 (H) 65 - 99 mg/dL   BUN 13 6 - 20 mg/dL   Creatinine, Ser 0.48 0.44 - 1.00 mg/dL   Calcium 9.8 8.9 - 10.3 mg/dL   Total  Protein 7.4 6.5 - 8.1 g/dL   Albumin 3.8 3.5 - 5.0 g/dL   AST 20 15 - 41 U/L   ALT 14 14 - 54 U/L   Alkaline Phosphatase 106 38 - 126 U/L   Total Bilirubin <0.1 (L) 0.3 - 1.2 mg/dL   GFR calc non Af Amer >60 >60 mL/min   GFR calc Af Amer >60 >60 mL/min    Comment: (NOTE) The eGFR has been calculated using the CKD EPI equation. This calculation has not been validated in all clinical situations. eGFR's persistently <60 mL/min signify possible Chronic Kidney Disease.    Anion gap 13 5 - 15  Troponin I     Status: Abnormal   Collection Time: 08/21/15  7:32 PM  Result Value Ref Range   Troponin I 0.05 (H) <0.031 ng/mL    Comment: READ BACK AND VERIFIED WITH BUTCH  WOODS AT 2030 08/21/15 SDR        PERSISTENTLY INCREASED TROPONIN VALUES IN THE RANGE OF 0.04-0.49 ng/mL CAN BE SEEN IN:       -UNSTABLE ANGINA       -CONGESTIVE HEART FAILURE       -MYOCARDITIS       -CHEST TRAUMA       -ARRYHTHMIAS       -LATE PRESENTING MYOCARDIAL INFARCTION       -COPD   CLINICAL FOLLOW-UP RECOMMENDED.   CBC WITH DIFFERENTIAL     Status: Abnormal   Collection Time: 08/21/15  7:32 PM  Result Value Ref Range   WBC 9.9 3.6 - 11.0 K/uL   RBC 4.05 3.80 - 5.20 MIL/uL   Hemoglobin 10.7 (L) 12.0 - 16.0 g/dL   HCT 32.6 (L) 35.0 - 47.0 %   MCV 80.5 80.0 - 100.0 fL   MCH 26.4 26.0 - 34.0 pg   MCHC 32.9 32.0 - 36.0 g/dL   RDW 17.4 (H) 11.5 - 14.5 %   Platelets 362 150 - 440 K/uL   Neutrophils Relative % 81 %   Neutro Abs 8.1 (H) 1.4 - 6.5 K/uL   Lymphocytes Relative 10 %   Lymphs Abs 1.0 1.0 - 3.6 K/uL   Monocytes Relative 7 %   Monocytes Absolute 0.6 0.2 - 0.9 K/uL   Eosinophils Relative 1 %   Eosinophils Absolute  0.1 0 - 0.7 K/uL   Basophils Relative 1 %   Basophils Absolute 0.0 0 - 0.1 K/uL  Blood Culture (routine x 2)     Status: None (Preliminary result)   Collection Time: 08/21/15  7:32 PM  Result Value Ref Range   Specimen Description BLOOD RIGHT ASSIST CONTROL    Special Requests       BOTTLES DRAWN AEROBIC AND ANAEROBIC  AERO [place] ANA 10CC   Culture NO GROWTH < 24 HOURS    Report Status PENDING   Urinalysis complete, with microscopic ([place] only)     Status: Abnormal   Collection Time: 08/21/15  8:05 PM  Result Value Ref Range   Color, Urine YELLOW (A) YELLOW   APPearance CLEAR (A) CLEAR   Glucose, UA NEGATIVE NEGATIVE mg/dL   Bilirubin Urine NEGATIVE NEGATIVE   Ketones, ur NEGATIVE NEGATIVE mg/dL   Specific Gravity, Urine 1.012 1.005 - 1.030   Hgb urine dipstick 1+ (A) NEGATIVE   pH 6.0 5.0 - 8.0   Protein, ur NEGATIVE NEGATIVE mg/dL   Nitrite NEGATIVE NEGATIVE   Leukocytes, UA TRACE (A) NEGATIVE   RBC / HPF 0-5 0 - 5 RBC/hpf   WBC, UA 6-30 0 - 5 WBC/hpf   Bacteria, UA NONE SEEN NONE SEEN   Squamous Epithelial / LPF 0-5 (A) NONE SEEN   Mucous PRESENT   Urine culture     Status: None (Preliminary result)   Collection Time: 08/21/15  8:05 PM  Result Value Ref Range   Specimen Description URINE, RANDOM    Special Requests NONE    Culture NO GROWTH < 12 HOURS    Report Status PENDING   Lactic acid, plasma     Status: None   Collection Time: 08/21/15 10:48 PM  Result Value Ref Range   Lactic Acid, Venous 1.2 0.5 - 2.0 mmol/L  Glucose, capillary     Status: Abnormal   Collection Time: 08/22/15 12:32 AM  Result Value Ref Range   Glucose-Capillary 153 (H) 65 - 99 mg/dL  Troponin I     Status: None   Collection Time: 08/22/15  5:59 AM  Result Value Ref Range   Troponin I <0.03 <0.031 ng/mL    Comment:        NO INDICATION OF MYOCARDIAL INJURY.   Basic metabolic panel     Status: Abnormal   Collection Time: 08/22/15  5:59 AM  Result Value Ref Range   Sodium 138 135 - 145 mmol/L   Potassium 3.0 (L) 3.5 - 5.1 mmol/L   Chloride 99 (L) 101 - 111 mmol/L   CO2 24 22 - 32 mmol/L   Glucose, Bld 165 (H) 65 - 99 mg/dL   BUN 7 6 - 20 mg/dL   Creatinine, Ser 0.47 0.44 - 1.00 mg/dL   Calcium 9.2 8.9 - 10.3 mg/dL   GFR calc non Af Amer >60 >60 mL/min   GFR calc Af  Amer >60 >60 mL/min    Comment: (NOTE) The eGFR has been calculated using the CKD EPI equation. This calculation has not been validated in all clinical situations. eGFR's persistently <60 mL/min signify possible Chronic Kidney Disease.    Anion gap 15 5 - 15  CBC     Status: Abnormal   Collection Time: 08/22/15  5:59 AM  Result Value Ref Range   WBC 6.8 3.6 - 11.0 K/uL   RBC 4.09 3.80 - 5.20 MIL/uL   Hemoglobin 10.8 (L) 12.0 - 16.0 g/dL   HCT 32.9 (  L) 35.0 - 47.0 %   MCV 80.5 80.0 - 100.0 fL   MCH 26.4 26.0 - 34.0 pg   MCHC 32.8 32.0 - 36.0 g/dL   RDW 17.1 (H) 11.5 - 14.5 %   Platelets 354 150 - 440 K/uL  Glucose, capillary     Status: Abnormal   Collection Time: 08/22/15  7:35 AM  Result Value Ref Range   Glucose-Capillary 204 (H) 65 - 99 mg/dL   Comment 1 Notify RN   Troponin I     Status: None   Collection Time: 08/22/15 11:50 AM  Result Value Ref Range   Troponin I <0.03 <0.031 ng/mL    Comment:        NO INDICATION OF MYOCARDIAL INJURY.   Magnesium     Status: Abnormal   Collection Time: 08/22/15 11:50 AM  Result Value Ref Range   Magnesium 1.3 (L) 1.7 - 2.4 mg/dL    Current Facility-Administered Medications  Medication Dose Route Frequency Provider Last Rate Last Dose  . acetaminophen (TYLENOL) tablet 650 mg  650 mg Oral Q6H PRN Lance Coon, MD       Or  . acetaminophen (TYLENOL) suppository 650 mg  650 mg Rectal Q6H PRN Lance Coon, MD      . antiseptic oral rinse (CPC / CETYLPYRIDINIUM CHLORIDE 0.05%) solution 7 mL  7 mL Mouth Rinse BID Lance Coon, MD   7 mL at 08/22/15 0408  . aspirin EC tablet 81 mg  81 mg Oral QHS Lance Coon, MD   81 mg at 08/22/15 0033  . bisacodyl (DULCOLAX) EC tablet 5 mg  5 mg Oral Daily PRN Lance Coon, MD      . cephALEXin [facility]) capsule 500 mg  500 mg Oral Q12H Fritzi Mandes, MD   Stopped at 08/22/15 1000  . cloNIDine (CATAPRES) tablet 0.1 mg  0.1 mg Oral BID Lance Coon, MD   0.1 mg at 08/22/15 0847  . diazepam (VALIUM)  tablet 5 mg  5 mg Oral Q8H PRN Lance Coon, MD   5 mg at 08/22/15 0847  . docusate sodium (COLACE) capsule 100 mg  100 mg Oral BID Lance Coon, MD   100 mg at 08/22/15 0848  . donepezil (ARICEPT) tablet 5 mg  5 mg Oral QHS Lance Coon, MD   5 mg at 08/22/15 0033  . enoxaparin (LOVENOX) injection 40 mg  40 mg Subcutaneous Q24H Lance Coon, MD      . furosemide (LASIX) tablet 20 mg  20 mg Oral Brigid Re, MD   20 mg at 08/22/15 0700  . HYDROcodone-acetaminophen (NORCO/VICODIN) 5-325 MG per tablet 1 tablet  1 tablet Oral BID AC Gonzella Lex, MD      . insulin aspart (novoLOG) injection 0-5 Units  0-5 Units Subcutaneous QHS Lance Coon, MD   0 Units at 08/22/15 0035  . insulin aspart (novoLOG) injection 0-9 Units  0-9 Units Subcutaneous TID WC Lance Coon, MD   3 Units at 08/22/15 0848  . loratadine (CLARITIN) tablet 10 mg  10 mg Oral Daily Lance Coon, MD   10 mg at 08/22/15 0847  . montelukast (SINGULAIR) tablet 10 mg  10 mg Oral QHS Lance Coon, MD   10 mg at 08/22/15 0033  . ondansetron (ZOFRAN) tablet 4 mg  4 mg Oral Q6H PRN Lance Coon, MD       Or  . ondansetron [facility]) injection 4 mg  4 mg Intravenous Q6H PRN Lance Coon, MD      .  potassium chloride (KLOR-CON) packet 20 mEq  20 mEq Oral [address] Wilson, RPH   20 mEq at 08/22/15 1130  . pravastatin (PRAVACHOL) tablet 20 mg  20 mg Oral q1800 Lance Coon, MD      . risperiDONE (RISPERDAL M-TABS) disintegrating tablet 0.25 mg  0.25 mg Oral BID Gonzella Lex, MD      . sodium chloride flush (NS) 0.9 % injection 3 mL  3 mL Intravenous Q12H Lance Coon, MD   3 mL at 08/22/15 0015  . theophylline (UNIPHYL) 400 MG 24 hr tablet 200 mg  200 mg Oral BID Lance Coon, MD   200 mg at 08/22/15 0244  . tiotropium (SPIRIVA) inhalation capsule 18 mcg  18 mcg Inhalation QHS Lance Coon, MD   18 mcg at 08/22/15 0242    Musculoskeletal: Strength & Muscle Tone: decreased Gait & Station: unsteady Patient leans: Front  Psychiatric  Specialty Exam: Review of Systems  Unable to perform ROS: patient unresponsive  Musculoskeletal: Negative for back pain and neck pain.    Blood pressure 134/85, pulse 105, temperature 97.7 F (36.5 C), temperature source Oral, resp. rate 20, height _0  (1.651 m), weight 74.889 kg (165 lb 1.6 oz), SpO2 91 %.Body mass index is 27.47 kg/(m^2).  General Appearance: Disheveled  Eye Contact::  Minimal  Speech:  Slow  Volume:  Decreased  Mood:  Wouldn't answer  Affect:  I saw her display irritable affect with her husband and staff. Earlier today she was striking out at caregivers  Thought Process:  She did not say enough for me to have any clear idea about her thought process. I was not able to ask her about any paranoia  Orientation:  Negative  Thought Content:  Negative  Suicidal Thoughts:  No  Homicidal Thoughts:  No  Memory:  Negative  Judgement:  Negative  Insight:  Negative  Psychomotor Activity:  Decreased  Concentration:  Negative  Recall:  Negative  Fund of Knowledge:Negative  Language: Negative  Akathisia:  Negative  Handed:  Right  AIMS (if indicated):     Assets:  Housing Social Support  ADL's:  Impaired  Cognition: Impaired,  Moderate  Sleep:      Treatment Plan Summary: Daily contact with patient to assess and evaluate symptoms and progress in treatment, Medication management and Plan 82 year old woman with a history of chronic dementia presents with a subacute presentation of some psychotic symptoms now with worsening paranoia and agitated behavior and delirium over the last day. Several possible causes. CT shows diffuse white matter disease indicating she has had a lot of little strokes over time in probably a significant part of her dementia is vascular. It could be that a new small infarct or injury simply worsened her symptoms acutely. Also it is just a normal part of the process that dementing illnesses will present with psychotic symptoms at times. Worsening  symptoms in the last day could be due to a urinary tract infection and sepsis. I also wonder whether the change in pain medicine could've played any role with her perhaps having a different reaction to codeine then she had to hydrocodone. I am going to change her pain medicine by reinstating Norco 5 mg but only at twice a day because she did tell me that she wasn't having much pain right now. She takes the diazepam regularly as an outpatient so I will leave that in place even though chronic benzodiazepines could actually worsen some of the confusion in a person with  dementia. I would suggest trying to if possible avoid benzodiazepines for further agitation and I will add an order for when necessary doses of intravenous haloperidol for agitation. I am also going to add a quarter milligram of Risperdal orally twice a day which hopefully could prove a longer term solution one she is ready for discharge. Discontinue the citalopram because as mentioned above she was not taking it as an outpatient. Described all of this to the husband. I will follow-up.  Disposition: Patient does not meet criteria for psychiatric inpatient admission. Medication treatment and hospital management as above.  Alethia Berthold, MD 08/22/2015 3:08 PM

## 2015-08-22 NOTE — Progress Notes (Addendum)
Patient ID: ROSELLEN HILLESTAD, female   DOB: January 18, 1932, 82 y.o.   MRN: [id number] [facility] [place] NAME: Ezgi Brusso    MR#:  [id number]  DATE OF BIRTH:  12/06/1931  SUBJECTIVE:  Irritable, code 300 called  REVIEW OF SYSTEMS:   Review of Systems  Unable to perform ROS: mental acuity   Tolerating Diet:yes Tolerating PT: able to walk by herself  DRUG ALLERGIES:   Allergies  Allergen Reactions  . Ciprofloxacin Other (See Comments)    Reaction:  Unknown   . Inhaler Decongestant [Methamphetamine] Other (See Comments)    Reaction:  Unknown   . Molds & Smuts Other (See Comments)    Reaction:  Wheezing/itchy,watery eyes   . Albuterol Sulfate Palpitations and Other (See Comments)    Pt states that she is allergic to the [facility] brand only.      VITALS:  Blood pressure 134/85, pulse 105, temperature 97.7 F (36.5 C), temperature source Oral, resp. rate 20, height 5\' 5"  (1.651 m), weight 74.889 kg (165 lb 1.6 oz), SpO2 91 %.  PHYSICAL EXAMINATION:   Physical Exam  GENERAL:  82 y.o.-year-old patient lying in the bed with no acute distress.  EYES: Pupils equal, round, reactive to light and accommodation. No scleral icterus. Extraocular muscles intact.  HEENT: Head atraumatic, normocephalic. Oropharynx and nasopharynx clear.  NECK:  Supple, no jugular venous distention. No thyroid enlargement, no tenderness.  LUNGS: Normal breath sounds bilaterally, no wheezing, rales, rhonchi. No use of accessory muscles of respiration.  CARDIOVASCULAR: S1, S2 normal. No murmurs, rubs, or gallops.  ABDOMEN: Soft, nontender, nondistended. Bowel sounds present. No organomegaly or mass.  EXTREMITIES: No cyanosis, clubbing or edema b/l.    NEUROLOGIC: Cranial nerves II through XII are intact. No focal Motor or sensory deficits b/l.   PSYCHIATRIC:  patient is alert and oriented x 3.  SKIN: No obvious rash, lesion, or ulcer.   LABORATORY PANEL:   CBC  Recent Labs Lab 08/22/15 0559  WBC 6.8  HGB 10.8*  HCT 32.9*  PLT 354    Chemistries   Recent Labs Lab 08/21/15 1932 08/22/15 0559 08/22/15 1150  NA 136 138  --   K 3.5 3.0*  --   CL 99* 99*  --   CO2 24 24  --   GLUCOSE 207* 165*  --   BUN 13 7  --   CREATININE 0.48 0.47  --   CALCIUM 9.8 9.2  --   MG  --   --  1.3*  AST 20  --   --   ALT 14  --   --   ALKPHOS 106  --   --   BILITOT <0.1*  --   --    Cardiac Enzymes  Recent Labs Lab 08/22/15 1150  TROPONINI <0.03   RADIOLOGY:  Ct Head Wo Contrast  08/22/2015  CLINICAL DATA:  82 year old female with confusion. Paranoia. Altered mental status. Febrile and tachycardic. EXAM: CT HEAD WITHOUT CONTRAST TECHNIQUE: Contiguous axial images were obtained from the base of the skull through the vertex without intravenous contrast. COMPARISON:  No priors. FINDINGS: Mild cerebral atrophy. Patchy and confluent areas of decreased attenuation are noted throughout the deep and periventricular white matter of the cerebral hemispheres bilaterally, compatible with chronic microvascular ischemic disease. Well-defined areas of low attenuation are noted in the head of the caudate nuclei bilaterally, compatible with old lacunar infarcts. No acute intracranial abnormalities. Specifically, no evidence of acute  intracranial hemorrhage, no definite findings of acute/subacute cerebral ischemia, no mass, mass effect, hydrocephalus or abnormal intra or extra-axial fluid collections. Visualized paranasal sinuses and mastoids are well pneumatized. No acute displaced skull fractures are identified. IMPRESSION: 1. No acute intracranial abnormalities. 2. Mild cerebral atrophy with extensive chronic microvascular ischemic changes in the cerebral white matter and old lacunar infarcts in the basal ganglia bilaterally, as above. Electronically Signed   By: Vinnie Langton M.D.   On: 08/22/2015 12:45   Dg Chest Port 1 View  08/21/2015  CLINICAL DATA:   Altered mental status. Intermittent confusion for 2 days. EXAM: PORTABLE CHEST 1 VIEW COMPARISON:  Radiographs 07/25/2015, CT 07/23/2015 FINDINGS: Cardiomediastinal contours are unchanged with aortic atherosclerosis. The lungs are hyperinflated with emphysema. The small right lower lobe pulmonary nodule on prior CT is not visualized. Calcifications projecting over the lower right hemithorax within the right breast. Bibasilar atelectasis and left basilar scarring, unchanged. No pleural effusion or pneumothorax bones are under mineralized. IMPRESSION: Chronic lung disease with bibasilar atelectasis Electronically Signed   By: Jeb Levering M.D.   On: 08/21/2015 19:54   ASSESSMENT AND PLAN:  *Magaly Derrington is a 82 y.o. female who presents with confusion. Patient and her husband state that earlier today she was acutely confused, became paranoid feeling like somebody was trying to kill her, worried that her husband was trying to give her medication to harm her. Patient provides some accurate information in history, though she is oftentimes also speaking in a confused way, with comments that are wondering or distracted or not coherent  1. Acute delirium with psychosis -multifactorial UTI/Sepsis, change in pain meds-recent, worsening normal dementia, ?cva -prn haldol -on risperdal per dr clapacs-appreciate input -CT head no acute cva but old strokes suggest possible vascular dementia  2. UTI/sepsis -IV antibiotics -f/u cultures  3. Tobacco abuse/chronic -did counselling for about 4 mins. Pt currently irritable. Spoke with husband  4.Dm-2 ssi  5. HTN Po meds  CSW for d/c planning   Case discussed with Care Management/Social Worker. Management plans discussed with the patient, family and they are in agreement.  CODE STATUS: full  DVT Prophylaxis: loveonx  TOTAL TIME TAKING CARE OF THIS PATIENT: *35* minutes.  >50% time spent on counselling and coordination of care  POSSIBLE D/C IN  2-3 DAYS, DEPENDING ON CLINICAL CONDITION.  Note: This dictation was prepared with Dragon dictation along with smaller phrase technology. Any transcriptional errors that result from this process are unintentional.  Jeramie Scogin M.D on 08/22/2015 at 4:18 PM  Between 7am to 6pm - Pager - [phone]  After 6pm go to [email or web address] - password [email or web address] [facility]  Office  [phone]  CC: Primary care physician; Bobetta Lime, MD

## 2015-08-22 NOTE — Progress Notes (Signed)
Pt became agitated and combative, removed IV, pulse ox, and oxygen. Code 300 called. MD notified, given order for IV Ativan x1. Pt now back in bed with oxygen, tele, and pulse ox back on. Bed alarm on and family at bedside. Report given to oncoming RN.

## 2015-08-22 NOTE — Progress Notes (Signed)
Pt resting after receiving ativan x2, haldol x1, and valium x1. Pt has been agitated, impulsive, and combative. Verbal order from Dr. Posey Pronto to not take CBG check and hold off on insulin. Allow patient sometime to relax and rest before CT scan today.

## 2015-08-23 LAB — URINE CULTURE

## 2015-08-23 MED ORDER — HALOPERIDOL LACTATE 5 MG/ML IJ SOLN
5.0000 mg | INTRAMUSCULAR | Status: DC | PRN
  Administered 2015-08-23: 5 mg via INTRAMUSCULAR
  Filled 2015-08-23: qty 1

## 2015-08-23 MED ORDER — CEPHALEXIN 500 MG PO CAPS: 500.0000 mg | ORAL_CAPSULE | Freq: Two times a day (BID) | ORAL | Status: DC

## 2015-08-23 MED ORDER — MAGNESIUM OXIDE 400 (241.3 MG) MG PO TABS
400.0000 mg | ORAL_TABLET | Freq: Two times a day (BID) | ORAL | Status: DC
  Administered 2015-08-23: 400 mg via ORAL
  Filled 2015-08-23: qty 1

## 2015-08-23 MED ORDER — MAGNESIUM OXIDE 400 (241.3 MG) MG PO TABS: 400.0000 mg | ORAL_TABLET | Freq: Two times a day (BID) | ORAL | Status: DC

## 2015-08-23 MED ORDER — RISPERIDONE 0.25 MG PO TBDP: 0.2500 mg | ORAL_TABLET | Freq: Two times a day (BID) | ORAL | Status: DC

## 2015-08-23 NOTE — Care Management Important Message (Signed)
Important Message  Patient Details  Name: Jocelyn Burgess MRN: [id number] Date of Birth: June 20, 1931   Medicare Important Message Given:  Yes    Juliann Pulse A Eriko Economos 08/23/2015, 9:56 AM

## 2015-08-23 NOTE — Progress Notes (Signed)
Pt has been offered medication on three separate occasions. Pt is refusing medication. Pt is disoriented to situation and become agitated when staff approaches her reporting that she just wants to sleep. Husband reports that she is more confused than usual and he is also unsuccessful in getting her to take medication.

## 2015-08-23 NOTE — Progress Notes (Signed)
Pt d/c to home today.  Rx's given to pt w/all questions and concerns addressed.  D/C paperwork reviewed and education provided with all questions and concerns addressed.  Pt husband at bedside for home transport.  Volunteer services contacted for transportation from room to exit.

## 2015-08-23 NOTE — Care Management (Signed)
Plan for patient to discharge today.  Patient lives at home with her husband, and presented with AMS.  Hx of dementia. Husband is primary caregiver.  Provides transportation and assists with ADL's.  [facility] is [facility] with [facility] for PT and Nursing.  Resumption orders have been placed as well SW and aide.  Corene Cornea with [facility] has been notified of pending discharge and changes in orders.  Husband states that there are no family members for support.  I have offered PCS list.   Husband declined.  RNCM singing off.

## 2015-08-23 NOTE — Discharge Summary (Signed)
[facility] [place] NAME: Meckenzie Kellog    MR#:  [id number]  DATE OF BIRTH:  March 24, 1932  DATE OF ADMISSION:  08/21/2015 ADMITTING PHYSICIAN: Lance Coon, MD  DATE OF DISCHARGE: 08/23/15  PRIMARY CARE PHYSICIAN: Bobetta Lime, MD    ADMISSION DIAGNOSIS:  UTI (lower urinary tract infection) [N39.0] Elevated troponin [R79.89] SIRS (systemic inflammatory response syndrome) (HCC) [R65.10]  DISCHARGE DIAGNOSIS:  Progressive Dementia Asymptomatic UTI Chronic COPD with ongoing tobacco abuse  SECONDARY DIAGNOSIS:   Past Medical History  Diagnosis Date  . Allergy   . Cancer ([place])   . Diabetes mellitus without complication ([place])   . Arthritis   . Asthma   . Cataract   . COPD (chronic obstructive pulmonary disease) ([place])   . Depression     sometimes  . Hyperlipidemia   . Hypertension   . Oxygen deficiency   . Major depressive disorder, recurrent, in full remission with anxious distress ([place]) 11/23/2014  . Vertebral fracture, osteoporotic ([place]) 08/07/2015  . Anxiety     HOSPITAL COURSE:  Phylliss Manieri is a 82 y.o. female who presents with confusion. Patient and her husband state that earlier today she was acutely confused, became paranoid feeling like somebody was trying to kill her, worried that her husband was trying to give her medication to harm her. Patient provides some accurate information in history, though she is oftentimes also speaking in a confused way, with comments that are wondering or distracted or not coherent  1. Acute delirium with psychosis-pt much more setteld down today -multifactorial UTI, change in pain meds-recent, worsening normal dementia -prn haldol -on risperdal per dr clapacs-appreciate input -CT head no acute cva but old strokes suggest possible vascular dementia -sepsis resolved UC and BC negative  2. UTI -Ipo keflex  3. Tobacco abuse/chronic -did counselling for about 4 mins. Pt  currently irritable. Spoke with husband  4.Dm-2 ssi  5. HTN Po meds  Spoke with husband at length ans explained this is likely due to progressive dementia Will arrang HH PT,RN, SW and aide Husband ok to take pt home D/w dr clapacs-agrees  CONSULTS OBTAINED:  Treatment Team:  Gonzella Lex, MD  DRUG ALLERGIES:   Allergies  Allergen Reactions  . Ciprofloxacin Other (See Comments)    Reaction:  Unknown   . Inhaler Decongestant [Methamphetamine] Other (See Comments)    Reaction:  Unknown   . Molds & Smuts Other (See Comments)    Reaction:  Wheezing/itchy,watery eyes   . Albuterol Sulfate Palpitations and Other (See Comments)    Pt states that she is allergic to the [facility] brand only.      DISCHARGE MEDICATIONS:   Current Discharge Medication List    START taking these medications   Details  cephALEXin (KEFLEX) 500 MG capsule Take 1 capsule (500 mg total) by mouth every 12 (twelve) hours. Qty: 6 capsule, Refills: 0    magnesium oxide (MAG-OX) 400 (241.3 Mg) MG tablet Take 1 tablet (400 mg total) by mouth 2 (two) times daily. Qty: 10 tablet, Refills: 0    risperiDONE 0.25 MG TBDP Take 1 tablet (0.25 mg total) by mouth 2 (two) times daily. Qty: 60 tablet, Refills: 2      CONTINUE these medications which have NOT CHANGED   Details  acetaminophen-codeine (TYLENOL #4) 300-60 MG tablet Take 1 tablet by mouth every 8 (eight) hours as needed for moderate pain. Qty: 90 tablet, Refills: 0   Associated Diagnoses: Vertebral fracture, osteoporotic,  sequela    albuterol (PROVENTIL HFA;VENTOLIN HFA) 108 (90 Base) MCG/ACT inhaler Inhale 1-2 puffs into the lungs every 4 (four) hours as needed for wheezing or shortness of breath. Qty: 1 Inhaler, Refills: 5   Associated Diagnoses: COPD, severe (HCC)    aspirin EC 81 MG tablet Take 81 mg by mouth at bedtime.    bisacodyl (BISACODYL) 5 MG EC tablet Take 1 tablet (5 mg total) by mouth daily as needed for moderate constipation. Qty:  30 tablet, Refills: 0    citalopram (CELEXA) 20 MG tablet Take 20 mg by mouth at bedtime.     cloNIDine (CATAPRES) 0.1 MG tablet Take 0.1 mg by mouth 2 (two) times daily.    diazepam (VALIUM) 5 MG tablet Take 1 tablet (5 mg total) by mouth every 8 (eight) hours as needed for anxiety. Qty: 270 tablet, Refills: 1   Associated Diagnoses: Major depressive disorder, recurrent, in full remission with anxious distress ([place]); Benign essential tremor    docusate sodium (COLACE) 100 MG capsule Take 1 capsule (100 mg total) by mouth 2 (two) times daily. Qty: 10 capsule, Refills: 0    donepezil (ARICEPT) 5 MG tablet Take 1 tablet (5 mg total) by mouth at bedtime. Qty: 30 tablet, Refills: 1   Associated Diagnoses: Dementia, without behavioral disturbance    furosemide (LASIX) 20 MG tablet Take 1 tablet (20 mg total) by mouth every morning. Qty: 90 tablet, Refills: 1    gabapentin (NEURONTIN) 300 MG capsule Take 300-600 mg by mouth 2 (two) times daily. Pt takes one capsule in the morning and two at bedtime.    gemfibrozil (LOPID) 600 MG tablet Take 600 mg by mouth 2 (two) times daily before a meal.    glimepiride (AMARYL) 4 MG tablet Take 4 mg by mouth 2 (two) times daily.     loratadine (CLARITIN) 10 MG tablet Take 10 mg by mouth daily.     lovastatin (MEVACOR) 20 MG tablet Take 20 mg by mouth at bedtime.    meclizine (ANTIVERT) 25 MG tablet Take 25 mg by mouth every 8 (eight) hours as needed for dizziness.    meloxicam (MOBIC) 15 MG tablet Take 1 tablet (15 mg total) by mouth daily. Qty: 90 tablet, Refills: 2   Associated Diagnoses: Morning headache    metFORMIN (GLUCOPHAGE) 1000 MG tablet Take 1 tablet (1,000 mg total) by mouth 2 (two) times daily with a meal. Qty: 180 tablet, Refills: 1   Associated Diagnoses: Controlled type 2 diabetes mellitus with complication, without long-term current use of insulin (HCC)    montelukast (SINGULAIR) 10 MG tablet Take 10 mg by mouth at bedtime.     potassium chloride (K-DUR) 10 MEQ tablet Take 10 mEq by mouth 2 (two) times daily.    theophylline (THEODUR) 200 MG 12 hr tablet Take 200 mg by mouth 2 (two) times daily.    tiotropium (SPIRIVA) 18 MCG inhalation capsule Place 18 mcg into inhaler and inhale at bedtime.    mometasone (ELOCON) 0.1 % lotion Apply 1 application topically as needed (for dry,itchy skin). Reported on 08/22/2015    nystatin (MYCOSTATIN) 100000 UNIT/ML suspension Take 5 mLs (500,000 Units total) by mouth 4 (four) times daily. Swish retain in mouth long as possible then swallow Qty: 120 mL, Refills: 0   Associated Diagnoses: Oral thrush      STOP taking these medications     theophylline (UNIPHYL) 400 MG 24 hr tablet         If you experience  worsening of your admission symptoms, develop shortness of breath, life threatening emergency, suicidal or homicidal thoughts you must seek medical attention immediately by calling 911 or calling your MD immediately  if symptoms less severe.  You Must read complete instructions/literature along with all the possible adverse reactions/side effects for all the Medicines you take and that have been prescribed to you. Take any new Medicines after you have completely understood and accept all the possible adverse reactions/side effects.   Please note  You were cared for by a hospitalist during your hospital stay. If you have any questions about your discharge medications or the care you received while you were in the hospital after you are discharged, you can call the unit and asked to speak with the hospitalist on call if the hospitalist that took care of you is not available. Once you are discharged, your primary care physician will handle any further medical issues. Please note that NO REFILLS for any discharge medications will be authorized once you are discharged, as it is imperative that you return to your primary care physician (or establish a relationship with a primary care  physician if you do not have one) for your aftercare needs so that they can reassess your need for medications and monitor your lab values. Today   SUBJECTIVE  Let me go home   VITAL SIGNS:  Blood pressure 146/53, pulse 92, temperature 98.3 F (36.8 C), temperature source Oral, resp. rate 20, height 5\' 5"  (1.651 m), weight 74.889 kg (165 lb 1.6 oz), SpO2 97 %.  I/O:   Intake/Output Summary (Last 24 hours) at 08/23/15 0904 Last data filed at 08/23/15 0618  Gross per 24 hour  Intake    720 ml  Output      0 ml  Net    720 ml    PHYSICAL EXAMINATION:  GENERAL:  82 y.o.-year-old patient lying in the bed with no acute distress.  EYES: Pupils equal, round, reactive to light and accommodation. No scleral icterus. Extraocular muscles intact.  HEENT: Head atraumatic, normocephalic. Oropharynx and nasopharynx clear.  NECK:  Supple, no jugular venous distention. No thyroid enlargement, no tenderness.  LUNGS: Normal breath sounds bilaterally, no wheezing, rales,rhonchi or crepitation. No use of accessory muscles of respiration.  CARDIOVASCULAR: S1, S2 normal. No murmurs, rubs, or gallops.  ABDOMEN: Soft, non-tender, non-distended. Bowel sounds present. No organomegaly or mass.  EXTREMITIES: No pedal edema, cyanosis, or clubbing.  NEUROLOGIC: Cranial nerves II through XII are intact. Muscle strength 5/5 in all extremities. Sensation intact. Gait not checked.  PSYCHIATRIC: The patient is alert and oriented x 3.  SKIN: No obvious rash, lesion, or ulcer.   DATA REVIEW:   CBC   Recent Labs Lab 08/22/15 0559  WBC 6.8  HGB 10.8*  HCT 32.9*  PLT 354    Chemistries   Recent Labs Lab 08/21/15 1932 08/22/15 0559 08/22/15 1150  NA 136 138  --   K 3.5 3.0*  --   CL 99* 99*  --   CO2 24 24  --   GLUCOSE 207* 165*  --   BUN 13 7  --   CREATININE 0.48 0.47  --   CALCIUM 9.8 9.2  --   MG  --   --  1.3*  AST 20  --   --   ALT 14  --   --   ALKPHOS 106  --   --   BILITOT <0.1*  --    --  Microbiology Results   Recent Results (from the past 240 hour(s))  Blood Culture (routine x 2)     Status: None (Preliminary result)   Collection Time: 08/21/15  7:15 PM  Result Value Ref Range Status   Specimen Description BLOOD RIGHT HAND  Final   Special Requests BOTTLES DRAWN AEROBIC AND ANAEROBIC  1CC  Final   Culture NO GROWTH < 24 HOURS  Final   Report Status PENDING  Incomplete  Blood Culture (routine x 2)     Status: None (Preliminary result)   Collection Time: 08/21/15  7:32 PM  Result Value Ref Range Status   Specimen Description BLOOD RIGHT ASSIST CONTROL  Final   Special Requests   Final    BOTTLES DRAWN AEROBIC AND ANAEROBIC  AERO [place] ANA 10CC   Culture NO GROWTH < 24 HOURS  Final   Report Status PENDING  Incomplete  Urine culture     Status: None   Collection Time: 08/21/15  8:05 PM  Result Value Ref Range Status   Specimen Description URINE, RANDOM  Final   Special Requests NONE  Final   Culture 5,000 COLONIES/mL INSIGNIFICANT GROWTH  Final   Report Status 08/23/2015 FINAL  Final    RADIOLOGY:  Ct Head Wo Contrast  08/22/2015  CLINICAL DATA:  82 year old female with confusion. Paranoia. Altered mental status. Febrile and tachycardic. EXAM: CT HEAD WITHOUT CONTRAST TECHNIQUE: Contiguous axial images were obtained from the base of the skull through the vertex without intravenous contrast. COMPARISON:  No priors. FINDINGS: Mild cerebral atrophy. Patchy and confluent areas of decreased attenuation are noted throughout the deep and periventricular white matter of the cerebral hemispheres bilaterally, compatible with chronic microvascular ischemic disease. Well-defined areas of low attenuation are noted in the head of the caudate nuclei bilaterally, compatible with old lacunar infarcts. No acute intracranial abnormalities. Specifically, no evidence of acute intracranial hemorrhage, no definite findings of acute/subacute cerebral ischemia, no mass, mass effect,  hydrocephalus or abnormal intra or extra-axial fluid collections. Visualized paranasal sinuses and mastoids are well pneumatized. No acute displaced skull fractures are identified. IMPRESSION: 1. No acute intracranial abnormalities. 2. Mild cerebral atrophy with extensive chronic microvascular ischemic changes in the cerebral white matter and old lacunar infarcts in the basal ganglia bilaterally, as above. Electronically Signed   By: Vinnie Langton M.D.   On: 08/22/2015 12:45   Dg Chest Port 1 View  08/21/2015  CLINICAL DATA:  Altered mental status. Intermittent confusion for 2 days. EXAM: PORTABLE CHEST 1 VIEW COMPARISON:  Radiographs 07/25/2015, CT 07/23/2015 FINDINGS: Cardiomediastinal contours are unchanged with aortic atherosclerosis. The lungs are hyperinflated with emphysema. The small right lower lobe pulmonary nodule on prior CT is not visualized. Calcifications projecting over the lower right hemithorax within the right breast. Bibasilar atelectasis and left basilar scarring, unchanged. No pleural effusion or pneumothorax bones are under mineralized. IMPRESSION: Chronic lung disease with bibasilar atelectasis Electronically Signed   By: Jeb Levering M.D.   On: 08/21/2015 19:54     Management plans discussed with the patient, family and they are in agreement.  CODE STATUS:     Code Status Orders        Start     Ordered   08/21/15 2347  Full code   Continuous     08/21/15 2346    Code Status History    Date Active Date Inactive Code Status Order ID Comments User Context   07/21/2015 10:05 PM 07/26/2015  4:22 PM Full Code [id number]  Dellis Filbert  Lennice Sites, MD Inpatient      TOTAL TIME TAKING CARE OF THIS PATIENT: 40 minutes.    Francessca Friis M.D on 08/23/2015 at 9:04 AM  Between 7am to 6pm - Pager - [phone] After 6pm go to [email or web address] - password [email or web address]  Office  [phone]  CC: Primary care physician; Bobetta Lime, MD

## 2015-08-24 ENCOUNTER — Telehealth: Payer: Self-pay | Admitting: Family Medicine

## 2015-08-24 NOTE — Telephone Encounter (Signed)
Patient was just released from the hospital and Angie from [facility] is requesting a verbal order to give the patient care. (p) [phone]

## 2015-08-25 ENCOUNTER — Emergency Department
Admission: EM | Admit: 2015-08-25 | Discharge: 2015-08-27 | Disposition: A | Discharge status: Other Acute Inpatient Hospital | Payer: Medicare Other | Attending: Student | Admitting: Student

## 2015-08-25 DIAGNOSIS — Z7982 Long term (current) use of aspirin: Secondary | ICD-10-CM | POA: Insufficient documentation

## 2015-08-25 DIAGNOSIS — F1310 Sedative, hypnotic or anxiolytic abuse, uncomplicated: Secondary | ICD-10-CM | POA: Diagnosis not present

## 2015-08-25 DIAGNOSIS — I10 Essential (primary) hypertension: Secondary | ICD-10-CM | POA: Diagnosis not present

## 2015-08-25 DIAGNOSIS — E119 Type 2 diabetes mellitus without complications: Secondary | ICD-10-CM | POA: Insufficient documentation

## 2015-08-25 DIAGNOSIS — Z792 Long term (current) use of antibiotics: Secondary | ICD-10-CM | POA: Insufficient documentation

## 2015-08-25 DIAGNOSIS — R000 Tachycardia, unspecified: Secondary | ICD-10-CM | POA: Insufficient documentation

## 2015-08-25 DIAGNOSIS — F1110 Opioid abuse, uncomplicated: Secondary | ICD-10-CM | POA: Insufficient documentation

## 2015-08-25 DIAGNOSIS — Z79899 Other long term (current) drug therapy: Secondary | ICD-10-CM | POA: Diagnosis not present

## 2015-08-25 DIAGNOSIS — R4182 Altered mental status, unspecified: Secondary | ICD-10-CM | POA: Diagnosis present

## 2015-08-25 DIAGNOSIS — F0391 Unspecified dementia with behavioral disturbance: Secondary | ICD-10-CM | POA: Insufficient documentation

## 2015-08-25 DIAGNOSIS — Z791 Long term (current) use of non-steroidal anti-inflammatories (NSAID): Secondary | ICD-10-CM | POA: Diagnosis not present

## 2015-08-25 DIAGNOSIS — Z9981 Dependence on supplemental oxygen: Secondary | ICD-10-CM | POA: Diagnosis not present

## 2015-08-25 DIAGNOSIS — F17210 Nicotine dependence, cigarettes, uncomplicated: Secondary | ICD-10-CM | POA: Diagnosis not present

## 2015-08-25 DIAGNOSIS — Z7951 Long term (current) use of inhaled steroids: Secondary | ICD-10-CM | POA: Insufficient documentation

## 2015-08-25 DIAGNOSIS — Z7984 Long term (current) use of oral hypoglycemic drugs: Secondary | ICD-10-CM | POA: Insufficient documentation

## 2015-08-26 ENCOUNTER — Encounter: Payer: Self-pay | Admitting: Emergency Medicine

## 2015-08-26 ENCOUNTER — Emergency Department: Payer: Medicare Other

## 2015-08-26 LAB — CBC WITH DIFFERENTIAL/PLATELET
BASOS ABS: 0 10*3/uL (ref 0–0.1)
BASOS PCT: 0 %
EOS ABS: 0.1 10*3/uL (ref 0–0.7)
EOS PCT: 1 %
HCT: 33.3 % — ABNORMAL LOW (ref 35.0–47.0)
Hemoglobin: 10.9 g/dL — ABNORMAL LOW (ref 12.0–16.0)
LYMPHS PCT: 11 %
Lymphs Abs: 1 10*3/uL (ref 1.0–3.6)
MCH: 25.9 pg — ABNORMAL LOW (ref 26.0–34.0)
MCHC: 32.6 g/dL (ref 32.0–36.0)
MCV: 79.5 fL — ABNORMAL LOW (ref 80.0–100.0)
Monocytes Absolute: 0.6 10*3/uL (ref 0.2–0.9)
Monocytes Relative: 7 %
Neutro Abs: 7.2 10*3/uL — ABNORMAL HIGH (ref 1.4–6.5)
Neutrophils Relative %: 81 %
PLATELETS: 388 10*3/uL (ref 150–440)
RBC: 4.19 MIL/uL (ref 3.80–5.20)
RDW: 17.3 % — ABNORMAL HIGH (ref 11.5–14.5)
WBC: 9 10*3/uL (ref 3.6–11.0)

## 2015-08-26 LAB — URINE DRUG SCREEN, QUALITATIVE (ARMC ONLY)
Amphetamines, Ur Screen: NOT DETECTED
BARBITURATES, UR SCREEN: NOT DETECTED
Benzodiazepine, Ur Scrn: POSITIVE — AB
CANNABINOID 50 NG, UR ~~LOC~~: NOT DETECTED
COCAINE METABOLITE, UR ~~LOC~~: NOT DETECTED
MDMA (Ecstasy)Ur Screen: NOT DETECTED
Methadone Scn, Ur: NOT DETECTED
Opiate, Ur Screen: POSITIVE — AB
PHENCYCLIDINE (PCP) UR S: NOT DETECTED
TRICYCLIC, UR SCREEN: NOT DETECTED

## 2015-08-26 LAB — URINALYSIS COMPLETE WITH MICROSCOPIC (ARMC ONLY)
BACTERIA UA: NONE SEEN
Bilirubin Urine: NEGATIVE
Glucose, UA: 150 mg/dL — AB
Hgb urine dipstick: NEGATIVE
Ketones, ur: NEGATIVE mg/dL
Leukocytes, UA: NEGATIVE
NITRITE: NEGATIVE
PROTEIN: NEGATIVE mg/dL
SPECIFIC GRAVITY, URINE: 1.01 (ref 1.005–1.030)
pH: 6 (ref 5.0–8.0)

## 2015-08-26 LAB — CULTURE, BLOOD (ROUTINE X 2)
CULTURE: NO GROWTH
Culture: NO GROWTH

## 2015-08-26 LAB — BASIC METABOLIC PANEL
ANION GAP: 13 (ref 5–15)
BUN: 14 mg/dL (ref 6–20)
CALCIUM: 10.1 mg/dL (ref 8.9–10.3)
CO2: 22 mmol/L (ref 22–32)
Chloride: 99 mmol/L — ABNORMAL LOW (ref 101–111)
Creatinine, Ser: 0.61 mg/dL (ref 0.44–1.00)
Glucose, Bld: 310 mg/dL — ABNORMAL HIGH (ref 65–99)
POTASSIUM: 3 mmol/L — AB (ref 3.5–5.1)
SODIUM: 134 mmol/L — AB (ref 135–145)

## 2015-08-26 LAB — GLUCOSE, CAPILLARY
GLUCOSE-CAPILLARY: 121 mg/dL — AB (ref 65–99)
Glucose-Capillary: 175 mg/dL — ABNORMAL HIGH (ref 65–99)
Glucose-Capillary: 57 mg/dL — ABNORMAL LOW (ref 65–99)

## 2015-08-26 LAB — ETHANOL

## 2015-08-26 LAB — ACETAMINOPHEN LEVEL: Acetaminophen (Tylenol), Serum: <10 ug/mL — ABNORMAL LOW (ref 10–30)

## 2015-08-26 LAB — SALICYLATE LEVEL

## 2015-08-26 MED ORDER — METFORMIN HCL 500 MG PO TABS
1000.0000 mg | ORAL_TABLET | Freq: Two times a day (BID) | ORAL | Status: DC
  Administered 2015-08-26 – 2015-08-27 (×3): 1000 mg via ORAL
  Filled 2015-08-26 (×3): qty 2

## 2015-08-26 MED ORDER — GLIMEPIRIDE 2 MG PO TABS
4.0000 mg | ORAL_TABLET | Freq: Two times a day (BID) | ORAL | Status: DC
  Administered 2015-08-26 – 2015-08-27 (×2): 4 mg via ORAL
  Filled 2015-08-26 (×2): qty 2

## 2015-08-26 MED ORDER — MAGNESIUM OXIDE 400 (241.3 MG) MG PO TABS
400.0000 mg | ORAL_TABLET | Freq: Two times a day (BID) | ORAL | Status: DC
  Administered 2015-08-26 – 2015-08-27 (×3): 400 mg via ORAL
  Filled 2015-08-26 (×3): qty 1

## 2015-08-26 MED ORDER — ACETAMINOPHEN-CODEINE #4 300-60 MG PO TABS: 1.0000 | ORAL_TABLET | Freq: Three times a day (TID) | ORAL | Status: DC | PRN

## 2015-08-26 MED ORDER — ALBUTEROL SULFATE HFA 108 (90 BASE) MCG/ACT IN AERS
1.0000 | INHALATION_SPRAY | RESPIRATORY_TRACT | Status: DC | PRN
  Filled 2015-08-26: qty 6.7

## 2015-08-26 MED ORDER — RISPERIDONE 0.5 MG PO TABS
ORAL_TABLET | ORAL | Status: AC
  Administered 2015-08-26: 0.25 mg
  Filled 2015-08-26: qty 1

## 2015-08-26 MED ORDER — MOMETASONE FUROATE 0.1 % EX SOLN: 1.0000 "application " | CUTANEOUS | Status: DC | PRN

## 2015-08-26 MED ORDER — GEMFIBROZIL 600 MG PO TABS
600.0000 mg | ORAL_TABLET | Freq: Two times a day (BID) | ORAL | Status: DC
  Administered 2015-08-26 – 2015-08-27 (×3): 600 mg via ORAL
  Filled 2015-08-26 (×5): qty 1

## 2015-08-26 MED ORDER — PRAVASTATIN SODIUM 40 MG PO TABS
20.0000 mg | ORAL_TABLET | Freq: Every day | ORAL | Status: DC
  Administered 2015-08-26: 20 mg via ORAL
  Filled 2015-08-26: qty 1

## 2015-08-26 MED ORDER — CEPHALEXIN 500 MG PO CAPS
500.0000 mg | ORAL_CAPSULE | Freq: Two times a day (BID) | ORAL | Status: DC
  Administered 2015-08-26 – 2015-08-27 (×3): 500 mg via ORAL
  Filled 2015-08-26 (×3): qty 1

## 2015-08-26 MED ORDER — POTASSIUM CHLORIDE ER 10 MEQ PO TBCR
10.0000 meq | EXTENDED_RELEASE_TABLET | Freq: Two times a day (BID) | ORAL | Status: DC
  Administered 2015-08-26 – 2015-08-27 (×3): 10 meq via ORAL
  Filled 2015-08-26 (×5): qty 1

## 2015-08-26 MED ORDER — MECLIZINE HCL 25 MG PO TABS: 25.0000 mg | ORAL_TABLET | Freq: Three times a day (TID) | ORAL | Status: DC | PRN

## 2015-08-26 MED ORDER — LORATADINE 10 MG PO TABS
10.0000 mg | ORAL_TABLET | Freq: Every day | ORAL | Status: DC
  Administered 2015-08-26 – 2015-08-27 (×2): 10 mg via ORAL
  Filled 2015-08-26 (×2): qty 1

## 2015-08-26 MED ORDER — MONTELUKAST SODIUM 10 MG PO TABS
10.0000 mg | ORAL_TABLET | Freq: Every day | ORAL | Status: DC
  Administered 2015-08-26: 10 mg via ORAL
  Filled 2015-08-26 (×2): qty 1

## 2015-08-26 MED ORDER — TIOTROPIUM BROMIDE MONOHYDRATE 18 MCG IN CAPS
18.0000 ug | ORAL_CAPSULE | Freq: Every day | RESPIRATORY_TRACT | Status: DC
  Administered 2015-08-26: 18 ug via RESPIRATORY_TRACT
  Filled 2015-08-26: qty 5

## 2015-08-26 MED ORDER — MELOXICAM 15 MG PO TABS
15.0000 mg | ORAL_TABLET | Freq: Every day | ORAL | Status: DC
  Administered 2015-08-26 – 2015-08-27 (×2): 15 mg via ORAL
  Filled 2015-08-26 (×2): qty 1

## 2015-08-26 MED ORDER — SODIUM CHLORIDE 0.9 % IV BOLUS (SEPSIS)
500.0000 mL | Freq: Once | INTRAVENOUS | Status: AC
  Administered 2015-08-26: 500 mL via INTRAVENOUS

## 2015-08-26 MED ORDER — NYSTATIN 100000 UNIT/ML MT SUSP
5.0000 mL | Freq: Four times a day (QID) | OROMUCOSAL | Status: DC
  Administered 2015-08-26 – 2015-08-27 (×4): 500000 [IU] via ORAL
  Filled 2015-08-26 (×9): qty 5

## 2015-08-26 MED ORDER — DONEPEZIL HCL 5 MG PO TABS
5.0000 mg | ORAL_TABLET | Freq: Every day | ORAL | Status: DC
  Administered 2015-08-26: 5 mg via ORAL
  Filled 2015-08-26: qty 1

## 2015-08-26 MED ORDER — DOCUSATE SODIUM 100 MG PO CAPS
100.0000 mg | ORAL_CAPSULE | Freq: Two times a day (BID) | ORAL | Status: DC
  Administered 2015-08-26 – 2015-08-27 (×3): 100 mg via ORAL
  Filled 2015-08-26 (×3): qty 1

## 2015-08-26 MED ORDER — CITALOPRAM HYDROBROMIDE 20 MG PO TABS
20.0000 mg | ORAL_TABLET | Freq: Every day | ORAL | Status: DC
  Administered 2015-08-26: 20 mg via ORAL
  Filled 2015-08-26: qty 1

## 2015-08-26 MED ORDER — DIAZEPAM 5 MG PO TABS
5.0000 mg | ORAL_TABLET | Freq: Three times a day (TID) | ORAL | Status: DC | PRN
  Administered 2015-08-26: 5 mg via ORAL
  Filled 2015-08-26: qty 1

## 2015-08-26 MED ORDER — POTASSIUM CHLORIDE CRYS ER 20 MEQ PO TBCR
EXTENDED_RELEASE_TABLET | ORAL | Status: AC
  Administered 2015-08-26: 10 meq via ORAL
  Filled 2015-08-26: qty 1

## 2015-08-26 MED ORDER — RISPERIDONE 0.5 MG PO TBDP
0.2500 mg | ORAL_TABLET | Freq: Two times a day (BID) | ORAL | Status: DC
  Administered 2015-08-26 – 2015-08-27 (×2): 0.25 mg via ORAL
  Filled 2015-08-26 (×3): qty 0.5

## 2015-08-26 MED ORDER — CLONIDINE HCL 0.1 MG PO TABS
0.1000 mg | ORAL_TABLET | Freq: Two times a day (BID) | ORAL | Status: DC
  Administered 2015-08-26 – 2015-08-27 (×3): 0.1 mg via ORAL
  Filled 2015-08-26 (×3): qty 1

## 2015-08-26 MED ORDER — GABAPENTIN 300 MG PO CAPS
300.0000 mg | ORAL_CAPSULE | Freq: Two times a day (BID) | ORAL | Status: DC
  Administered 2015-08-26 – 2015-08-27 (×3): 300 mg via ORAL
  Filled 2015-08-26 (×3): qty 1

## 2015-08-26 MED ORDER — ASPIRIN EC 81 MG PO TBEC
81.0000 mg | DELAYED_RELEASE_TABLET | Freq: Every day | ORAL | Status: DC
  Administered 2015-08-26: 81 mg via ORAL
  Filled 2015-08-26: qty 1

## 2015-08-26 MED ORDER — TRIAMCINOLONE ACETONIDE 0.1 % EX LOTN
TOPICAL_LOTION | CUTANEOUS | Status: DC | PRN
  Filled 2015-08-26: qty 60

## 2015-08-26 MED ORDER — FUROSEMIDE 40 MG PO TABS
20.0000 mg | ORAL_TABLET | ORAL | Status: DC
  Administered 2015-08-26 – 2015-08-27 (×2): 20 mg via ORAL
  Filled 2015-08-26: qty 1

## 2015-08-26 MED ORDER — BISACODYL 5 MG PO TBEC
5.0000 mg | DELAYED_RELEASE_TABLET | Freq: Every day | ORAL | Status: DC | PRN
  Filled 2015-08-26: qty 1

## 2015-08-26 NOTE — ED Notes (Signed)
Patient having [place] at this time.

## 2015-08-26 NOTE — ED Notes (Signed)
BEHAVIORAL HEALTH ROUNDING Patient sleeping: Yes.   Patient alert and oriented: not applicable SLEEPING Behavior appropriate: Yes.  ; If no, describe: SLEEPING Nutrition and fluids offered: No SLEEPING Toileting and hygiene offered: NoSLEEPING Sitter present: not applicable, Q 15 min safety rounds and observation. Law enforcement present: Yes ODS 

## 2015-08-26 NOTE — ED Notes (Signed)
Called to room by TTS, patient sitting on foot of stretcher.  Patient cooperative and assisted back into stretcher.  Encouraged patient to call out to staff if needing to get out of bed.  Bed alarm placed on patient.

## 2015-08-26 NOTE — ED Notes (Signed)
Spoke with husband Information systems manager) on the phone. Informed him we are still waiting on geriatric psychiatric placement

## 2015-08-26 NOTE — ED Provider Notes (Addendum)
EKG: Interpreted by me, normal sinus rhythm with rate of 94 bpm, normal PR interval, normal QRS, normal QT intervals. Normal axis.  Earleen Newport, MD 08/26/15 1751  Patient has been accepted in transfer to [facility]. Currently she is medically stable for transfer.  Earleen Newport, MD 08/26/15 [phone]

## 2015-08-26 NOTE — ED Notes (Signed)
Patient resting quietly at this time.  No obvious distress noted.  Husband at bedside.

## 2015-08-26 NOTE — ED Notes (Addendum)
Husband showed up in the quad again - he still had visitor's sticker from yesterday so i removed it from his hat and again explained that he couldn't visit at this time. He kept pointing to the phone hours and saying he could visit for 2 hrs. Walked husband back out to the lobby - he keeps saying we need to arrest him, and i explained if we did that he would not be able to visit his wife tomorrow. Once in lobby he told that officer ( a different one from this am) that they needed to arrest him so he could be placed with his wife in room 22. When told he wasn't going to be arrested he walked out to car and drove away. This nurse called intake advising perhaps they could see if any other family is available to help the husband while his wife is ivc'd.

## 2015-08-26 NOTE — BHH Counselor (Addendum)
TTS made two unsuccessful attempts to assess pt. Pt very disorganized in speech and thought process during each attempt. Pt stated "I had a doctors appointment at church this morning". Pt also reported she was "82 yo".

## 2015-08-26 NOTE — ED Notes (Signed)
Patient pulled out IV access
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
## 2015-08-26 NOTE — ED Notes (Signed)
Patient washed off and changed into new scrubs

## 2015-08-26 NOTE — ED Notes (Signed)
Pt up to BR with two person assist.

## 2015-08-26 NOTE — Progress Notes (Signed)
Patient has been accepted to [facility].  Patient assigned to room [place] is Dr. Ananias Pilgrim.  Call report to [phone].  Representative was [place].  ER Staff is aware of it (ER Sect.; Dr. Jimmye Norman, [place] Patient's Nurse)    Patient's Family/Support System (Pt Husband) have been updated as well.  08/26/2015 Con Memos, MS, [place], LPCA Therapeutic Triage Specialist

## 2015-08-26 NOTE — ED Notes (Signed)
Per Pam at [facility] no transport available until morning 3/13

## 2015-08-26 NOTE — Progress Notes (Addendum)
Referral information for Geriatric Placement have been faxed to;      [facility]([phone]),    [facility]([phone]),    [facility]   [facility]([phone]),    [facility]([phone]),    [facility]([phone]).   08/26/2015 Con Memos, MS, [place], [facility]CA Therapeutic Triage Specialist

## 2015-08-26 NOTE — Progress Notes (Addendum)
TTS made unsuccessful attempt to assess pt. Pt continues to disorganized and disoriented. Pt can not attend without fluctuations in state of consciousness . Pt often unresponsive/non-verbal when engaged.     08/26/2015  Con Memos, MS, [place], LPCA Therapeutic Triage Specialist

## 2015-08-26 NOTE — Progress Notes (Signed)
TTS has reached out to the pts husband Sternberg,Mike. TTS offered to answer any questions surrounding his wife's' care. Pt discussed visiting hours, phone times and the referral and placement process with the pts spouse. TTS was informed by the pts husband that she requires 3 liters of oxygen when at home. He states that he is concerned that she has not been placed on oxygen while here in the hospital. Pts husband has also expressed that he has requested that the pt receive a nicotine patch. These requests and concerns have been relayed to the pts nurse.   08/26/2015 Con Memos, MS, [place], [facility]CA Therapeutic Triage Specialist
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
## 2015-08-26 NOTE — ED Notes (Signed)
BEHAVIORAL HEALTH ROUNDING  Patient sleeping: No.  Patient alert and oriented: yes  Behavior appropriate: Yes. ; If no, describe:  Nutrition and fluids offered: Yes  Toileting and hygiene offered: Yes  Sitter present: not applicable, Q 15 min safety rounds and observation.  Law enforcement present: Yes ODS  

## 2015-08-26 NOTE — ED Notes (Signed)

## 2015-08-26 NOTE — ED Notes (Signed)
Accepted to [facility] for transport at [place] to call back if transport available

## 2015-08-26 NOTE — ED Notes (Signed)
Yellow fall braclet placed on left arm and yellow non-skid socks placed for patient safety.
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
## 2015-08-27 LAB — GLUCOSE, CAPILLARY: Glucose-Capillary: 118 mg/dL — ABNORMAL HIGH (ref 65–99)

## 2015-08-27 MED ORDER — FUROSEMIDE 40 MG PO TABS
ORAL_TABLET | ORAL | Status: DC
Start: 2015-08-27 — End: 2015-08-27
  Filled 2015-08-27: qty 1

## 2015-08-27 NOTE — ED Notes (Signed)
BEHAVIORAL HEALTH ROUNDING Patient sleeping: No. Patient alert and oriented: no Behavior appropriate: Yes.  ; If no, describe:  Nutrition and fluids offered: Yes  Toileting and hygiene offered: Yes  Sitter present: not applicable Law enforcement present: Yes  

## 2015-08-27 NOTE — ED Notes (Signed)
Husband updated transfer to [facility] this am.

## 2015-08-27 NOTE — ED Notes (Signed)
.  [facility] 

## 2015-08-27 NOTE — ED Notes (Signed)
Pt ate 100% of food and drank 75% of milk and juice

## 2015-08-27 NOTE — ED Notes (Signed)
Pt ambulated to bathroom with tech assistance. Pt given breakfast tray

## 2015-08-27 NOTE — ED Notes (Signed)
Continue to await sheriff for transfer to [facility]. Pt ambulates to BR as needed with steady gait.

## 2015-08-27 NOTE — ED Notes (Signed)
BEHAVIORAL HEALTH ROUNDING Patient sleeping: Yes.   Patient alert and oriented: not applicable SLEEPING Behavior appropriate: Yes.  ; If no, describe: SLEEPING Nutrition and fluids offered: No SLEEPING Toileting and hygiene offered: NoSLEEPING Sitter present: not applicable, Q 15 min safety rounds and observation. Law enforcement present: Yes ODS 

## 2015-08-27 NOTE — ED Provider Notes (Signed)
-----------------------------------------   5:38 AM on 08/27/2015 -----------------------------------------   BP 158/88 mmHg  Pulse 100  Temp(Src) 98.6 F (37 C) (Oral)  Resp 18  Ht 5\' 5"  (1.651 m)  Wt 164 lb (74.39 kg)  BMI 27.29 kg/m2  SpO2 92%  No acute events since last update.  Disposition is pending per Psychiatry/Behavioral Medicine team recommendations.     Nance Pear, MD 08/27/15 [phone]

## 2015-08-27 NOTE — ED Notes (Signed)
Sheriff here to transport patient. Pt continues to be disoriented but calm, ambulatory with sheriff x 2.

## 2015-08-27 NOTE — ED Notes (Signed)

## 2015-08-27 NOTE — ED Notes (Signed)
Patient resting in bed. Prepared for transfer. Pt calm and cooperative. Report has been given to [facility] at [place].

## 2015-08-30 ENCOUNTER — Other Ambulatory Visit: Payer: Self-pay | Admitting: Family Medicine
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
## 2015-08-31 ENCOUNTER — Ambulatory Visit: Payer: Medicare Other | Admitting: Family Medicine

## 2015-09-15 DIAGNOSIS — M6259 Muscle wasting and atrophy, not elsewhere classified, multiple sites: Secondary | ICD-10-CM | POA: Diagnosis not present

## 2015-09-15 DIAGNOSIS — Z853 Personal history of malignant neoplasm of breast: Secondary | ICD-10-CM | POA: Diagnosis not present

## 2015-09-15 DIAGNOSIS — E559 Vitamin D deficiency, unspecified: Secondary | ICD-10-CM | POA: Diagnosis not present

## 2015-09-15 DIAGNOSIS — E785 Hyperlipidemia, unspecified: Secondary | ICD-10-CM | POA: Diagnosis not present

## 2015-09-15 DIAGNOSIS — J45998 Other asthma: Secondary | ICD-10-CM | POA: Diagnosis not present

## 2015-09-15 DIAGNOSIS — E876 Hypokalemia: Secondary | ICD-10-CM | POA: Diagnosis not present

## 2015-09-15 DIAGNOSIS — I2699 Other pulmonary embolism without acute cor pulmonale: Secondary | ICD-10-CM | POA: Diagnosis not present

## 2015-09-15 DIAGNOSIS — Z741 Need for assistance with personal care: Secondary | ICD-10-CM | POA: Diagnosis not present

## 2015-09-15 DIAGNOSIS — D72829 Elevated white blood cell count, unspecified: Secondary | ICD-10-CM | POA: Diagnosis not present

## 2015-09-15 DIAGNOSIS — Z9181 History of falling: Secondary | ICD-10-CM | POA: Diagnosis not present

## 2015-09-15 DIAGNOSIS — Z9012 Acquired absence of left breast and nipple: Secondary | ICD-10-CM | POA: Diagnosis not present

## 2015-09-15 DIAGNOSIS — I10 Essential (primary) hypertension: Secondary | ICD-10-CM | POA: Diagnosis not present

## 2015-09-15 DIAGNOSIS — J449 Chronic obstructive pulmonary disease, unspecified: Secondary | ICD-10-CM | POA: Diagnosis not present

## 2015-09-15 DIAGNOSIS — E119 Type 2 diabetes mellitus without complications: Secondary | ICD-10-CM | POA: Diagnosis not present

## 2015-09-15 DIAGNOSIS — Z87891 Personal history of nicotine dependence: Secondary | ICD-10-CM | POA: Diagnosis not present

## 2015-09-15 DIAGNOSIS — J3089 Other allergic rhinitis: Secondary | ICD-10-CM | POA: Diagnosis not present

## 2015-09-15 DIAGNOSIS — Z86711 Personal history of pulmonary embolism: Secondary | ICD-10-CM | POA: Diagnosis not present

## 2015-09-15 DIAGNOSIS — R451 Restlessness and agitation: Secondary | ICD-10-CM | POA: Diagnosis not present

## 2015-09-21 DIAGNOSIS — D72829 Elevated white blood cell count, unspecified: Secondary | ICD-10-CM | POA: Diagnosis not present

## 2015-09-21 DIAGNOSIS — E119 Type 2 diabetes mellitus without complications: Secondary | ICD-10-CM | POA: Diagnosis not present

## 2015-09-21 DIAGNOSIS — Z86711 Personal history of pulmonary embolism: Secondary | ICD-10-CM | POA: Diagnosis not present

## 2015-09-28 DIAGNOSIS — Z86711 Personal history of pulmonary embolism: Secondary | ICD-10-CM | POA: Diagnosis not present

## 2015-09-28 DIAGNOSIS — E119 Type 2 diabetes mellitus without complications: Secondary | ICD-10-CM | POA: Diagnosis not present

## 2015-09-28 DIAGNOSIS — D72829 Elevated white blood cell count, unspecified: Secondary | ICD-10-CM | POA: Diagnosis not present

## 2015-09-29 ENCOUNTER — Other Ambulatory Visit
Admission: RE | Admit: 2015-09-29 | Discharge: 2015-09-29 | Disposition: A | Discharge status: Home / Self Care | Payer: Medicare Other | Source: Ambulatory Visit | Attending: Specialist | Admitting: Specialist

## 2015-09-29 DIAGNOSIS — D72829 Elevated white blood cell count, unspecified: Secondary | ICD-10-CM | POA: Insufficient documentation

## 2015-09-29 LAB — BASIC METABOLIC PANEL
ANION GAP: 10 (ref 5–15)
BUN: 13 mg/dL (ref 6–20)
CHLORIDE: 91 mmol/L — AB (ref 101–111)
CO2: 25 mmol/L (ref 22–32)
Calcium: 9.3 mg/dL (ref 8.9–10.3)
Creatinine, Ser: 0.56 mg/dL (ref 0.44–1.00)
GFR calc non Af Amer: >60 mL/min (ref >60)
GLUCOSE: 272 mg/dL — AB (ref 65–99)
POTASSIUM: 3.9 mmol/L (ref 3.5–5.1)
Sodium: 126 mmol/L — ABNORMAL LOW (ref 135–145)

## 2015-10-03 DIAGNOSIS — J449 Chronic obstructive pulmonary disease, unspecified: Secondary | ICD-10-CM | POA: Diagnosis not present

## 2015-10-05 ENCOUNTER — Encounter: Payer: Self-pay | Admitting: Family Medicine

## 2015-10-05 DIAGNOSIS — J449 Chronic obstructive pulmonary disease, unspecified: Secondary | ICD-10-CM | POA: Diagnosis not present

## 2015-10-05 DIAGNOSIS — J9611 Chronic respiratory failure with hypoxia: Secondary | ICD-10-CM | POA: Diagnosis not present

## 2015-10-05 DIAGNOSIS — Z9981 Dependence on supplemental oxygen: Secondary | ICD-10-CM | POA: Diagnosis not present

## 2015-10-05 DIAGNOSIS — E118 Type 2 diabetes mellitus with unspecified complications: Secondary | ICD-10-CM | POA: Diagnosis not present

## 2015-10-05 DIAGNOSIS — K5900 Constipation, unspecified: Secondary | ICD-10-CM | POA: Diagnosis not present

## 2015-10-08 DIAGNOSIS — R0609 Other forms of dyspnea: Secondary | ICD-10-CM | POA: Diagnosis not present

## 2015-10-08 DIAGNOSIS — J9690 Respiratory failure, unspecified, unspecified whether with hypoxia or hypercapnia: Secondary | ICD-10-CM | POA: Diagnosis not present

## 2015-10-09 ENCOUNTER — Encounter: Payer: Self-pay | Admitting: Family Medicine

## 2015-10-10 ENCOUNTER — Ambulatory Visit: Payer: Self-pay | Admitting: Family Medicine

## 2015-10-10 ENCOUNTER — Telehealth: Payer: Self-pay

## 2015-10-10 NOTE — Telephone Encounter (Signed)
Open in erro 

## 2015-11-02 DIAGNOSIS — M6281 Muscle weakness (generalized): Secondary | ICD-10-CM | POA: Diagnosis not present

## 2015-11-02 DIAGNOSIS — E118 Type 2 diabetes mellitus with unspecified complications: Secondary | ICD-10-CM | POA: Diagnosis not present

## 2015-11-02 DIAGNOSIS — K5900 Constipation, unspecified: Secondary | ICD-10-CM | POA: Diagnosis not present

## 2015-11-02 DIAGNOSIS — J449 Chronic obstructive pulmonary disease, unspecified: Secondary | ICD-10-CM | POA: Diagnosis not present

## 2015-11-02 DIAGNOSIS — J9611 Chronic respiratory failure with hypoxia: Secondary | ICD-10-CM | POA: Diagnosis not present

## 2015-11-04 ENCOUNTER — Encounter: Payer: Self-pay | Admitting: Emergency Medicine

## 2015-11-04 ENCOUNTER — Emergency Department: Payer: Medicare Other

## 2015-11-04 ENCOUNTER — Inpatient Hospital Stay
Admission: EM | Admit: 2015-11-04 | Discharge: 2015-11-06 | DRG: 190 | Disposition: A | Discharge status: Home / Self Care | Payer: Medicare Other | Attending: Internal Medicine | Admitting: Internal Medicine

## 2015-11-04 DIAGNOSIS — R58 Hemorrhage, not elsewhere classified: Secondary | ICD-10-CM | POA: Diagnosis not present

## 2015-11-04 DIAGNOSIS — Z791 Long term (current) use of non-steroidal anti-inflammatories (NSAID): Secondary | ICD-10-CM | POA: Diagnosis not present

## 2015-11-04 DIAGNOSIS — F05 Delirium due to known physiological condition: Secondary | ICD-10-CM | POA: Diagnosis present

## 2015-11-04 DIAGNOSIS — R042 Hemoptysis: Secondary | ICD-10-CM | POA: Diagnosis present

## 2015-11-04 DIAGNOSIS — J9811 Atelectasis: Secondary | ICD-10-CM | POA: Diagnosis not present

## 2015-11-04 DIAGNOSIS — Z808 Family history of malignant neoplasm of other organs or systems: Secondary | ICD-10-CM | POA: Diagnosis not present

## 2015-11-04 DIAGNOSIS — J9610 Chronic respiratory failure, unspecified whether with hypoxia or hypercapnia: Secondary | ICD-10-CM | POA: Diagnosis present

## 2015-11-04 DIAGNOSIS — Z8249 Family history of ischemic heart disease and other diseases of the circulatory system: Secondary | ICD-10-CM

## 2015-11-04 DIAGNOSIS — Z7984 Long term (current) use of oral hypoglycemic drugs: Secondary | ICD-10-CM | POA: Diagnosis not present

## 2015-11-04 DIAGNOSIS — Y95 Nosocomial condition: Secondary | ICD-10-CM | POA: Diagnosis present

## 2015-11-04 DIAGNOSIS — Z7901 Long term (current) use of anticoagulants: Secondary | ICD-10-CM

## 2015-11-04 DIAGNOSIS — J189 Pneumonia, unspecified organism: Secondary | ICD-10-CM | POA: Diagnosis not present

## 2015-11-04 DIAGNOSIS — Z86711 Personal history of pulmonary embolism: Secondary | ICD-10-CM

## 2015-11-04 DIAGNOSIS — Z823 Family history of stroke: Secondary | ICD-10-CM | POA: Diagnosis not present

## 2015-11-04 DIAGNOSIS — F0390 Unspecified dementia without behavioral disturbance: Secondary | ICD-10-CM | POA: Diagnosis present

## 2015-11-04 DIAGNOSIS — R262 Difficulty in walking, not elsewhere classified: Secondary | ICD-10-CM | POA: Diagnosis present

## 2015-11-04 DIAGNOSIS — R079 Chest pain, unspecified: Secondary | ICD-10-CM | POA: Diagnosis not present

## 2015-11-04 DIAGNOSIS — Z853 Personal history of malignant neoplasm of breast: Secondary | ICD-10-CM

## 2015-11-04 DIAGNOSIS — Z79899 Other long term (current) drug therapy: Secondary | ICD-10-CM

## 2015-11-04 DIAGNOSIS — Z833 Family history of diabetes mellitus: Secondary | ICD-10-CM

## 2015-11-04 DIAGNOSIS — E118 Type 2 diabetes mellitus with unspecified complications: Secondary | ICD-10-CM | POA: Diagnosis present

## 2015-11-04 DIAGNOSIS — Z888 Allergy status to other drugs, medicaments and biological substances status: Secondary | ICD-10-CM | POA: Diagnosis not present

## 2015-11-04 DIAGNOSIS — E860 Dehydration: Secondary | ICD-10-CM | POA: Diagnosis present

## 2015-11-04 DIAGNOSIS — F17210 Nicotine dependence, cigarettes, uncomplicated: Secondary | ICD-10-CM | POA: Diagnosis present

## 2015-11-04 DIAGNOSIS — R7989 Other specified abnormal findings of blood chemistry: Secondary | ICD-10-CM | POA: Diagnosis not present

## 2015-11-04 DIAGNOSIS — I10 Essential (primary) hypertension: Secondary | ICD-10-CM | POA: Diagnosis not present

## 2015-11-04 DIAGNOSIS — J440 Chronic obstructive pulmonary disease with acute lower respiratory infection: Secondary | ICD-10-CM | POA: Diagnosis not present

## 2015-11-04 DIAGNOSIS — E876 Hypokalemia: Secondary | ICD-10-CM | POA: Diagnosis present

## 2015-11-04 DIAGNOSIS — Z8261 Family history of arthritis: Secondary | ICD-10-CM

## 2015-11-04 DIAGNOSIS — R778 Other specified abnormalities of plasma proteins: Secondary | ICD-10-CM

## 2015-11-04 DIAGNOSIS — E871 Hypo-osmolality and hyponatremia: Secondary | ICD-10-CM | POA: Diagnosis present

## 2015-11-04 DIAGNOSIS — I248 Other forms of acute ischemic heart disease: Secondary | ICD-10-CM | POA: Diagnosis present

## 2015-11-04 DIAGNOSIS — R05 Cough: Secondary | ICD-10-CM | POA: Diagnosis not present

## 2015-11-04 HISTORY — DX: Other pulmonary embolism without acute cor pulmonale: I26.99

## 2015-11-04 LAB — CBC
HEMATOCRIT: 27.1 % — AB (ref 35.0–47.0)
HEMOGLOBIN: 8.6 g/dL — AB (ref 12.0–16.0)
MCH: 24.1 pg — ABNORMAL LOW (ref 26.0–34.0)
MCHC: 31.7 g/dL — ABNORMAL LOW (ref 32.0–36.0)
MCV: 75.9 fL — ABNORMAL LOW (ref 80.0–100.0)
Platelets: 429 10*3/uL (ref 150–440)
RBC: 3.58 MIL/uL — AB (ref 3.80–5.20)
RDW: 17.7 % — ABNORMAL HIGH (ref 11.5–14.5)
WBC: 8.8 10*3/uL (ref 3.6–11.0)

## 2015-11-04 LAB — TROPONIN I
TROPONIN I: 0.12 ng/mL — AB (ref <0.031)
Troponin I: 0.13 ng/mL — ABNORMAL HIGH (ref <0.031)

## 2015-11-04 LAB — CBC WITH DIFFERENTIAL/PLATELET
BASOS ABS: 0.1 10*3/uL (ref 0–0.1)
Basophils Relative: 1 %
Eosinophils Absolute: 0.1 10*3/uL (ref 0–0.7)
Eosinophils Relative: 1 %
HEMATOCRIT: 26.4 % — AB (ref 35.0–47.0)
Hemoglobin: 8.5 g/dL — ABNORMAL LOW (ref 12.0–16.0)
LYMPHS ABS: 1.5 10*3/uL (ref 1.0–3.6)
MCH: 24.4 pg — AB (ref 26.0–34.0)
MCHC: 32 g/dL (ref 32.0–36.0)
MCV: 76.2 fL — AB (ref 80.0–100.0)
MONO ABS: 0.9 10*3/uL (ref 0.2–0.9)
NEUTROS ABS: 5.4 10*3/uL (ref 1.4–6.5)
Neutrophils Relative %: 68 %
Platelets: 411 10*3/uL (ref 150–440)
RBC: 3.46 MIL/uL — ABNORMAL LOW (ref 3.80–5.20)
RDW: 18.2 % — AB (ref 11.5–14.5)
WBC: 7.9 10*3/uL (ref 3.6–11.0)

## 2015-11-04 LAB — MAGNESIUM: Magnesium: 1.6 mg/dL — ABNORMAL LOW (ref 1.7–2.4)

## 2015-11-04 LAB — MRSA PCR SCREENING: MRSA by PCR: NEGATIVE

## 2015-11-04 LAB — BASIC METABOLIC PANEL
ANION GAP: 10 (ref 5–15)
BUN: 7 mg/dL (ref 6–20)
CALCIUM: 9.2 mg/dL (ref 8.9–10.3)
CO2: 25 mmol/L (ref 22–32)
Chloride: 95 mmol/L — ABNORMAL LOW (ref 101–111)
Creatinine, Ser: 0.51 mg/dL (ref 0.44–1.00)
Glucose, Bld: 59 mg/dL — ABNORMAL LOW (ref 65–99)
POTASSIUM: 2.5 mmol/L — AB (ref 3.5–5.1)
SODIUM: 130 mmol/L — AB (ref 135–145)

## 2015-11-04 LAB — TYPE AND SCREEN
ABO/RH(D): A POS
ANTIBODY SCREEN: NEGATIVE

## 2015-11-04 LAB — GLUCOSE, CAPILLARY
Glucose-Capillary: 62 mg/dL — ABNORMAL LOW (ref 65–99)
Glucose-Capillary: 129 mg/dL — ABNORMAL HIGH (ref 65–99)

## 2015-11-04 MED ORDER — PREDNISONE 20 MG PO TABS
50.0000 mg | ORAL_TABLET | Freq: Every day | ORAL | Status: DC
  Administered 2015-11-05 – 2015-11-06 (×2): 50 mg via ORAL
  Filled 2015-11-04 (×5): qty 1

## 2015-11-04 MED ORDER — SODIUM CHLORIDE 0.9% FLUSH: 3.0000 mL | INTRAVENOUS | Status: DC | PRN

## 2015-11-04 MED ORDER — POLYETHYLENE GLYCOL 3350 17 G PO PACK: 17.0000 g | PACK | Freq: Every day | ORAL | Status: DC | PRN

## 2015-11-04 MED ORDER — SODIUM CHLORIDE 0.9% FLUSH
3.0000 mL | Freq: Two times a day (BID) | INTRAVENOUS | Status: DC
  Administered 2015-11-05 (×2): 3 mL via INTRAVENOUS

## 2015-11-04 MED ORDER — INSULIN ASPART 100 UNIT/ML ~~LOC~~ SOLN
0.0000 [IU] | Freq: Three times a day (TID) | SUBCUTANEOUS | Status: DC
  Administered 2015-11-05: 3 [IU] via SUBCUTANEOUS
  Administered 2015-11-05: 2 [IU] via SUBCUTANEOUS
  Administered 2015-11-05: 5 [IU] via SUBCUTANEOUS
  Administered 2015-11-06: 2 [IU] via SUBCUTANEOUS
  Filled 2015-11-04 (×2): qty 2
  Filled 2015-11-04: qty 1
  Filled 2015-11-04: qty 5

## 2015-11-04 MED ORDER — AZITHROMYCIN 250 MG PO TABS
250.0000 mg | ORAL_TABLET | Freq: Every day | ORAL | Status: DC
  Administered 2015-11-05 – 2015-11-06 (×2): 250 mg via ORAL
  Filled 2015-11-04 (×2): qty 1

## 2015-11-04 MED ORDER — BISACODYL 5 MG PO TBEC: 5.0000 mg | DELAYED_RELEASE_TABLET | Freq: Every day | ORAL | Status: DC | PRN

## 2015-11-04 MED ORDER — POTASSIUM CHLORIDE 10 MEQ/100ML IV SOLN
10.0000 meq | Freq: Once | INTRAVENOUS | Status: AC
  Administered 2015-11-04: 10 meq via INTRAVENOUS
  Filled 2015-11-04 (×2): qty 100

## 2015-11-04 MED ORDER — PIPERACILLIN-TAZOBACTAM 3.375 G IVPB 30 MIN: 3.3750 g | Freq: Once | INTRAVENOUS | Status: DC

## 2015-11-04 MED ORDER — IPRATROPIUM-ALBUTEROL 0.5-2.5 (3) MG/3ML IN SOLN
3.0000 mL | RESPIRATORY_TRACT | Status: DC | PRN
Start: 2015-11-04 — End: 2015-11-06

## 2015-11-04 MED ORDER — AZITHROMYCIN 500 MG PO TABS
500.0000 mg | ORAL_TABLET | Freq: Every day | ORAL | Status: AC
  Filled 2015-11-04: qty 1

## 2015-11-04 MED ORDER — IOPAMIDOL (ISOVUE-370) INJECTION 76%
75.0000 mL | Freq: Once | INTRAVENOUS | Status: AC | PRN
  Administered 2015-11-04: 75 mL via INTRAVENOUS

## 2015-11-04 MED ORDER — VANCOMYCIN HCL IN DEXTROSE 1-5 GM/200ML-% IV SOLN
1000.0000 mg | Freq: Once | INTRAVENOUS | Status: AC
  Administered 2015-11-04: 1000 mg via INTRAVENOUS
  Filled 2015-11-04: qty 200

## 2015-11-04 MED ORDER — HYDRALAZINE HCL 20 MG/ML IJ SOLN
INTRAMUSCULAR | Status: AC
  Filled 2015-11-04: qty 1

## 2015-11-04 MED ORDER — HYDROCODONE-ACETAMINOPHEN 5-325 MG PO TABS: 1.0000 | ORAL_TABLET | ORAL | Status: DC | PRN

## 2015-11-04 MED ORDER — DOCUSATE SODIUM 100 MG PO CAPS
100.0000 mg | ORAL_CAPSULE | Freq: Two times a day (BID) | ORAL | Status: DC
  Administered 2015-11-04 – 2015-11-06 (×4): 100 mg via ORAL
  Filled 2015-11-04 (×4): qty 1

## 2015-11-04 MED ORDER — SODIUM CHLORIDE 0.9 % IV SOLN: 250.0000 mL | INTRAVENOUS | Status: DC | PRN

## 2015-11-04 MED ORDER — HYDRALAZINE HCL 20 MG/ML IJ SOLN
10.0000 mg | Freq: Four times a day (QID) | INTRAMUSCULAR | Status: DC | PRN
  Administered 2015-11-05: 10 mg via INTRAVENOUS
  Filled 2015-11-04 (×2): qty 1

## 2015-11-04 MED ORDER — POTASSIUM CHLORIDE CRYS ER 20 MEQ PO TBCR
40.0000 meq | EXTENDED_RELEASE_TABLET | ORAL | Status: DC
  Administered 2015-11-04: 40 meq via ORAL
  Filled 2015-11-04: qty 2

## 2015-11-04 MED ORDER — ACETAMINOPHEN 325 MG PO TABS
650.0000 mg | ORAL_TABLET | Freq: Four times a day (QID) | ORAL | Status: DC | PRN
Start: 2015-11-04 — End: 2015-11-06
  Administered 2015-11-06: 650 mg via ORAL
  Filled 2015-11-04: qty 2

## 2015-11-04 MED ORDER — HYDRALAZINE HCL 20 MG/ML IJ SOLN: 10.0000 mg | Freq: Four times a day (QID) | INTRAMUSCULAR | Status: DC | PRN

## 2015-11-04 MED ORDER — ACETAMINOPHEN 650 MG RE SUPP: 650.0000 mg | Freq: Four times a day (QID) | RECTAL | Status: DC | PRN

## 2015-11-04 MED ORDER — VANCOMYCIN HCL IN DEXTROSE 1-5 GM/200ML-% IV SOLN
1000.0000 mg | INTRAVENOUS | Status: DC
  Administered 2015-11-05: 1000 mg via INTRAVENOUS
  Filled 2015-11-04 (×2): qty 200

## 2015-11-04 MED ORDER — ONDANSETRON HCL 4 MG/2ML IJ SOLN: 4.0000 mg | Freq: Four times a day (QID) | INTRAMUSCULAR | Status: DC | PRN

## 2015-11-04 MED ORDER — ONDANSETRON HCL 4 MG PO TABS: 4.0000 mg | ORAL_TABLET | Freq: Four times a day (QID) | ORAL | Status: DC | PRN

## 2015-11-04 MED ORDER — PIPERACILLIN-TAZOBACTAM 3.375 G IVPB
3.3750 g | Freq: Three times a day (TID) | INTRAVENOUS | Status: DC
  Administered 2015-11-04 – 2015-11-06 (×5): 3.375 g via INTRAVENOUS
  Filled 2015-11-04 (×8): qty 50

## 2015-11-04 NOTE — ED Notes (Signed)
Pt presents to ED from [facility] with cough x3-4 days. 96-98% 2L [place]. Pt with COPD and hx of blood clots .

## 2015-11-04 NOTE — ED Notes (Signed)
Pt states she has been coughing up blood tinged mucus for the past week.  She also c/o chest pain over the right breast. Pt has hx of CA.

## 2015-11-04 NOTE — ED Provider Notes (Addendum)
[facility] Emergency Department Provider Note   ____________________________________________  Time seen: Approximately 1:27 PM  I have reviewed the triage vital signs and the nursing notes.   HISTORY  Chief Complaint Chest Pain and Hemoptysis    HPI Noheli WALAA RIX is a 82 y.o. female who reports she has very bad short-term memory so her husband supplements the history. Patient's been coughing up bloody streaks in her sputum for the last approximately week. She called her primary care doctor and they wanted to see a sample. She's been hurting in the right side of her chest this is chronic and unchanged for several months. She has a history of pulmonary emboli about 3 months ago and thinks she is taking xarelto. Husband is concerned about the thing up blood and patient's husband both report patient has become very weak in the last day or 2 and is having trouble walking and she is weak.   Past Medical History  Diagnosis Date  . Allergy   . Cancer ([place])   . Diabetes mellitus without complication ([place])   . Arthritis   . Asthma   . Cataract   . COPD (chronic obstructive pulmonary disease) ([place])   . Depression     sometimes  . Hyperlipidemia   . Hypertension   . Oxygen deficiency   . Major depressive disorder, recurrent, in full remission with anxious distress ([place]) 11/23/2014  . Vertebral fracture, osteoporotic ([place]) 08/07/2015  . Anxiety   . Pulmonary embolism ([place])     Diagnosis 07/2015 in [place]. On Xarelto    Patient Active Problem List   Diagnosis Date Noted  . Acute delirium 08/22/2015  . Sepsis ([place]) 08/21/2015  . Anxiety 08/21/2015  . UTI (lower urinary tract infection) 08/21/2015  . Dementia 08/08/2015  . Dyspnea on exertion 08/07/2015  . Vertebral fracture, osteoporotic ([place]) 08/07/2015  . Fracture of rib of left side with routine healing 08/07/2015  . AP (abdominal pain)   . Abdominal pain 07/21/2015  . Acute hyponatremia  07/21/2015  . Hypokalemia 07/21/2015  . Left hip pain 07/09/2015  . Benign essential tremor 07/09/2015  . Venous stasis dermatitis of both lower extremities 07/03/2015  . Bilateral edema of lower extremity 05/30/2015  . Bleeding nose 05/08/2015  . Morning headache 05/01/2015  . Intermittent confusion 04/17/2015  . Cough 04/17/2015  . Constipation, slow transit 04/09/2015  . Allergic rhinitis with postnasal drip 04/09/2015  . Hypertension goal BP (blood pressure) < 140/90 01/22/2015  . Major depressive disorder, recurrent, in full remission with anxious distress ([place]) 11/23/2014  . Heavy cigarette smoker 11/23/2014  . COPD, severe ([place]) 11/23/2014  . DD (diverticular disease) 11/23/2014  . Personal history of malignant neoplasm of breast 11/23/2014  . History of digestive disease 11/23/2014  . Personal history of healed traumatic fracture 11/23/2014  . HLD (hyperlipidemia) 11/23/2014  . Peripheral neuropathic pain ([place]) 11/23/2014  . At risk for falling 11/23/2014  . Diabetes mellitus type 2, controlled, with complications ([place]) [id number]    Past Surgical History  Procedure Laterality Date  . Breast surgery Left     Current Outpatient Rx  Name  Route  Sig  Dispense  Refill  . albuterol (PROVENTIL HFA;VENTOLIN HFA) 108 (90 Base) MCG/ACT inhaler   Inhalation   Inhale 1-2 puffs into the lungs every 4 (four) hours as needed for wheezing or shortness of breath.   1 Inhaler   5     PATIENT ALLERGIC TO PROAIR   . cloNIDine (CATAPRES) 0.1  MG tablet   Oral   Take 0.1 mg by mouth 2 (two) times daily.         Marland Kitchen docusate sodium (COLACE) 100 MG capsule   Oral   Take 1 capsule (100 mg total) by mouth 2 (two) times daily.   10 capsule   0   . donepezil (ARICEPT) 5 MG tablet   Oral   Take 1 tablet (5 mg total) by mouth at bedtime.   30 tablet   1   . furosemide (LASIX) 20 MG tablet   Oral   Take 1 tablet (20 mg total) by mouth every morning.   90 tablet   1   .  gemfibrozil (LOPID) 600 MG tablet   Oral   Take 600 mg by mouth 2 (two) times daily before a meal.         . glipiZIDE (GLUCOTROL) 10 MG tablet   Oral   Take 10 mg by mouth daily.         Marland Kitchen loratadine (CLARITIN) 10 MG tablet   Oral   Take 10 mg by mouth daily.          . magnesium oxide (MAG-OX) 400 (241.3 Mg) MG tablet   Oral   Take 1 tablet (400 mg total) by mouth 2 (two) times daily.   10 tablet   0   . meloxicam (MOBIC) 15 MG tablet   Oral   Take 1 tablet (15 mg total) by mouth daily.   90 tablet   2   . metFORMIN (GLUCOPHAGE) 1000 MG tablet   Oral   Take 1 tablet (1,000 mg total) by mouth 2 (two) times daily with a meal.   180 tablet   1     Disregard previous doses and RXs for this medicati ...   . mometasone (ELOCON) 0.1 % lotion   Topical   Apply 1 application topically as needed (for dry,itchy skin). Reported on 08/22/2015         . montelukast (SINGULAIR) 10 MG tablet   Oral   Take 10 mg by mouth at bedtime.         Marland Kitchen nystatin (MYCOSTATIN) 100000 UNIT/ML suspension   Oral   Take 5 mLs (500,000 Units total) by mouth 4 (four) times daily. Swish retain in mouth long as possible then swallow   120 mL   0   . potassium chloride (K-DUR) 10 MEQ tablet   Oral   Take 10 mEq by mouth 2 (two) times daily.         . pravastatin (PRAVACHOL) 20 MG tablet   Oral   Take 20 mg by mouth daily.         . QUEtiapine (SEROQUEL) 50 MG tablet   Oral   Take 50 mg by mouth daily.         . sertraline (ZOLOFT) 100 MG tablet   Oral   Take 100 mg by mouth daily.         Marland Kitchen tiotropium (SPIRIVA) 18 MCG inhalation capsule   Inhalation   Place 18 mcg into inhaler and inhale at bedtime.         . triamcinolone lotion (KENALOG) 0.1 %   Topical   Apply 1 application topically 2 (two) times daily.          Alveda Reasons 20 MG TABS tablet   Oral   Take 20 mg by mouth daily.           Dispense as  written.     Allergies Ciprofloxacin; Inhaler  decongestant; Molds & smuts; and Albuterol sulfate  Family History  Problem Relation Age of Onset  . Arthritis Mother   . Arthritis Father   . Cancer Sister     brain  . Heart disease Sister   . Stroke Sister   . Diabetes Brother     Social History Social History  Substance Use Topics  . Smoking status: Current Every Day Smoker -- 1.00 packs/day for 45 years    Types: Cigarettes  . Smokeless tobacco: Current User  . Alcohol Use: No    Review of Systems Constitutional: No fever/chills Eyes: No visual changes. ENT: No sore throat. Cardiovascular: See history of present illness. Respiratory: Denies any change in chronic shortness of breath. Gastrointestinal: No abdominal pain.  No nausea, no vomiting.  No diarrhea.  No constipation. Genitourinary: Negative for dysuria. Musculoskeletal: Negative for back pain. Skin: Negative for rash. Neurological: Negative for headaches, focal weakness or numbness.  10-point ROS otherwise negative.  ____________________________________________   PHYSICAL EXAM:  VITAL SIGNS: ED Triage Vitals  Enc Vitals Group     BP 11/04/15 1158 154/83 mmHg     Pulse Rate 11/04/15 1158 89     Resp 11/04/15 1158 14     Temp 11/04/15 1158 98.5 F (36.9 C)     Temp Source 11/04/15 1158 Oral     SpO2 11/04/15 1158 98 %     Weight 11/04/15 1158 152 lb 4.8 oz (69.083 kg)     Height 11/04/15 1158 5\' 4"  (1.626 m)     Head Cir --      Peak Flow --      Pain Score 11/04/15 1205 0     Pain Loc --      Pain Edu? --      Excl. in [place]? --     Constitutional: Alert and oriented. Well appearing and in no acute distress. Eyes: Conjunctivae are normal. PERRL. EOMI. Head: Atraumatic. Nose: No congestion/rhinnorhea. Mouth/Throat: Mucous membranes are moist.  Oropharynx non-erythematous. Neck: No stridor.   Cardiovascular: Normal rate, regular rhythm. Grossly normal heart sounds.  Good peripheral circulation. Respiratory: Normal respiratory effort.  No  retractions. Lungs CTAB. Gastrointestinal: Soft and nontender. No distention. No abdominal bruits. No CVA tenderness. Musculoskeletal: No lower extremity tenderness nor edema.  No joint effusions. Neurologic:  Normal speech and language. No gross focal neurologic deficits are appreciated. No gait instability. Skin:  Skin is warm, dry and intact. No rash noted. Psychiatric: Mood and affect are normal. Speech and behavior are normal.  Please note cough is productive of yellowish cream colored thick sputum no blood in the sputum I saw.  ____________________________________________   LABS (all labs ordered are listed, but only abnormal results are displayed)  Labs Reviewed  BASIC METABOLIC PANEL - Abnormal; Notable for the following:    Sodium 130 (*)    Potassium 2.5 (*)    Chloride 95 (*)    Glucose, Bld 59 (*)    All other components within normal limits  CBC - Abnormal; Notable for the following:    RBC 3.58 (*)    Hemoglobin 8.6 (*)    HCT 27.1 (*)    MCV 75.9 (*)    MCH 24.1 (*)    MCHC 31.7 (*)    RDW 17.7 (*)    All other components within normal limits  TROPONIN I - Abnormal; Notable for the following:    Troponin I 0.12 (*)  All other components within normal limits  URINE CULTURE  I-STAT CG4 LACTIC ACID, ED  I-STAT CG4 LACTIC ACID, ED  TYPE AND SCREEN   ____________________________________________  EKG  EKG read and interpreted by me shows sinus rhythm at a rate of 92 borderline left axis occasional PACs no acute ST-T wave changes poor R-wave progression ____________________________________________  RADIOLOGY  CT of the chest read by radiology is most consistent with multifocal infection rapidly progressing. ____________________________________________   PROCEDURES   Critical Care performed: Medical care time 15 minutes. This includes discussing the patient's findings lab, EKG and CT scan with the patient and the patient's husband had to do this several  times for each thing because I talked to the patient and the patient's husband separately and then again together  ____________________________________________   INITIAL IMPRESSION / Twilight / ED COURSE  Pertinent labs & imaging results that were available during my care of the patient were reviewed by me and considered in my medical decision making (see chart for details).   ____________________________________________   FINAL CLINICAL IMPRESSION(S) / ED DIAGNOSES  Final diagnoses:  Community acquired pneumonia  Elevated troponin  Hemoptysis      NEW MEDICATIONS STARTED DURING THIS VISIT:  New Prescriptions   No medications on file     Note:  This document was prepared using Dragon voice recognition software and may include unintentional dictation errors.  ----------------------------------------- 2:55 PM on 11/04/2015 -----------------------------------------    Nena Polio, MD 11/04/15 [phone]

## 2015-11-04 NOTE — Progress Notes (Addendum)
ANTIBIOTIC CONSULT NOTE - INITIAL  Pharmacy Consult for Zosyn, Vancomycin  Indication: pneumonia  Allergies  Allergen Reactions  . Ciprofloxacin Other (See Comments)    Reaction:  Unknown   . Inhaler Decongestant [Methamphetamine] Other (See Comments)    Reaction:  Unknown   . Molds & Smuts Other (See Comments)    Reaction:  Wheezing/itchy,watery eyes   . Albuterol Sulfate Palpitations and Other (See Comments)    Pt states that she is allergic to the [facility] brand only.      Patient Measurements: Height: 5\' 4"  (162.6 cm) Weight: 152 lb 4.8 oz (69.083 kg) IBW/kg (Calculated) : 54.7 Adjusted Body Weight: 60.5 kg   Vital Signs: Temp: 98.5 F (36.9 C) (05/21 1158) Temp Source: Oral (05/21 1158) BP: 154/83 mmHg (05/21 1158) Pulse Rate: 89 (05/21 1158) Intake/Output from previous day:   Intake/Output from this shift:    Labs:  Recent Labs  11/04/15 1218  WBC 8.8  HGB 8.6*  PLT 429  CREATININE 0.51   Estimated Creatinine Clearance: 52.7 mL/min (by C-G formula based on Cr of 0.51). No results for input(s): VANCOTROUGH, VANCOPEAK, VANCORANDOM, GENTTROUGH, GENTPEAK, GENTRANDOM, TOBRATROUGH, TOBRAPEAK, TOBRARND, AMIKACINPEAK, AMIKACINTROU, AMIKACIN in the last 72 hours.   Microbiology: No results found for this or any previous visit (from the past 720 hour(s)).  Medical History: Past Medical History  Diagnosis Date  . Allergy   . Cancer ([place])   . Diabetes mellitus without complication ([place])   . Arthritis   . Asthma   . Cataract   . COPD (chronic obstructive pulmonary disease) ([place])   . Depression     sometimes  . Hyperlipidemia   . Hypertension   . Oxygen deficiency   . Major depressive disorder, recurrent, in full remission with anxious distress ([place]) 11/23/2014  . Vertebral fracture, osteoporotic ([place]) 08/07/2015  . Anxiety   . Pulmonary embolism ([place])     Diagnosis 07/2015 in [place]. On Xarelto    Medications:   (Not in a hospital  admission) Assessment: CrCl = 52.7 ml/min  Ke = 0.048 hr-1 T1/2 = 14.44 hrs Vd = 48.4 L   Goal of Therapy:  Vancomycin trough level 15-20 mcg/ml  Plan:  Expected duration 7 days with resolution of temperature and/or normalization of WBC   Vancomycin 1 gm IV X 1 ordered for 5/21 @ 18:00. Vancomycin 1 gm IV Q18H ordered to start 5/22 @ 02:00, ~ 8 hrs after 1st dose (stacked dosing).  This pt will reach Css by 5/24 @ 18:00.  Will draw 1st trough on 5/24 @ 01:30, which will be approaching Css.   Zosyn 3.375 gm IV Q8H ordered to start 5/21.   Sadira Standard D 11/04/2015,3:20 PM

## 2015-11-04 NOTE — H&P (Signed)
[facility] [place] NAME: Jocelyn Burgess    MR#:  [id number]  DATE OF BIRTH:  Dec 29, 1931  DATE OF ADMISSION:  11/04/2015  PRIMARY CARE PHYSICIAN: Bobetta Lime, MD   REQUESTING/REFERRING PHYSICIAN: Dr. Cinda Quest  CHIEF COMPLAINT:   Chief Complaint  Patient presents with  . Chest Pain  . Hemoptysis    HISTORY OF PRESENT ILLNESS:  Jocelyn Burgess  is a 82 y.o. female with a known history of COPD, chronic respiratory failure, diabetes, hypertension, breast cancer presents to the emergency room complaining of one week of dizziness and worsening weakness. Patient also has chronic right-sided chest pain which is worse. Pruritus. Has noticed streaks of blood in her sputum for 3 days now. Some worsening of shortness of breath. Vision is on 2 L oxygen chronically. Ambulates with a walker. Patient was diagnosed with pulmonary embolism 3 months back in [employer] and started on Xarelto. Today a CT scan of the chest done in the emergency room showed no PE but showed bilateral pneumonia in the bibasilar and right upper lobe area. Significant lymphadenopathy.  PAST MEDICAL HISTORY:   Past Medical History  Diagnosis Date  . Allergy   . Cancer ([place])   . Diabetes mellitus without complication ([place])   . Arthritis   . Asthma   . Cataract   . COPD (chronic obstructive pulmonary disease) ([place])   . Depression     sometimes  . Hyperlipidemia   . Hypertension   . Oxygen deficiency   . Major depressive disorder, recurrent, in full remission with anxious distress ([place]) 11/23/2014  . Vertebral fracture, osteoporotic ([place]) 08/07/2015  . Anxiety   . Pulmonary embolism ([place])     Diagnosis 07/2015 in [place]. On Xarelto    PAST SURGICAL HISTORY:   Past Surgical History  Procedure Laterality Date  . Breast surgery Left     SOCIAL HISTORY:   Social History  Substance Use Topics  . Smoking status: Current Every Day Smoker -- 1.00 packs/day  for 45 years    Types: Cigarettes  . Smokeless tobacco: Current User  . Alcohol Use: No    FAMILY HISTORY:   Family History  Problem Relation Age of Onset  . Arthritis Mother   . Arthritis Father   . Cancer Sister     brain  . Heart disease Sister   . Stroke Sister   . Diabetes Brother     DRUG ALLERGIES:   Allergies  Allergen Reactions  . Ciprofloxacin Other (See Comments)    Reaction:  Unknown   . Inhaler Decongestant [Methamphetamine] Other (See Comments)    Reaction:  Unknown   . Molds & Smuts Other (See Comments)    Reaction:  Wheezing/itchy,watery eyes   . Albuterol Sulfate Palpitations and Other (See Comments)    Pt states that she is allergic to the [facility] brand only.      REVIEW OF SYSTEMS:   Review of Systems  Constitutional: Positive for malaise/fatigue. Negative for fever, chills and weight loss.  HENT: Negative for hearing loss and nosebleeds.   Eyes: Negative for blurred vision, double vision and pain.  Respiratory: Positive for cough, hemoptysis, sputum production, shortness of breath and wheezing.   Cardiovascular: Positive for chest pain. Negative for palpitations, orthopnea and leg swelling.  Gastrointestinal: Negative for nausea, vomiting, abdominal pain, diarrhea and constipation.  Genitourinary: Negative for dysuria and hematuria.  Musculoskeletal: Negative for myalgias, back pain and falls.  Skin: Negative for rash.  Neurological:  Positive for weakness. Negative for dizziness, tremors, sensory change, speech change, focal weakness, seizures and headaches.  Endo/Heme/Allergies: Does not bruise/bleed easily.  Psychiatric/Behavioral: Negative for depression and memory loss. The patient is not nervous/anxious.     MEDICATIONS AT HOME:   Prior to Admission medications   Medication Sig Start Date End Date Taking? Authorizing Provider  albuterol (PROVENTIL HFA;VENTOLIN HFA) 108 (90 Base) MCG/ACT inhaler Inhale 1-2 puffs into the lungs every 4  (four) hours as needed for wheezing or shortness of breath. 08/15/15  Yes Bobetta Lime, MD  cloNIDine (CATAPRES) 0.1 MG tablet Take 0.1 mg by mouth 2 (two) times daily.   Yes Historical Provider, MD  docusate sodium (COLACE) 100 MG capsule Take 1 capsule (100 mg total) by mouth 2 (two) times daily. 07/26/15  Yes Demetrios Loll, MD  donepezil (ARICEPT) 5 MG tablet Take 1 tablet (5 mg total) by mouth at bedtime. 08/08/15  Yes Bobetta Lime, MD  furosemide (LASIX) 20 MG tablet Take 1 tablet (20 mg total) by mouth every morning. 07/30/15  Yes Bobetta Lime, MD  gemfibrozil (LOPID) 600 MG tablet Take 600 mg by mouth 2 (two) times daily before a meal.   Yes Historical Provider, MD  glipiZIDE (GLUCOTROL) 10 MG tablet Take 10 mg by mouth daily. 10/02/15  Yes Historical Provider, MD  loratadine (CLARITIN) 10 MG tablet Take 10 mg by mouth daily.    Yes Historical Provider, MD  magnesium oxide (MAG-OX) 400 (241.3 Mg) MG tablet Take 1 tablet (400 mg total) by mouth 2 (two) times daily. 08/23/15  Yes Fritzi Mandes, MD  meloxicam (MOBIC) 15 MG tablet Take 1 tablet (15 mg total) by mouth daily. 05/01/15  Yes Bobetta Lime, MD  metFORMIN (GLUCOPHAGE) 1000 MG tablet Take 1 tablet (1,000 mg total) by mouth 2 (two) times daily with a meal. 07/03/15  Yes Bobetta Lime, MD  mometasone (ELOCON) 0.1 % lotion Apply 1 application topically as needed (for dry,itchy skin). Reported on 08/22/2015   Yes Historical Provider, MD  montelukast (SINGULAIR) 10 MG tablet Take 10 mg by mouth at bedtime.   Yes Historical Provider, MD  nystatin (MYCOSTATIN) 100000 UNIT/ML suspension Take 5 mLs (500,000 Units total) by mouth 4 (four) times daily. Swish retain in mouth long as possible then swallow 05/23/15  Yes Bobetta Lime, MD  potassium chloride (K-DUR) 10 MEQ tablet Take 10 mEq by mouth 2 (two) times daily.   Yes Historical Provider, MD  pravastatin (PRAVACHOL) 20 MG tablet Take 20 mg by mouth daily. 10/02/15  Yes Historical Provider, MD   QUEtiapine (SEROQUEL) 50 MG tablet Take 50 mg by mouth daily. 10/02/15  Yes Historical Provider, MD  sertraline (ZOLOFT) 100 MG tablet Take 100 mg by mouth daily. 10/02/15  Yes Historical Provider, MD  tiotropium (SPIRIVA) 18 MCG inhalation capsule Place 18 mcg into inhaler and inhale at bedtime.   Yes Historical Provider, MD  triamcinolone lotion (KENALOG) 0.1 % Apply 1 application topically 2 (two) times daily.  10/02/15  Yes Historical Provider, MD  XARELTO 20 MG TABS tablet Take 20 mg by mouth daily. 10/02/15  Yes Historical Provider, MD     VITAL SIGNS:  Blood pressure 154/83, pulse 89, temperature 98.5 F (36.9 C), temperature source Oral, resp. rate 14, height 5\' 4"  (1.626 m), weight 69.083 kg (152 lb 4.8 oz), SpO2 98 %.  PHYSICAL EXAMINATION:  Physical Exam  GENERAL:  82 y.o.-year-old patient lying in the bed with no acute distress.  EYES: Pupils equal, round, reactive to light  and accommodation. No scleral icterus. Extraocular muscles intact.  HEENT: Head atraumatic, normocephalic. Oropharynx and nasopharynx clear. No oropharyngeal erythema, moist oral mucosa  NECK:  Supple, no jugular venous distention. No thyroid enlargement, no tenderness.  LUNGS: Poor air entry bilaterally. Mild wheezing. CARDIOVASCULAR: S1, S2 normal. No murmurs, rubs, or gallops.  ABDOMEN: Soft, nontender, nondistended. Bowel sounds present. No organomegaly or mass.  EXTREMITIES: No pedal edema, cyanosis, or clubbing. + 2 pedal & radial pulses b/l.   NEUROLOGIC: Cranial nerves II through XII are intact. No focal Motor or sensory deficits appreciated b/l PSYCHIATRIC: The patient is alert and oriented x 2. Pleasant SKIN: No obvious rash, lesion, or ulcer.   LABORATORY PANEL:   CBC  Recent Labs Lab 11/04/15 1218  WBC 8.8  HGB 8.6*  HCT 27.1*  PLT 429   ------------------------------------------------------------------------------------------------------------------  Chemistries   Recent Labs Lab  11/04/15 1218  NA 130*  K 2.5*  CL 95*  CO2 25  GLUCOSE 59*  BUN 7  CREATININE 0.51  CALCIUM 9.2   ------------------------------------------------------------------------------------------------------------------  Cardiac Enzymes  Recent Labs Lab 11/04/15 1218  TROPONINI 0.12*   ------------------------------------------------------------------------------------------------------------------  RADIOLOGY:  Ct Angio Chest Pe W/cm &/or Wo Cm  11/04/2015  CLINICAL DATA:  Cough x 3-4 days, blood in sputum, history of blood clots EXAM: CT ANGIOGRAPHY CHEST WITH CONTRAST TECHNIQUE: Multidetector CT imaging of the chest was performed using the standard protocol during bolus administration of intravenous contrast. Multiplanar CT image reconstructions and MIPs were obtained to evaluate the vascular anatomy. CONTRAST:  75 mL Isovue 370 IV COMPARISON:  Chest radiograph dated 11/04/2015. CT chest dated 07/23/2015. FINDINGS: No evidence of pulmonary embolism. Mediastinum/Nodes: The heart is normal in size. No pericardial effusion. Three vessel coronary atherosclerosis. Atherosclerotic calcifications of the aortic arch. Thoracic lymphadenopathy, progressed, including: --11 mm short axis right paratracheal node (series 4/ image 33) --12 mm short axis low right paratracheal node (series 4/ image 46) --11 mm short axis AP window node (series 4/ image 51) --14 mm short axis right hilar node (series 4/image 63) --22 mm short axis right infrahilar/lower lobe node (series 4/image 70) Visualized thyroid is notable for a dominant 16 mm left thyroid nodule, grossly unchanged. Lungs/Pleura: 2.6 x 2.4 cm spiculated nodular opacity in the lateral right upper lobe (series 6/ image 57), new. Adjacent satellite nodularity (series 6/image 67). 6.0 x 4.6 cm spiculated mass-like opacity in the left lower lobe (series 6/ image 83). Adjacent nodular opacities in the left lower lobe (series 6/ images 97 and 102). Additional  nodular/mass-like opacities in the posterior lung bases (series 6/ image 103), with additional nodular opacities in the right lower lobe. These findings are all essentially new from the prior CT. Despite the spiculated/mass-like appearance of several of these lesions, the rapid progression from the prior suggests multifocal infection. Underlying moderate centrilobular emphysematous changes. Small left and trace right pleural effusions.  No pneumothorax. Upper abdomen: Visualized upper abdomen is grossly unremarkable. Musculoskeletal: Severe compression fracture deformity at T6, with vertebral planum. Mild to moderate inferior endplate compression fracture deformity at T5. Moderate compression fracture deformity at T12, new. Review of the MIP images confirms the above findings. IMPRESSION: No evidence pulmonary embolism. Multiple patchy/ spiculated opacities in the right upper lobe and bilateral lower lobes, as described above, new from prior CT. Given rapid progression, the appearance favors multifocal infection. Follow-up CT chest is suggested in 6 weeks, after appropriate antimicrobial therapy. Small right and trace left pleural effusions. Progression of thoracic lymphadenopathy, possibly reactive. Progression  of thoracic compression fracture deformities, as above. Electronically Signed   By: Julian Hy M.D.   On: 11/04/2015 14:26   Dg Chest Portable 1 View  11/04/2015  CLINICAL DATA:  82 year old with 1 week history of hemoptysis and right-sided chest pain. Personal history of left breast cancer. EXAM: PORTABLE CHEST 1 VIEW COMPARISON:  08/26/2015 and earlier, including CT chest 07/23/2015. FINDINGS: Cardiac silhouette mildly enlarged for AP portable technique, unchanged. Masslike opacity in the medial left lung base, new since the most recent examination 08/26/2015. Peripheral patchy airspace opacity in the right upper lobe, also new. Emphysematous changes throughout both lungs. Linear atelectasis in  the lung bases, right greater than left, related to less than optimal inspiration. Pulmonary vascularity normal. IMPRESSION: 1. New masslike opacity in the medial left lung base since March, 2017. 2. New patchy peripheral airspace opacities in the inferior right upper lobe. 3. Mild bibasilar atelectasis. 4. Stable mild cardiomegaly without pulmonary edema. Electronically Signed   By: Evangeline Dakin M.D.   On: 11/04/2015 13:38     IMPRESSION AND PLAN:   * Bilateral pneumonia Likely healthcare acquired pneumonia due to recent hospital stay and nursing home resident. We'll start Zosyn, vancomycin and azithromycin. Send sputum cultures. Patient has significant lymphadenopathy. Will need a repeat CT scan in 3 months to check for resolution and any lung tumors.  * Hemoptysis Due to pneumonia and patient being on Xarelto. Has been on Xarelto for 3 months due to pulmonary embolism that was diagnosed in February 2017.. CT of the chest showed no PE. We'll hold Xarelto till hemoptysis clears.  * Mild elevation of troponin. This is likely demand ischemia. We will repeat troponin. No history of CAD Check echocardiogram.  * Mild hyponatremia likely from dehydration. We'll start IV fluids. Repeat labs in the morning.  * Hypokalemia Due to decreased oral intake. Replace orally.  * Diabetes mellitus Continue home medications and sliding scale insulin.  * Hypertension Continue medications  * Dementia Watch for inpatient delirium  * DVT prophylaxis With SCDs   All the records are reviewed and case discussed with ED provider. Management plans discussed with the patient, family and they are in agreement.  CODE STATUS: FULL CODE  TOTAL TIME TAKING CARE OF THIS PATIENT: 40 minutes.   Hillary Bow R M.D on 11/04/2015 at 3:00 PM  Between 7am to 6pm - Pager - [phone]  After 6pm go to [email or web address] - password [email or web address] [place] [facility]  Office   [phone]  CC: Primary care physician; Bobetta Lime, MD  Note: This dictation was prepared with Dragon dictation along with smaller phrase technology. Any transcriptional errors that result from this process are unintentional.

## 2015-11-04 NOTE — Progress Notes (Signed)
Hydralazine not indicated at this time

## 2015-11-05 ENCOUNTER — Inpatient Hospital Stay
Admit: 2015-11-05 | Discharge: 2015-11-05 | Disposition: A | Discharge status: Home / Self Care | Payer: Medicare Other | Attending: Internal Medicine | Admitting: Internal Medicine

## 2015-11-05 LAB — CBC
HCT: 26.7 % — ABNORMAL LOW (ref 35.0–47.0)
HEMOGLOBIN: 8.7 g/dL — AB (ref 12.0–16.0)
MCH: 24.5 pg — AB (ref 26.0–34.0)
MCHC: 32.5 g/dL (ref 32.0–36.0)
MCV: 75.4 fL — ABNORMAL LOW (ref 80.0–100.0)
Platelets: 424 10*3/uL (ref 150–440)
RBC: 3.54 MIL/uL — AB (ref 3.80–5.20)
RDW: 17.8 % — ABNORMAL HIGH (ref 11.5–14.5)
WBC: 9.6 10*3/uL (ref 3.6–11.0)

## 2015-11-05 LAB — BASIC METABOLIC PANEL
ANION GAP: 8 (ref 5–15)
BUN: 7 mg/dL (ref 6–20)
CALCIUM: 9.1 mg/dL (ref 8.9–10.3)
CHLORIDE: 99 mmol/L — AB (ref 101–111)
CO2: 27 mmol/L (ref 22–32)
Creatinine, Ser: 0.38 mg/dL — ABNORMAL LOW (ref 0.44–1.00)
GFR calc non Af Amer: >60 mL/min (ref >60)
Glucose, Bld: 130 mg/dL — ABNORMAL HIGH (ref 65–99)
Potassium: 3 mmol/L — ABNORMAL LOW (ref 3.5–5.1)
Sodium: 134 mmol/L — ABNORMAL LOW (ref 135–145)

## 2015-11-05 LAB — ECHOCARDIOGRAM COMPLETE
HEIGHTINCHES: 64 in
Weight: 2400 oz

## 2015-11-05 LAB — GLUCOSE, CAPILLARY
GLUCOSE-CAPILLARY: 205 mg/dL — AB (ref 65–99)
GLUCOSE-CAPILLARY: 128 mg/dL — AB (ref 65–99)
Glucose-Capillary: 158 mg/dL — ABNORMAL HIGH (ref 65–99)
Glucose-Capillary: 271 mg/dL — ABNORMAL HIGH (ref 65–99)

## 2015-11-05 LAB — TROPONIN I: Troponin I: 0.1 ng/mL — ABNORMAL HIGH (ref <0.031)

## 2015-11-05 MED ORDER — CLONIDINE HCL 0.1 MG PO TABS
0.1000 mg | ORAL_TABLET | Freq: Two times a day (BID) | ORAL | Status: DC
  Administered 2015-11-05 – 2015-11-06 (×3): 0.1 mg via ORAL
  Filled 2015-11-05 (×3): qty 1

## 2015-11-05 MED ORDER — PRAVASTATIN SODIUM 20 MG PO TABS
20.0000 mg | ORAL_TABLET | Freq: Every day | ORAL | Status: DC
  Administered 2015-11-05: 20 mg via ORAL
  Filled 2015-11-05: qty 1

## 2015-11-05 MED ORDER — POTASSIUM CHLORIDE CRYS ER 10 MEQ PO TBCR
10.0000 meq | EXTENDED_RELEASE_TABLET | Freq: Two times a day (BID) | ORAL | Status: DC
  Administered 2015-11-06: 10 meq via ORAL
  Filled 2015-11-05: qty 1

## 2015-11-05 MED ORDER — QUETIAPINE FUMARATE 25 MG PO TABS
50.0000 mg | ORAL_TABLET | Freq: Every day | ORAL | Status: DC
  Administered 2015-11-05 – 2015-11-06 (×2): 50 mg via ORAL
  Filled 2015-11-05 (×2): qty 2

## 2015-11-05 MED ORDER — MAGNESIUM SULFATE 2 GM/50ML IV SOLN
2.0000 g | Freq: Once | INTRAVENOUS | Status: AC
  Administered 2015-11-05: 2 g via INTRAVENOUS
  Filled 2015-11-05: qty 50

## 2015-11-05 MED ORDER — GEMFIBROZIL 600 MG PO TABS
600.0000 mg | ORAL_TABLET | Freq: Two times a day (BID) | ORAL | Status: DC
  Administered 2015-11-05 – 2015-11-06 (×3): 600 mg via ORAL
  Filled 2015-11-05 (×4): qty 1

## 2015-11-05 MED ORDER — GLIPIZIDE 5 MG PO TABS
2.5000 mg | ORAL_TABLET | Freq: Every day | ORAL | Status: DC
  Administered 2015-11-05: 2.5 mg via ORAL
  Filled 2015-11-05 (×3): qty 1

## 2015-11-05 MED ORDER — MONTELUKAST SODIUM 10 MG PO TABS
10.0000 mg | ORAL_TABLET | Freq: Every day | ORAL | Status: DC
  Administered 2015-11-05: 10 mg via ORAL
  Filled 2015-11-05: qty 1

## 2015-11-05 MED ORDER — FUROSEMIDE 20 MG PO TABS
20.0000 mg | ORAL_TABLET | ORAL | Status: DC
  Administered 2015-11-05 – 2015-11-06 (×2): 20 mg via ORAL
  Filled 2015-11-05 (×2): qty 1

## 2015-11-05 MED ORDER — HALOPERIDOL LACTATE 5 MG/ML IJ SOLN
5.0000 mg | Freq: Once | INTRAMUSCULAR | Status: AC
  Administered 2015-11-05: 5 mg via INTRAVENOUS
  Filled 2015-11-05: qty 1

## 2015-11-05 MED ORDER — LORATADINE 10 MG PO TABS
10.0000 mg | ORAL_TABLET | Freq: Every day | ORAL | Status: DC
  Administered 2015-11-05 – 2015-11-06 (×2): 10 mg via ORAL
  Filled 2015-11-05 (×2): qty 1

## 2015-11-05 MED ORDER — METFORMIN HCL 500 MG PO TABS
1000.0000 mg | ORAL_TABLET | Freq: Two times a day (BID) | ORAL | Status: DC
  Administered 2015-11-05 – 2015-11-06 (×3): 1000 mg via ORAL
  Filled 2015-11-05 (×3): qty 2

## 2015-11-05 MED ORDER — POTASSIUM CHLORIDE CRYS ER 20 MEQ PO TBCR
40.0000 meq | EXTENDED_RELEASE_TABLET | ORAL | Status: AC
Start: 2015-11-05 — End: 2015-11-05
  Administered 2015-11-05 (×2): 40 meq via ORAL
  Filled 2015-11-05 (×2): qty 2

## 2015-11-05 MED ORDER — NYSTATIN 100000 UNIT/ML MT SUSP
5.0000 mL | Freq: Four times a day (QID) | OROMUCOSAL | Status: DC
  Administered 2015-11-05 (×4): 500000 [IU] via ORAL
  Filled 2015-11-05 (×4): qty 5

## 2015-11-05 MED ORDER — HALOPERIDOL LACTATE 5 MG/ML IJ SOLN: 5.0000 mg | Freq: Two times a day (BID) | INTRAMUSCULAR | Status: DC | PRN

## 2015-11-05 MED ORDER — MAGNESIUM OXIDE 400 (241.3 MG) MG PO TABS
400.0000 mg | ORAL_TABLET | Freq: Two times a day (BID) | ORAL | Status: DC
  Administered 2015-11-05 – 2015-11-06 (×3): 400 mg via ORAL
  Filled 2015-11-05 (×3): qty 1

## 2015-11-05 MED ORDER — TIOTROPIUM BROMIDE MONOHYDRATE 18 MCG IN CAPS
18.0000 ug | ORAL_CAPSULE | Freq: Every day | RESPIRATORY_TRACT | Status: DC
  Administered 2015-11-05: 18 ug via RESPIRATORY_TRACT
  Filled 2015-11-05: qty 5

## 2015-11-05 MED ORDER — MELOXICAM 7.5 MG PO TABS
15.0000 mg | ORAL_TABLET | Freq: Every day | ORAL | Status: DC
  Administered 2015-11-05 – 2015-11-06 (×2): 15 mg via ORAL
  Filled 2015-11-05 (×3): qty 2

## 2015-11-05 MED ORDER — DONEPEZIL HCL 5 MG PO TABS
5.0000 mg | ORAL_TABLET | Freq: Every day | ORAL | Status: DC
  Administered 2015-11-05: 5 mg via ORAL
  Filled 2015-11-05: qty 1

## 2015-11-05 MED ORDER — SERTRALINE HCL 100 MG PO TABS
100.0000 mg | ORAL_TABLET | Freq: Every day | ORAL | Status: DC
  Administered 2015-11-05 – 2015-11-06 (×2): 100 mg via ORAL
  Filled 2015-11-05 (×2): qty 1

## 2015-11-05 NOTE — Clinical Social Work Note (Signed)
Clinical Social Work Assessment  Patient Details  Name: Jocelyn Burgess MRN: [id number] Date of Birth: Oct 24, 1931  Date of referral:  11/05/15               Reason for consult:  Other (Comment Required) (From [name] )                Permission sought to share information with:  Chartered certified accountant granted to share information::  Yes, Verbal Permission Granted  Name::      [name]   Agency::     Relationship::     Contact Information:     Housing/Transportation Living arrangements for the past 2 months:  [place] of Information:  Patient, Spouse, Facility Patient Interpreter Needed:  None Criminal Activity/Legal Involvement Pertinent to Current Situation/Hospitalization:  No - Comment as needed Significant Relationships:  Spouse Lives with:  Facility Resident Do you feel safe going back to the place where you live?  Yes Need for family participation in patient care:  Yes (Comment)  Care giving concerns:  Patient is a resident at [name] ([address])    Facilities manager / plan:  Holiday representative ([place]) received consult that patient is from [facility]. CSW contacted [name]. Per Jocelyn Burgess patient is on chronic oxygen at 2 liters and uses a walker to ambulate at baseline. Per Jocelyn Burgess patient has been a resident at [facility] for a littler over 1 month now and her husband Jocelyn Burgess is her main contact. Per Jocelyn Burgess patient can return to [facility] with [facility]. RN Case Manager aware of above. CSW met with patient alone at bedside. Patient was pleasant and was sitting up in the chair. Patient is agreeable to return to [facility]. Per patient she has memory loss and gave CSW permission to call her husband Jocelyn Burgess. CSW contacted patient's husband who is agreeable for patient to return to [name]. Per Jocelyn Burgess he will transport patient to [facility] and bring her walker and portable oxygen tank when patient is ready for D/C.  FL2 complete. CSW will continue to follow and assist as needed.     Employment status:  Retired Nurse, adult PT Recommendations:  Not assessed at this time Information / Referral to community resources:  Other (Comment Required) (Patient will return to [name]. )  Patient/Family's Response to care:  Patient and her husband Jocelyn Burgess are agreeable for patient to return to [name].   Patient/Family's Understanding of and Emotional Response to Diagnosis, Current Treatment, and Prognosis:  Patient and husband were pleasant and thanked CSW for visit.   Emotional Assessment Appearance:  Appears stated age Attitude/Demeanor/Rapport:    Affect (typically observed):  Accepting, Adaptable, Pleasant Orientation:  Oriented to Self, Fluctuating Orientation (Suspected and/or reported Sundowners) Alcohol / Substance use:  Not Applicable Psych involvement (Current and /or in the community):  No (Comment)  Discharge Needs  Concerns to be addressed:  Discharge Planning Concerns Readmission within the last 30 days:  No Current discharge risk:  Cognitively Impaired, Chronically ill Barriers to Discharge:  Continued Medical Work up   Jocelyn Freshwater, LCSW 11/05/2015, 2:36 PM

## 2015-11-05 NOTE — Progress Notes (Signed)
Inpatient Diabetes Program Recommendations  AACE/ADA: New Consensus Statement on Inpatient Glycemic Control (2015)  Target Ranges:  Prepandial:   less than 140 mg/dL      Peak postprandial:   less than 180 mg/dL (1-2 hours)      Critically ill patients:  140 - 180 mg/dL   Review of Glycemic Control:  Results for CHAMPAIGN, WANGELIN (MRN [id number]) as of 11/05/2015 11:33  Ref. Range 08/26/2015 21:58 08/27/2015 06:34 11/04/2015 17:10 11/04/2015 21:51 11/05/2015 07:57  Glucose-Capillary Latest Ref Range: 65-99 mg/dL 57 (L) 118 (H) 62 (L) 129 (H) 158 (H)    Diabetes history: Type 2 diabetes Outpatient Diabetes medications: Glipizide 10 mg daily, Metformin 1000 mg bid Current orders for Inpatient glycemic control:  Novolog sensitive tid with meals, Glipizide 2.5 mg daily with breakfast, Metformin 1000 mg bid  Inpatient Diabetes Program Recommendations:    MD, please consider holding diabetes oral medications while patient is in the hospital.  Thanks, Adah Perl, RN, BC-ADM Inpatient Diabetes Coordinator Pager [phone]

## 2015-11-05 NOTE — Evaluation (Signed)
Physical Therapy Evaluation Patient Details Name: Jocelyn Burgess MRN: [id number] DOB: 1931-12-14 Today's Date: 11/05/2015   History of Present Illness  Jocelyn Burgess is a 82 y.o. female with a known history of COPD, chronic respiratory failure, diabetes, hypertension, breast cancer presents to the emergency room complaining of one week of dizziness and worsening weakness. Patient also has chronic right-sided chest pain which is worse. Pruritus. Has noticed streaks of blood in her sputum for 3 days now. Some worsening of shortness of breath. Vision is on 2 L oxygen chronically. Ambulates with a walker. Patient was diagnosed with pulmonary embolism 3 months back in [place] and started on Xarelto. Today a CT scan of the chest done in the emergency room showed no PE but showed bilateral pneumonia in the bibasilar and right upper lobe area. Significant lymphadenopathy. Pt is an unreliable historian and information obtained from medical record  Clinical Impression  Pt with dementia and an unreliable historian. Heavily reliant on medical record for background. Pt demonstrates modified independent bed mobility and CGA only for transfers and ambulation. She is somewhat impulsive indicating safety concerns but is fairly steady and stable in standing with bilateral UE support on rolling walker. Pt is able to ambulate limited distances in room but becomes fatigued. She would benefit from [facility] PT assessment at ALF however by the time of discharge may no longer need PT services as her strength appears fairly good. Pt will benefit from skilled PT services to address deficits in strength, balance, and mobility in order to return to full function at home.     Follow Up Recommendations Home health PT;Other (comment) (HH PT at ALF)    Equipment Recommendations  None recommended by PT    Recommendations for Other Services       Precautions / Restrictions Precautions Precautions: Fall Restrictions Weight  Bearing Restrictions: No      Mobility  Bed Mobility Overal bed mobility: Modified Independent             General bed mobility comments: Pt demonstrates good speed/sequencing with use of bed rails  Transfers Overall transfer level: Needs assistance Equipment used: Rolling walker (2 wheeled) Transfers: Sit to/from Stand Sit to Stand: Min guard         General transfer comment: Pt is impulsive and attempts to stand before therapist is ready. Does not realize she is connected to IV. Once ready pt demonstrates steady transfers with safe hand placement and correct use of rolling walker  Ambulation/Gait Ambulation/Gait assistance: Min guard Ambulation Distance (Feet): 20 Feet Assistive device: Rolling walker (2 wheeled) Gait Pattern/deviations: Decreased step length - right;Decreased step length - left Gait velocity: Decreased Gait velocity interpretation: Below normal speed for age/gender General Gait Details: Pt is somewhat impulsive with gait frequently letting go of walker and requiring cues for sequencing with turns. However she is steady and stable with support on rolling walker. Pt with fatigue and HR increases to 120 following ambulation. Quickly recovers to low 100's with seated rest break  Stairs            Wheelchair Mobility    Modified Rankin (Stroke Patients Only)       Balance Overall balance assessment: Needs assistance Sitting-balance support: No upper extremity supported Sitting balance-Leahy Scale: Good     Standing balance support: No upper extremity supported Standing balance-Leahy Scale: Fair Standing balance comment: Pt able to maintain static standing balance in both wide and narrow stance. Further balance screening deferred. Dynamic balance is correct with  use of rolling walker                             Pertinent Vitals/Pain Pain Assessment: No/denies pain    Home Living Family/patient expects to be discharged to::  Assisted living               Home Equipment: Walker - standard;Cane - single point      Prior Function Level of Independence: Needs assistance   Gait / Transfers Assistance Needed: Per medical record pt ambulates at facility with rolling walker  ADL's / Homemaking Assistance Needed: Unable to obtain from patient        Hand Dominance   Dominant Hand: Right    Extremity/Trunk Assessment   Upper Extremity Assessment: Overall WFL for tasks assessed           Lower Extremity Assessment: Overall WFL for tasks assessed         Communication   Communication: No difficulties  Cognition Arousal/Alertness: Awake/alert Behavior During Therapy: Impulsive Overall Cognitive Status: No family/caregiver present to determine baseline cognitive functioning (Pt with history of dementia per medical record)       Memory: Decreased short-term memory              General Comments      Exercises        Assessment/Plan    PT Assessment Patient needs continued PT services  PT Diagnosis Difficulty walking;Abnormality of gait;Generalized weakness   PT Problem List Decreased strength;Decreased activity tolerance;Decreased balance;Decreased mobility;Decreased cognition;Cardiopulmonary status limiting activity  PT Treatment Interventions DME instruction;Gait training;Functional mobility training;Therapeutic activities;Therapeutic exercise;Balance training;Cognitive remediation;Patient/family education   PT Goals (Current goals can be found in the Care Plan section) Acute Rehab PT Goals Patient Stated Goal: Return to prior function at ALF, pt remembers she is from a facilty but cannot provide name (Jocelyn Burgess in chart) PT Goal Formulation: With patient Time For Goal Achievement: 11/19/15 Potential to Achieve Goals: Fair    Frequency Min 2X/week   Barriers to discharge        Co-evaluation               End of Session Equipment Utilized During Treatment: Gait  belt;Oxygen Activity Tolerance: Patient tolerated treatment well Patient left: in bed;with call bell/phone within reach;with bed alarm set;with nursing/sitter in room Nurse Communication: Mobility status         Time: [id number] PT Time Calculation (min) (ACUTE ONLY): 16 min   Charges:   PT Evaluation $PT Eval Low Complexity: 1 Procedure     PT G Codes:       Jocelyn Burgess PT, DPT   Jocelyn Burgess 11/05/2015, 5:24 PM

## 2015-11-05 NOTE — Progress Notes (Signed)
Patient alert x2. Combative and spiting on nursing staff. Patient removed IV, tele leads, and oxygen. Nursing intervention taking with no relief. Dr. Jannifer Franklin notified new orders given. Medication giving and patient placed back in room.

## 2015-11-05 NOTE — Care Management Note (Signed)
Case Management Note  Patient Details  Name: Jocelyn Burgess MRN: [id number] Date of Birth: Oct 25, 1931  Subjective/Objective:                  Met briefly with patient and her husband to discuss discharge planning. Husband said patient was from [facility] assisted living- patient is from [facility] assisted living formally named. She is open to [facility] home [facility] also. She is on chronic O2 through [facility].   Action/Plan: Abby with [facility] has been notified. RNCM will continue to follow along with CSW.   Expected Discharge Date:                  Expected Discharge Plan:     In-House Referral:     Discharge planning Services  CM Consult  Post Acute Care Choice:  Home Health Choice offered to:  Patient, Spouse  DME Arranged:    DME Agency:     HH Arranged:    HH Agency:  [facility]  Status of Service:  In process, will continue to follow  Medicare Important Message Given:    Date Medicare IM Given:    Medicare IM give by:    Date Additional Medicare IM Given:    Additional Medicare Important Message give by:     If discussed at [place] of Stay Meetings, dates discussed:    Additional Comments:  Marshell Garfinkel, RN 11/05/2015, 10:19 AM

## 2015-11-05 NOTE — Clinical Documentation Improvement (Signed)
Internal Medicine   Based on the clinical findings below, please document if dementia associated with    .    Behavioral disturbance  Other  Clinically Undetermined  Supporting Information: 11/05/15 Nursing noted: "Combative and spiting on nursing staff. Patient removed IV, tele leads, and oxygen. Nursing intervention taking with no relief. Dr. Jannifer Franklin notified new orders given. Medication giving and patient placed back in room."   Haldol 5 mg injection given  Please exercise your independent, professional judgment when responding. A specific answer is not anticipated or expected. Please update your documentation within the medical record to reflect your response to this query. Thank you  Thank You, [place] [phone]

## 2015-11-05 NOTE — Plan of Care (Signed)
Problem: Safety: Goal: Ability to remain free from injury will improve Outcome: Progressing Pt has called appropriately for assistance when needing to use the restroom or get out the bed during my care.   Problem: Pain Managment: Goal: General experience of comfort will improve Outcome: Progressing Pt has not complained of any pain during my care.   Problem: Activity: Goal: Risk for activity intolerance will decrease Outcome: Progressing Pt was able to ambulate to the restroom with one person assist and sit up in chair for lunch during my care.

## 2015-11-05 NOTE — NC FL2 (Signed)
[place] LEVEL OF CARE SCREENING TOOL     IDENTIFICATION  Patient Name: Jocelyn Burgess Birthdate: 1932-06-08 Sex: female Admission Date (Current Location): 11/04/2015  [facility] and [place] Number:  Jocelyn Burgess  ([id number] [facility]) Facility and Address:  [facility], [address], [place], [place] [address]      Provider Number: [id number]  Attending Physician Name and Address:  Hillary Bow, MD  Relative Name and Phone Number:       Current Level of Care: Hospital Recommended Level of Care: [place] Prior Approval Number:    Date Approved/Denied:   PASRR Number:  ( [id number] )  Discharge Plan: Domiciliary (Rest home)    Current Diagnoses: Patient Active Problem List   Diagnosis Date Noted  . Pneumonia 11/04/2015  . Acute delirium 08/22/2015  . Sepsis ([place]) 08/21/2015  . Anxiety 08/21/2015  . UTI (lower urinary tract infection) 08/21/2015  . Dementia 08/08/2015  . Dyspnea on exertion 08/07/2015  . Vertebral fracture, osteoporotic ([place]) 08/07/2015  . Fracture of rib of left side with routine healing 08/07/2015  . AP (abdominal pain)   . Abdominal pain 07/21/2015  . Acute hyponatremia 07/21/2015  . Hypokalemia 07/21/2015  . Left hip pain 07/09/2015  . Benign essential tremor 07/09/2015  . Venous stasis dermatitis of both lower extremities 07/03/2015  . Bilateral edema of lower extremity 05/30/2015  . Bleeding nose 05/08/2015  . Morning headache 05/01/2015  . Intermittent confusion 04/17/2015  . Cough 04/17/2015  . Constipation, slow transit 04/09/2015  . Allergic rhinitis with postnasal drip 04/09/2015  . Hypertension goal BP (blood pressure) < 140/90 01/22/2015  . Major depressive disorder, recurrent, in full remission with anxious distress ([place]) 11/23/2014  . Heavy cigarette smoker 11/23/2014  . COPD, severe ([place]) 11/23/2014  . DD (diverticular disease) 11/23/2014  . Personal history of malignant neoplasm  of breast 11/23/2014  . History of digestive disease 11/23/2014  . Personal history of healed traumatic fracture 11/23/2014  . HLD (hyperlipidemia) 11/23/2014  . Peripheral neuropathic pain ([place]) 11/23/2014  . At risk for falling 11/23/2014  . Diabetes mellitus type 2, controlled, with complications ([place]) [id number]    Orientation RESPIRATION BLADDER Height & Weight     Self  O2 (2 Liters Chronic Oxygen ) Incontinent Weight: 150 lb (68.04 kg) Height:  5\' 4"  (162.6 cm)  BEHAVIORAL SYMPTOMS/MOOD NEUROLOGICAL BOWEL NUTRITION STATUS   (none )  (none ) Continent Diet (Diet: Carb Modified )  AMBULATORY STATUS COMMUNICATION OF NEEDS Skin   Supervision Verbally Normal                       Personal Care Assistance Level of Assistance  Bathing, Feeding, Dressing Bathing Assistance: Independent Feeding assistance: Independent Dressing Assistance: Independent     Functional Limitations Info  Sight, Hearing, Speech Sight Info: Adequate Hearing Info: Adequate Speech Info: Adequate    SPECIAL CARE FACTORS FREQUENCY  PT (By licensed PT)     PT Frequency:  (Home Health via [facility] 2-3 days per week. )              Contractures      Additional Factors Info  Code Status, Allergies Code Status Info:  (Full Code. ) Allergies Info:  (Ciprofloxacin, Inhaler Decongestant, Molds & Smuts, Albuterol Sulfate)           Current Medications (11/05/2015):  This is the current hospital active medication list Current Facility-Administered Medications  Medication Dose Route Frequency Provider Last Rate Last  Dose  . 0.9 %  sodium chloride infusion  250 mL Intravenous PRN Srikar Sudini, MD      . acetaminophen (TYLENOL) tablet 650 mg  650 mg Oral Q6H PRN Hillary Bow, MD       Or  . acetaminophen (TYLENOL) suppository 650 mg  650 mg Rectal Q6H PRN Srikar Sudini, MD      . azithromycin (ZITHROMAX) tablet 250 mg  250 mg Oral Daily Hillary Bow, MD   250 mg at 11/05/15 0935  .  bisacodyl (DULCOLAX) EC tablet 5 mg  5 mg Oral Daily PRN Srikar Sudini, MD      . cloNIDine (CATAPRES) tablet 0.1 mg  0.1 mg Oral BID Hillary Bow, MD   0.1 mg at 11/05/15 0933  . docusate sodium (COLACE) capsule 100 mg  100 mg Oral BID Hillary Bow, MD   100 mg at 11/05/15 0933  . donepezil (ARICEPT) tablet 5 mg  5 mg Oral QHS Srikar Sudini, MD      . furosemide (LASIX) tablet 20 mg  20 mg Oral Julianne Rice, MD   20 mg at 11/05/15 0933  . gemfibrozil (LOPID) tablet 600 mg  600 mg Oral BID AC Srikar Sudini, MD   600 mg at 11/05/15 0933  . glipiZIDE (GLUCOTROL) tablet 2.5 mg  2.5 mg Oral QAC breakfast Srikar Sudini, MD   2.5 mg at 11/05/15 0933  . haloperidol lactate (HALDOL) injection 5 mg  5 mg Intravenous Q12H PRN Srikar Sudini, MD      . hydrALAZINE (APRESOLINE) injection 10 mg  10 mg Intravenous Q6H PRN Hillary Bow, MD   10 mg at 11/05/15 0610  . HYDROcodone-acetaminophen (NORCO/VICODIN) 5-325 MG per tablet 1-2 tablet  1-2 tablet Oral Q4H PRN Srikar Sudini, MD      . insulin aspart (novoLOG) injection 0-9 Units  0-9 Units Subcutaneous TID WC Hillary Bow, MD   3 Units at 11/05/15 1304  . ipratropium-albuterol (DUONEB) 0.5-2.5 (3) MG/3ML nebulizer solution 3 mL  3 mL Nebulization Q4H PRN Srikar Sudini, MD      . loratadine (CLARITIN) tablet 10 mg  10 mg Oral Daily Hillary Bow, MD   10 mg at 11/05/15 0933  . magnesium oxide (MAG-OX) tablet 400 mg  400 mg Oral BID Hillary Bow, MD   400 mg at 11/05/15 0933  . meloxicam (MOBIC) tablet 15 mg  15 mg Oral Daily Hillary Bow, MD   15 mg at 11/05/15 0933  . metFORMIN (GLUCOPHAGE) tablet 1,000 mg  1,000 mg Oral BID WC Hillary Bow, MD   1,000 mg at 11/05/15 0933  . montelukast (SINGULAIR) tablet 10 mg  10 mg Oral QHS Srikar Sudini, MD      . nystatin (MYCOSTATIN) 100000 UNIT/ML suspension 500,000 Units  5 mL Oral QID Hillary Bow, MD   500,000 Units at 11/05/15 1305  . ondansetron (ZOFRAN) tablet 4 mg  4 mg Oral Q6H PRN Hillary Bow,  MD       Or  . ondansetron (ZOFRAN) injection 4 mg  4 mg Intravenous Q6H PRN Srikar Sudini, MD      . piperacillin-tazobactam (ZOSYN) IVPB 3.375 g  3.375 g Intravenous Q8H Srikar Sudini, MD   3.375 g at 11/05/15 1307  . polyethylene glycol (MIRALAX / GLYCOLAX) packet 17 g  17 g Oral Daily PRN Hillary Bow, MD      . Derrill Memo ON 11/06/2015] potassium chloride (K-DUR,KLOR-CON) CR tablet 10 mEq  10 mEq Oral BID Hillary Bow, MD      .  pravastatin (PRAVACHOL) tablet 20 mg  20 mg Oral q1800 Srikar Sudini, MD      . predniSONE (DELTASONE) tablet 50 mg  50 mg Oral Q breakfast Hillary Bow, MD   50 mg at 11/05/15 0933  . QUEtiapine (SEROQUEL) tablet 50 mg  50 mg Oral Daily Hillary Bow, MD   50 mg at 11/05/15 0933  . sertraline (ZOLOFT) tablet 100 mg  100 mg Oral Daily Hillary Bow, MD   100 mg at 11/05/15 0933  . sodium chloride flush (NS) 0.9 % injection 3 mL  3 mL Intravenous Q12H Hillary Bow, MD   3 mL at 11/05/15 0933  . sodium chloride flush (NS) 0.9 % injection 3 mL  3 mL Intravenous PRN Srikar Sudini, MD      . tiotropium (SPIRIVA) inhalation capsule 18 mcg  18 mcg Inhalation QHS Srikar Sudini, MD      . vancomycin (VANCOCIN) IVPB 1000 mg/200 mL premix  1,000 mg Intravenous Q18H Hillary Bow, MD   1,000 mg at 11/05/15 0214     Discharge Medications: Please see discharge summary for a list of discharge medications.  Relevant Imaging Results:  Relevant Lab Results:   Additional Information  (SSN: [id number])  Loralyn Freshwater, LCSW

## 2015-11-05 NOTE — Progress Notes (Signed)
[facility] [place] NAME: Jocelyn Burgess    MR#:  [id number]  DATE OF BIRTH:  1932/06/05  SUBJECTIVE:  CHIEF COMPLAINT:   Chief Complaint  Patient presents with  . Chest Pain  . Hemoptysis   Afebrile. Mild shortness of breath. Cough with no sputum. Was confused overnight.  REVIEW OF SYSTEMS:    Review of Systems  Constitutional: Negative for fever and chills.  HENT: Negative for sore throat.   Eyes: Negative for blurred vision, double vision and pain.  Respiratory: Positive for cough and shortness of breath. Negative for hemoptysis and wheezing.   Cardiovascular: Negative for chest pain, palpitations, orthopnea and leg swelling.  Gastrointestinal: Negative for heartburn, nausea, vomiting, abdominal pain, diarrhea and constipation.  Genitourinary: Negative for dysuria and hematuria.  Musculoskeletal: Negative for back pain and joint pain.  Skin: Negative for rash.  Neurological: Negative for sensory change, speech change, focal weakness and headaches.  Endo/Heme/Allergies: Does not bruise/bleed easily.  Psychiatric/Behavioral: Positive for memory loss. Negative for depression. The patient is not nervous/anxious.     DRUG ALLERGIES:   Allergies  Allergen Reactions  . Ciprofloxacin Other (See Comments)    Reaction:  Unknown   . Inhaler Decongestant [Methamphetamine] Other (See Comments)    Reaction:  Unknown   . Molds & Smuts Other (See Comments)    Reaction:  Wheezing/itchy,watery eyes   . Albuterol Sulfate Palpitations and Other (See Comments)    Pt states that she is allergic to the [facility] brand only.      VITALS:  Blood pressure 163/82, pulse 83, temperature 98 F (36.7 C), temperature source Axillary, resp. rate 18, height 5\' 4"  (1.626 m), weight 68.04 kg (150 lb), SpO2 99 %.  PHYSICAL EXAMINATION:   Physical Exam  GENERAL:  82 y.o.-year-old patient lying in the bed with no acute distress.  EYES: Pupils  equal, round, reactive to light and accommodation. No scleral icterus. Extraocular muscles intact.  HEENT: Head atraumatic, normocephalic. Oropharynx and nasopharynx clear.  NECK:  Supple, no jugular venous distention. No thyroid enlargement, no tenderness.  LUNGS: Normal breath sounds bilaterally, no wheezing, rales, rhonchi. No use of accessory muscles of respiration.  CARDIOVASCULAR: S1, S2 normal. No murmurs, rubs, or gallops.  ABDOMEN: Soft, nontender, nondistended. Bowel sounds present. No organomegaly or mass.  EXTREMITIES: No cyanosis, clubbing or edema b/l.    NEUROLOGIC: Cranial nerves II through XII are intact. No focal Motor or sensory deficits b/l.   PSYCHIATRIC: The patient is alert and awake. Confused. SKIN: No obvious rash, lesion, or ulcer.   LABORATORY PANEL:   CBC  Recent Labs Lab 11/05/15 0010  WBC 9.6  HGB 8.7*  HCT 26.7*  PLT 424   ------------------------------------------------------------------------------------------------------------------ Chemistries   Recent Labs Lab 11/04/15 1743 11/05/15 0010  NA  --  134*  K  --  3.0*  CL  --  99*  CO2  --  27  GLUCOSE  --  130*  BUN  --  7  CREATININE  --  0.38*  CALCIUM  --  9.1  MG 1.6*  --    ------------------------------------------------------------------------------------------------------------------  Cardiac Enzymes  Recent Labs Lab 11/05/15 0010  TROPONINI 0.10*   ------------------------------------------------------------------------------------------------------------------  RADIOLOGY:  Ct Angio Chest Pe W/cm &/or Wo Cm  11/04/2015  CLINICAL DATA:  Cough x 3-4 days, blood in sputum, history of blood clots EXAM: CT ANGIOGRAPHY CHEST WITH CONTRAST TECHNIQUE: Multidetector CT imaging of the chest was performed using the standard protocol  during bolus administration of intravenous contrast. Multiplanar CT image reconstructions and MIPs were obtained to evaluate the vascular anatomy.  CONTRAST:  75 mL Isovue 370 IV COMPARISON:  Chest radiograph dated 11/04/2015. CT chest dated 07/23/2015. FINDINGS: No evidence of pulmonary embolism. Mediastinum/Nodes: The heart is normal in size. No pericardial effusion. Three vessel coronary atherosclerosis. Atherosclerotic calcifications of the aortic arch. Thoracic lymphadenopathy, progressed, including: --11 mm short axis right paratracheal node (series 4/ image 33) --12 mm short axis low right paratracheal node (series 4/ image 46) --11 mm short axis AP window node (series 4/ image 51) --14 mm short axis right hilar node (series 4/image 63) --22 mm short axis right infrahilar/lower lobe node (series 4/image 70) Visualized thyroid is notable for a dominant 16 mm left thyroid nodule, grossly unchanged. Lungs/Pleura: 2.6 x 2.4 cm spiculated nodular opacity in the lateral right upper lobe (series 6/ image 57), new. Adjacent satellite nodularity (series 6/image 67). 6.0 x 4.6 cm spiculated mass-like opacity in the left lower lobe (series 6/ image 83). Adjacent nodular opacities in the left lower lobe (series 6/ images 97 and 102). Additional nodular/mass-like opacities in the posterior lung bases (series 6/ image 103), with additional nodular opacities in the right lower lobe. These findings are all essentially new from the prior CT. Despite the spiculated/mass-like appearance of several of these lesions, the rapid progression from the prior suggests multifocal infection. Underlying moderate centrilobular emphysematous changes. Small left and trace right pleural effusions.  No pneumothorax. Upper abdomen: Visualized upper abdomen is grossly unremarkable. Musculoskeletal: Severe compression fracture deformity at T6, with vertebral planum. Mild to moderate inferior endplate compression fracture deformity at T5. Moderate compression fracture deformity at T12, new. Review of the MIP images confirms the above findings. IMPRESSION: No evidence pulmonary embolism.  Multiple patchy/ spiculated opacities in the right upper lobe and bilateral lower lobes, as described above, new from prior CT. Given rapid progression, the appearance favors multifocal infection. Follow-up CT chest is suggested in 6 weeks, after appropriate antimicrobial therapy. Small right and trace left pleural effusions. Progression of thoracic lymphadenopathy, possibly reactive. Progression of thoracic compression fracture deformities, as above. Electronically Signed   By: Julian Hy M.D.   On: 11/04/2015 14:26   Dg Chest Portable 1 View  11/04/2015  CLINICAL DATA:  82 year old with 1 week history of hemoptysis and right-sided chest pain. Personal history of left breast cancer. EXAM: PORTABLE CHEST 1 VIEW COMPARISON:  08/26/2015 and earlier, including CT chest 07/23/2015. FINDINGS: Cardiac silhouette mildly enlarged for AP portable technique, unchanged. Masslike opacity in the medial left lung base, new since the most recent examination 08/26/2015. Peripheral patchy airspace opacity in the right upper lobe, also new. Emphysematous changes throughout both lungs. Linear atelectasis in the lung bases, right greater than left, related to less than optimal inspiration. Pulmonary vascularity normal. IMPRESSION: 1. New masslike opacity in the medial left lung base since March, 2017. 2. New patchy peripheral airspace opacities in the inferior right upper lobe. 3. Mild bibasilar atelectasis. 4. Stable mild cardiomegaly without pulmonary edema. Electronically Signed   By: Evangeline Dakin M.D.   On: 11/04/2015 13:38   ASSESSMENT AND PLAN:   * Bilateral pneumonia- healthcare acquired pneumonia IV  Zosyn, vancomycin and azithromycin. sputum cultures ordered Repeat CT scan in 3 months to check for resolution and any lung tumors.  * Hemoptysis Due to pneumonia and patient being on Xarelto. Has been on Xarelto for 3 months due to pulmonary embolism that was diagnosed in February 2017.Marland Kitchen CT  of the chest  showed no PE. We'll hold Xarelto till hemoptysis clears.  * Mild elevation of troponin. This is likely demand ischemia. No history of CAD Repeat troponin trending down.  * Mild hyponatremia likely from dehydration.  Improved with IV fluids. She does have mild chronic hyponatremia.  * Hypokalemia Due to decreased oral intake. Replace orally.  * Diabetes mellitus Continue home medications and sliding scale insulin.  * Hypertension Continue medications  * Dementia Patient had severe inpatient delirium yesterday night. We'll order as needed Haldol.  * DVT prophylaxis With SCDs  All the records are reviewed and case discussed with Care Management/Social Workerr. Management plans discussed with the patient, family and they are in agreement.  CODE STATUS: FULL CODE  DVT Prophylaxis: SCDs  TOTAL TIME TAKING CARE OF THIS PATIENT: 30 minutes.   POSSIBLE D/C IN 1-2 DAYS, DEPENDING ON CLINICAL CONDITION.  Hillary Bow R M.D on 11/05/2015 at 1:22 PM  Between 7am to 6pm - Pager - [phone]  After 6pm go to [email or web address] - password [email or web address] [facility]  Office  [phone]  CC: Primary care physician; Bobetta Lime, MD  Note: This dictation was prepared with Dragon dictation along with smaller phrase technology. Any transcriptional errors that result from this process are unintentional.

## 2015-11-05 NOTE — Progress Notes (Signed)
*  PRELIMINARY RESULTS* Echocardiogram 2D Echocardiogram has been performed.  Jocelyn Burgess 11/05/2015, 10:01 AM

## 2015-11-06 LAB — GLUCOSE, CAPILLARY
Glucose-Capillary: 192 mg/dL — ABNORMAL HIGH (ref 65–99)
Glucose-Capillary: 208 mg/dL — ABNORMAL HIGH (ref 65–99)

## 2015-11-06 MED ORDER — AMOXICILLIN-POT CLAVULANATE 875-125 MG PO TABS: 1.0000 | ORAL_TABLET | Freq: Two times a day (BID) | ORAL | Status: DC

## 2015-11-06 NOTE — Discharge Summary (Signed)
[place] [facility] [place] NAME: Jocelyn Burgess    MR#:  [id number]  DATE OF BIRTH:  08/23/1931  DATE OF ADMISSION:  11/04/2015 ADMITTING PHYSICIAN: Hillary Bow, MD  DATE OF DISCHARGE: No discharge date for patient encounter.  PRIMARY CARE PHYSICIAN: Bobetta Lime, MD   ADMISSION DIAGNOSIS:  Community acquired pneumonia [J18.9] Elevated troponin [R79.89] Hemoptysis [R04.2]  DISCHARGE DIAGNOSIS:  Active Problems:   Pneumonia   SECONDARY DIAGNOSIS:   Past Medical History  Diagnosis Date  . Allergy   . Cancer ([place])   . Diabetes mellitus without complication ([place])   . Arthritis   . Asthma   . Cataract   . COPD (chronic obstructive pulmonary disease) ([place])   . Depression     sometimes  . Hyperlipidemia   . Hypertension   . Oxygen deficiency   . Major depressive disorder, recurrent, in full remission with anxious distress ([place]) 11/23/2014  . Vertebral fracture, osteoporotic ([place]) 08/07/2015  . Anxiety   . Pulmonary embolism ([place])     Diagnosis 07/2015 in [facility] [place] COURSE:   * Bilateral pneumonia- healthcare acquired pneumonia IV Zosyn, vancomycin and azithromycin. Vancomycin stopped on Day 2. sputum cultures ordered Repeat CT scan in 3 months to check for resolution and any lung tumors. Augmentin at discharge  * Hemoptysis Due to pneumonia and patient being on Xarelto. Has been on Xarelto for 3 months due to pulmonary embolism that was diagnosed in February 2017.. CT of the chest showed no PE.  Xarelto held till hemoptysis cleaed. Can restart at discharge.  * Mild elevation of troponin. This is likely demand ischemia. No history of CAD Repeat troponin trending down.  * Mild hyponatremia likely from dehydration.  Improved with IV fluids. She does have mild chronic hyponatremia.  * Hypokalemia Due to decreased oral intake. Replaced orally.  * Diabetes  mellitus Continue home medications and sliding scale insulin.  * Hypertension Continued medications  * Dementia Patient had severe inpatient delirium yesterday night. We'll order as needed Haldol.  * DVT prophylaxis in hospital With SCDs  Stable for discharge back to assisted living facility.  CONSULTS OBTAINED:    Physical therapy DRUG ALLERGIES:   Allergies  Allergen Reactions  . Ciprofloxacin Other (See Comments)    Reaction:  Unknown   . Inhaler Decongestant [Methamphetamine] Other (See Comments)    Reaction:  Unknown   . Molds & Smuts Other (See Comments)    Reaction:  Wheezing/itchy,watery eyes   . Albuterol Sulfate Palpitations and Other (See Comments)    Pt states that she is allergic to the [employer] [facility] brand only.      DISCHARGE MEDICATIONS:   Current Discharge Medication List    START taking these medications   Details  amoxicillin-clavulanate (AUGMENTIN) 875-125 MG tablet Take 1 tablet by mouth 2 (two) times daily. Qty: 12 tablet, Refills: 0      CONTINUE these medications which have NOT CHANGED   Details  albuterol (PROVENTIL HFA;VENTOLIN HFA) 108 (90 Base) MCG/ACT inhaler Inhale 1-2 puffs into the lungs every 4 (four) hours as needed for wheezing or shortness of breath. Qty: 1 Inhaler, Refills: 5   Associated Diagnoses: COPD, severe (HCC)    cloNIDine (CATAPRES) 0.1 MG tablet Take 0.1 mg by mouth 2 (two) times daily.    docusate sodium (COLACE) 100 MG capsule Take 1 capsule (100 mg total) by mouth 2 (two) times daily. Qty: 10  capsule, Refills: 0    donepezil (ARICEPT) 5 MG tablet Take 1 tablet (5 mg total) by mouth at bedtime. Qty: 30 tablet, Refills: 1   Associated Diagnoses: Dementia, without behavioral disturbance    furosemide (LASIX) 20 MG tablet Take 1 tablet (20 mg total) by mouth every morning. Qty: 90 tablet, Refills: 1    gemfibrozil (LOPID) 600 MG tablet Take 600 mg by mouth 2 (two) times daily before a meal.    glipiZIDE (GLUCOTROL) 10  MG tablet Take 10 mg by mouth daily.    loratadine (CLARITIN) 10 MG tablet Take 10 mg by mouth daily.     magnesium oxide (MAG-OX) 400 (241.3 Mg) MG tablet Take 1 tablet (400 mg total) by mouth 2 (two) times daily. Qty: 10 tablet, Refills: 0    meloxicam (MOBIC) 15 MG tablet Take 1 tablet (15 mg total) by mouth daily. Qty: 90 tablet, Refills: 2   Associated Diagnoses: Morning headache    metFORMIN (GLUCOPHAGE) 1000 MG tablet Take 1 tablet (1,000 mg total) by mouth 2 (two) times daily with a meal. Qty: 180 tablet, Refills: 1   Associated Diagnoses: Controlled type 2 diabetes mellitus with complication, without long-term current use of insulin (HCC)    mometasone (ELOCON) 0.1 % lotion Apply 1 application topically as needed (for dry,itchy skin). Reported on 08/22/2015    montelukast (SINGULAIR) 10 MG tablet Take 10 mg by mouth at bedtime.    nystatin (MYCOSTATIN) 100000 UNIT/ML suspension Take 5 mLs (500,000 Units total) by mouth 4 (four) times daily. Swish retain in mouth long as possible then swallow Qty: 120 mL, Refills: 0   Associated Diagnoses: Oral thrush    potassium chloride (K-DUR) 10 MEQ tablet Take 10 mEq by mouth 2 (two) times daily.    pravastatin (PRAVACHOL) 20 MG tablet Take 20 mg by mouth daily.    QUEtiapine (SEROQUEL) 50 MG tablet Take 50 mg by mouth daily.    sertraline (ZOLOFT) 100 MG tablet Take 100 mg by mouth daily.    tiotropium (SPIRIVA) 18 MCG inhalation capsule Place 18 mcg into inhaler and inhale at bedtime.    triamcinolone lotion (KENALOG) 0.1 % Apply 1 application topically 2 (two) times daily.     XARELTO 20 MG TABS tablet Take 20 mg by mouth daily.        Today   VITAL SIGNS:  Blood pressure 159/75, pulse 84, temperature 98.8 F (37.1 C), temperature source Oral, resp. rate 18, height 5\' 4"  (1.626 m), weight 68.947 kg (152 lb), SpO2 98 %.  I/O:   Intake/Output Summary (Last 24 hours) at 11/06/15 0954 Last data filed at 11/06/15 0837   Gross per 24 hour  Intake    240 ml  Output    100 ml  Net    140 ml    PHYSICAL EXAMINATION:  Physical Exam  GENERAL:  82 y.o.-year-old patient lying in the bed with no acute distress.  LUNGS: Normal breath sounds bilaterally, no wheezing, rales,rhonchi or crepitation. No use of accessory muscles of respiration.  CARDIOVASCULAR: S1, S2 normal. No murmurs, rubs, or gallops.  ABDOMEN: Soft, non-tender, non-distended. Bowel sounds present. No organomegaly or mass.  NEUROLOGIC: Moves all 4 extremities. PSYCHIATRIC: The patient is alert and awake SKIN: No obvious rash, lesion, or ulcer.   DATA REVIEW:   CBC  Recent Labs Lab 11/05/15 0010  WBC 9.6  HGB 8.7*  HCT 26.7*  PLT 424    Chemistries   Recent Labs Lab 11/04/15 1743 11/05/15 0010  NA  --  134*  K  --  3.0*  CL  --  99*  CO2  --  27  GLUCOSE  --  130*  BUN  --  7  CREATININE  --  0.38*  CALCIUM  --  9.1  MG 1.6*  --     Cardiac Enzymes  Recent Labs Lab 11/05/15 0010  TROPONINI 0.10*    Microbiology Results  Results for orders placed or performed during the hospital encounter of 11/04/15  MRSA PCR Screening     Status: None   Collection Time: 11/04/15  5:30 PM  Result Value Ref Range Status   MRSA by PCR NEGATIVE NEGATIVE Final    Comment:        The GeneXpert MRSA Assay (FDA approved for NASAL specimens only), is one component of a comprehensive MRSA colonization surveillance program. It is not intended to diagnose MRSA infection nor to guide or monitor treatment for MRSA infections.     RADIOLOGY:  Ct Angio Chest Pe W/cm &/or Wo Cm  11/04/2015  CLINICAL DATA:  Cough x 3-4 days, blood in sputum, history of blood clots EXAM: CT ANGIOGRAPHY CHEST WITH CONTRAST TECHNIQUE: Multidetector CT imaging of the chest was performed using the standard protocol during bolus administration of intravenous contrast. Multiplanar CT image reconstructions and MIPs were obtained to evaluate the vascular  anatomy. CONTRAST:  75 mL Isovue 370 IV COMPARISON:  Chest radiograph dated 11/04/2015. CT chest dated 07/23/2015. FINDINGS: No evidence of pulmonary embolism. Mediastinum/Nodes: The heart is normal in size. No pericardial effusion. Three vessel coronary atherosclerosis. Atherosclerotic calcifications of the aortic arch. Thoracic lymphadenopathy, progressed, including: --11 mm short axis right paratracheal node (series 4/ image 33) --12 mm short axis low right paratracheal node (series 4/ image 46) --11 mm short axis AP window node (series 4/ image 51) --14 mm short axis right hilar node (series 4/image 63) --22 mm short axis right infrahilar/lower lobe node (series 4/image 70) Visualized thyroid is notable for a dominant 16 mm left thyroid nodule, grossly unchanged. Lungs/Pleura: 2.6 x 2.4 cm spiculated nodular opacity in the lateral right upper lobe (series 6/ image 57), new. Adjacent satellite nodularity (series 6/image 67). 6.0 x 4.6 cm spiculated mass-like opacity in the left lower lobe (series 6/ image 83). Adjacent nodular opacities in the left lower lobe (series 6/ images 97 and 102). Additional nodular/mass-like opacities in the posterior lung bases (series 6/ image 103), with additional nodular opacities in the right lower lobe. These findings are all essentially new from the prior CT. Despite the spiculated/mass-like appearance of several of these lesions, the rapid progression from the prior suggests multifocal infection. Underlying moderate centrilobular emphysematous changes. Small left and trace right pleural effusions.  No pneumothorax. Upper abdomen: Visualized upper abdomen is grossly unremarkable. Musculoskeletal: Severe compression fracture deformity at T6, with vertebral planum. Mild to moderate inferior endplate compression fracture deformity at T5. Moderate compression fracture deformity at T12, new. Review of the MIP images confirms the above findings. IMPRESSION: No evidence pulmonary  embolism. Multiple patchy/ spiculated opacities in the right upper lobe and bilateral lower lobes, as described above, new from prior CT. Given rapid progression, the appearance favors multifocal infection. Follow-up CT chest is suggested in 6 weeks, after appropriate antimicrobial therapy. Small right and trace left pleural effusions. Progression of thoracic lymphadenopathy, possibly reactive. Progression of thoracic compression fracture deformities, as above. Electronically Signed   By: Julian Hy M.D.   On: 11/04/2015 14:26   Dg Chest Portable  1 View  11/04/2015  CLINICAL DATA:  82 year old with 1 week history of hemoptysis and right-sided chest pain. Personal history of left breast cancer. EXAM: PORTABLE CHEST 1 VIEW COMPARISON:  08/26/2015 and earlier, including CT chest 07/23/2015. FINDINGS: Cardiac silhouette mildly enlarged for AP portable technique, unchanged. Masslike opacity in the medial left lung base, new since the most recent examination 08/26/2015. Peripheral patchy airspace opacity in the right upper lobe, also new. Emphysematous changes throughout both lungs. Linear atelectasis in the lung bases, right greater than left, related to less than optimal inspiration. Pulmonary vascularity normal. IMPRESSION: 1. New masslike opacity in the medial left lung base since March, 2017. 2. New patchy peripheral airspace opacities in the inferior right upper lobe. 3. Mild bibasilar atelectasis. 4. Stable mild cardiomegaly without pulmonary edema. Electronically Signed   By: Evangeline Dakin M.D.   On: 11/04/2015 13:38    Follow up with PCP in 1 week.  Management plans discussed with the patient, family and they are in agreement.  CODE STATUS:     Code Status Orders        Start     Ordered   11/04/15 1457  Full code   Continuous     11/04/15 1458    Code Status History    Date Active Date Inactive Code Status Order ID Comments User Context   08/21/2015 11:46 PM 08/23/2015  2:34 PM Full  Code [id number]  Lance Coon, MD Inpatient   07/21/2015 10:05 PM 07/26/2015  4:22 PM Full Code [id number]  Idelle Crouch, MD Inpatient      TOTAL TIME TAKING CARE OF THIS PATIENT ON DAY OF DISCHARGE: more than 30 minutes.   Hillary Bow R M.D on 11/06/2015 at 9:54 AM  Between 7am to 6pm - Pager - [phone]  After 6pm go to [email or web address] - password [email or web address] [facility]  Office  [phone]  CC: Primary care physician; Bobetta Lime, MD  Note: This dictation was prepared with Dragon dictation along with smaller phrase technology. Any transcriptional errors that result from this process are unintentional.

## 2015-11-06 NOTE — Discharge Instructions (Signed)
°  DIET:  Diabetic diet  DISCHARGE CONDITION:  Stable  ACTIVITY:  Activity as tolerated  OXYGEN:  Home Oxygen: Yes.     Oxygen Delivery: 2 liters/min via Patient connected to nasal cannula oxygen  DISCHARGE LOCATION:  nursing home   If you experience worsening of your admission symptoms, develop shortness of breath, life threatening emergency, suicidal or homicidal thoughts you must seek medical attention immediately by calling 911 or calling your MD immediately  if symptoms less severe.  You Must read complete instructions/literature along with all the possible adverse reactions/side effects for all the Medicines you take and that have been prescribed to you. Take any new Medicines after you have completely understood and accpet all the possible adverse reactions/side effects.   Please note  You were cared for by a hospitalist during your hospital stay. If you have any questions about your discharge medications or the care you received while you were in the hospital after you are discharged, you can call the unit and asked to speak with the hospitalist on call if the hospitalist that took care of you is not available. Once you are discharged, your primary care physician will handle any further medical issues. Please note that NO REFILLS for any discharge medications will be authorized once you are discharged, as it is imperative that you return to your primary care physician (or establish a relationship with a primary care physician if you do not have one) for your aftercare needs so that they can reassess your need for medications and monitor your lab values.

## 2015-11-06 NOTE — NC FL2 (Addendum)
[place] LEVEL OF CARE SCREENING TOOL     IDENTIFICATION  Patient Name: Jocelyn Burgess Birthdate: November 14, 1931 Sex: female Admission Date (Current Location): 11/04/2015  [facility] and [place] Number:  [name]  ([id number] [facility]) Facility and Address:  [facility], [address], [place],  [address]      Provider Number: [id number]  Attending Physician Name and Address:  Hillary Bow, MD  Relative Name and Phone Number:       Current Level of Care: Hospital Recommended Level of Care: [place]  Prior Approval Number:    Date Approved/Denied:   PASRR Number:  ( [id number] )  Discharge Plan: Domiciliary (Rest home)  Memory Care     Current Diagnoses: Primary: Alzheimer's Dementia  Patient Active Problem List   Diagnosis Date Noted  . Pneumonia 11/04/2015  . Acute delirium 08/22/2015  . Sepsis ([place]) 08/21/2015  . Anxiety 08/21/2015  . UTI (lower urinary tract infection) 08/21/2015  . Dementia 08/08/2015  . Dyspnea on exertion 08/07/2015  . Vertebral fracture, osteoporotic ([place]) 08/07/2015  . Fracture of rib of left side with routine healing 08/07/2015  . AP (abdominal pain)   . Abdominal pain 07/21/2015  . Acute hyponatremia 07/21/2015  . Hypokalemia 07/21/2015  . Left hip pain 07/09/2015  . Benign essential tremor 07/09/2015  . Venous stasis dermatitis of both lower extremities 07/03/2015  . Bilateral edema of lower extremity 05/30/2015  . Bleeding nose 05/08/2015  . Morning headache 05/01/2015  . Intermittent confusion 04/17/2015  . Cough 04/17/2015  . Constipation, slow transit 04/09/2015  . Allergic rhinitis with postnasal drip 04/09/2015  . Hypertension goal BP (blood pressure) < 140/90 01/22/2015  . Major depressive disorder, recurrent, in full remission with anxious distress ([place]) 11/23/2014  . Heavy cigarette smoker 11/23/2014  . COPD, severe ([place]) 11/23/2014  . DD (diverticular  disease) 11/23/2014  . Personal history of malignant neoplasm of breast 11/23/2014  . History of digestive disease 11/23/2014  . Personal history of healed traumatic fracture 11/23/2014  . HLD (hyperlipidemia) 11/23/2014  . Peripheral neuropathic pain ([place]) 11/23/2014  . At risk for falling 11/23/2014  . Diabetes mellitus type 2, controlled, with complications ([place]) [id number]    Orientation RESPIRATION BLADDER Height & Weight     Self  O2 (2 Liters Chronic Oxygen ) Incontinent Weight: 152 lb (68.947 kg) Height:  5\' 4"  (162.6 cm)  BEHAVIORAL SYMPTOMS/MOOD NEUROLOGICAL BOWEL NUTRITION STATUS   (none )  (none ) Continent Diet (Diet: Carb Modified )  AMBULATORY STATUS COMMUNICATION OF NEEDS Skin   Supervision Verbally Normal                       Personal Care Assistance Level of Assistance  Bathing, Feeding, Dressing Bathing Assistance: Independent Feeding assistance: Independent Dressing Assistance: Independent     Functional Limitations Info  Sight, Hearing, Speech Sight Info: Adequate Hearing Info: Adequate Speech Info: Adequate    SPECIAL CARE FACTORS FREQUENCY  PT (By licensed PT)     PT Frequency:  (Home Health via [facility] 2-3 days per week. )              Contractures      Additional Factors Info  Code Status, Allergies Code Status Info:  (Full Code. ) Allergies Info:  (Ciprofloxacin, Inhaler Decongestant, Molds & Smuts, Albuterol Sulfate)          Discharge Medications: Please see discharge summary for a list of discharge medications. Current  Discharge Medication List    START taking these medications   Details  amoxicillin-clavulanate (AUGMENTIN) 875-125 MG tablet Take 1 tablet by mouth 2 (two) times daily. Qty: 12 tablet, Refills: 0      CONTINUE these medications which have NOT CHANGED   Details  albuterol (PROVENTIL HFA;VENTOLIN HFA) 108 (90 Base) MCG/ACT inhaler Inhale 1-2 puffs into the lungs every 4 (four) hours as  needed for wheezing or shortness of breath. Qty: 1 Inhaler, Refills: 5   Associated Diagnoses: COPD, severe (HCC)    cloNIDine (CATAPRES) 0.1 MG tablet Take 0.1 mg by mouth 2 (two) times daily.    docusate sodium (COLACE) 100 MG capsule Take 1 capsule (100 mg total) by mouth 2 (two) times daily. Qty: 10 capsule, Refills: 0    donepezil (ARICEPT) 5 MG tablet Take 1 tablet (5 mg total) by mouth at bedtime. Qty: 30 tablet, Refills: 1   Associated Diagnoses: Dementia, without behavioral disturbance    furosemide (LASIX) 20 MG tablet Take 1 tablet (20 mg total) by mouth every morning. Qty: 90 tablet, Refills: 1    gemfibrozil (LOPID) 600 MG tablet Take 600 mg by mouth 2 (two) times daily before a meal.    glipiZIDE (GLUCOTROL) 10 MG tablet Take 10 mg by mouth daily.    loratadine (CLARITIN) 10 MG tablet Take 10 mg by mouth daily.     magnesium oxide (MAG-OX) 400 (241.3 Mg) MG tablet Take 1 tablet (400 mg total) by mouth 2 (two) times daily. Qty: 10 tablet, Refills: 0    meloxicam (MOBIC) 15 MG tablet Take 1 tablet (15 mg total) by mouth daily. Qty: 90 tablet, Refills: 2   Associated Diagnoses: Morning headache    metFORMIN (GLUCOPHAGE) 1000 MG tablet Take 1 tablet (1,000 mg total) by mouth 2 (two) times daily with a meal. Qty: 180 tablet, Refills: 1   Associated Diagnoses: Controlled type 2 diabetes mellitus with complication, without long-term current use of insulin (HCC)    mometasone (ELOCON) 0.1 % lotion Apply 1 application topically as needed (for dry,itchy skin). Reported on 08/22/2015    montelukast (SINGULAIR) 10 MG tablet Take 10 mg by mouth at bedtime.    nystatin (MYCOSTATIN) 100000 UNIT/ML suspension Take 5 mLs (500,000 Units total) by mouth 4 (four) times daily. Swish retain in mouth long as possible then swallow Qty: 120 mL, Refills: 0   Associated Diagnoses: Oral thrush    potassium chloride (K-DUR) 10 MEQ tablet  Take 10 mEq by mouth 2 (two) times daily.    pravastatin (PRAVACHOL) 20 MG tablet Take 20 mg by mouth daily.    QUEtiapine (SEROQUEL) 50 MG tablet Take 50 mg by mouth daily.    sertraline (ZOLOFT) 100 MG tablet Take 100 mg by mouth daily.    tiotropium (SPIRIVA) 18 MCG inhalation capsule Place 18 mcg into inhaler and inhale at bedtime.    triamcinolone lotion (KENALOG) 0.1 % Apply 1 application topically 2 (two) times daily.     XARELTO 20 MG TABS tablet Take 20 mg by mouth daily.            Relevant Imaging Results:  Relevant Lab Results:   Additional Information  (SSN: [id number])  Loralyn Freshwater, LCSW

## 2015-11-06 NOTE — Progress Notes (Signed)
Patient is medically stable for D/C back to [name] today. Clinical Education officer, museum (CSW) contacted [employer] and left a voicemail. CSW also contacted [facility] resident care coordinator Tammy and made her aware of above. Per Tammy patient can return today. CSW sent D/C summary and FL2 to [facility]. Patient's husband Jocelyn Burgess will transport patient and has her walker and portable oxygen tank. Patient and husband aware of above. RN aware of above. Please reconsult if future social work needs arise. CSW signing off.   Blima Rich, LCSW [phone]

## 2015-11-07 DIAGNOSIS — R0609 Other forms of dyspnea: Secondary | ICD-10-CM | POA: Diagnosis not present

## 2015-11-07 DIAGNOSIS — J9690 Respiratory failure, unspecified, unspecified whether with hypoxia or hypercapnia: Secondary | ICD-10-CM | POA: Diagnosis not present

## 2015-11-12 ENCOUNTER — Emergency Department
Admission: EM | Admit: 2015-11-12 | Discharge: 2015-11-12 | Disposition: A | Discharge status: Home / Self Care | Payer: Medicare Other | Attending: Student | Admitting: Student

## 2015-11-12 ENCOUNTER — Emergency Department: Payer: Medicare Other

## 2015-11-12 ENCOUNTER — Encounter: Payer: Self-pay | Admitting: *Deleted

## 2015-11-12 DIAGNOSIS — Z79899 Other long term (current) drug therapy: Secondary | ICD-10-CM | POA: Insufficient documentation

## 2015-11-12 DIAGNOSIS — E11649 Type 2 diabetes mellitus with hypoglycemia without coma: Secondary | ICD-10-CM | POA: Diagnosis not present

## 2015-11-12 DIAGNOSIS — F3342 Major depressive disorder, recurrent, in full remission: Secondary | ICD-10-CM | POA: Insufficient documentation

## 2015-11-12 DIAGNOSIS — M1990 Unspecified osteoarthritis, unspecified site: Secondary | ICD-10-CM | POA: Insufficient documentation

## 2015-11-12 DIAGNOSIS — F17290 Nicotine dependence, other tobacco product, uncomplicated: Secondary | ICD-10-CM | POA: Diagnosis not present

## 2015-11-12 DIAGNOSIS — J449 Chronic obstructive pulmonary disease, unspecified: Secondary | ICD-10-CM | POA: Insufficient documentation

## 2015-11-12 DIAGNOSIS — I10 Essential (primary) hypertension: Secondary | ICD-10-CM | POA: Insufficient documentation

## 2015-11-12 DIAGNOSIS — R079 Chest pain, unspecified: Secondary | ICD-10-CM | POA: Diagnosis not present

## 2015-11-12 DIAGNOSIS — E785 Hyperlipidemia, unspecified: Secondary | ICD-10-CM | POA: Insufficient documentation

## 2015-11-12 DIAGNOSIS — Z8679 Personal history of other diseases of the circulatory system: Secondary | ICD-10-CM | POA: Insufficient documentation

## 2015-11-12 DIAGNOSIS — J45909 Unspecified asthma, uncomplicated: Secondary | ICD-10-CM | POA: Diagnosis not present

## 2015-11-12 DIAGNOSIS — Z7984 Long term (current) use of oral hypoglycemic drugs: Secondary | ICD-10-CM | POA: Insufficient documentation

## 2015-11-12 DIAGNOSIS — Z859 Personal history of malignant neoplasm, unspecified: Secondary | ICD-10-CM | POA: Diagnosis not present

## 2015-11-12 DIAGNOSIS — E162 Hypoglycemia, unspecified: Secondary | ICD-10-CM

## 2015-11-12 DIAGNOSIS — R0789 Other chest pain: Secondary | ICD-10-CM | POA: Insufficient documentation

## 2015-11-12 LAB — COMPREHENSIVE METABOLIC PANEL
ALT: 9 U/L — ABNORMAL LOW (ref 14–54)
AST: 17 U/L (ref 15–41)
Albumin: 3.3 g/dL — ABNORMAL LOW (ref 3.5–5.0)
Alkaline Phosphatase: 51 U/L (ref 38–126)
Anion gap: 8 (ref 5–15)
BILIRUBIN TOTAL: 0.3 mg/dL (ref 0.3–1.2)
BUN: 11 mg/dL (ref 6–20)
CHLORIDE: 101 mmol/L (ref 101–111)
CO2: 26 mmol/L (ref 22–32)
CREATININE: 0.45 mg/dL (ref 0.44–1.00)
Calcium: 9.3 mg/dL (ref 8.9–10.3)
Glucose, Bld: 43 mg/dL — CL (ref 65–99)
POTASSIUM: 2.9 mmol/L — AB (ref 3.5–5.1)
Sodium: 135 mmol/L (ref 135–145)
TOTAL PROTEIN: 7.1 g/dL (ref 6.5–8.1)

## 2015-11-12 LAB — URINALYSIS COMPLETE WITH MICROSCOPIC (ARMC ONLY)
Bacteria, UA: NONE SEEN
Bilirubin Urine: NEGATIVE
GLUCOSE, UA: NEGATIVE mg/dL
Hgb urine dipstick: NEGATIVE
Ketones, ur: NEGATIVE mg/dL
Leukocytes, UA: NEGATIVE
Nitrite: NEGATIVE
PROTEIN: NEGATIVE mg/dL
SPECIFIC GRAVITY, URINE: 1.011 (ref 1.005–1.030)
pH: 7 (ref 5.0–8.0)

## 2015-11-12 LAB — GLUCOSE, CAPILLARY
GLUCOSE-CAPILLARY: 38 mg/dL — AB (ref 65–99)
GLUCOSE-CAPILLARY: 132 mg/dL — AB (ref 65–99)
GLUCOSE-CAPILLARY: 61 mg/dL — AB (ref 65–99)
GLUCOSE-CAPILLARY: 99 mg/dL (ref 65–99)
GLUCOSE-CAPILLARY: 112 mg/dL — AB (ref 65–99)

## 2015-11-12 LAB — TROPONIN I: Troponin I: 0.03 ng/mL (ref <0.031)

## 2015-11-12 LAB — CBC WITH DIFFERENTIAL/PLATELET
Basophils Absolute: 0.1 10*3/uL (ref 0–0.1)
Basophils Relative: 1 %
Eosinophils Absolute: 0.1 10*3/uL (ref 0–0.7)
HEMATOCRIT: 25.7 % — AB (ref 35.0–47.0)
Hemoglobin: 8 g/dL — ABNORMAL LOW (ref 12.0–16.0)
LYMPHS ABS: 2 10*3/uL (ref 1.0–3.6)
MCH: 23.7 pg — AB (ref 26.0–34.0)
MCHC: 31.2 g/dL — ABNORMAL LOW (ref 32.0–36.0)
MCV: 75.9 fL — AB (ref 80.0–100.0)
MONO ABS: 0.8 10*3/uL (ref 0.2–0.9)
NEUTROS ABS: 5.5 10*3/uL (ref 1.4–6.5)
Neutrophils Relative %: 65 %
PLATELETS: 449 10*3/uL — AB (ref 150–440)
RBC: 3.39 MIL/uL — ABNORMAL LOW (ref 3.80–5.20)
RDW: 17.5 % — AB (ref 11.5–14.5)
WBC: 8.5 10*3/uL (ref 3.6–11.0)

## 2015-11-12 LAB — PROTIME-INR
INR: 1.1
PROTHROMBIN TIME: 14.4 s (ref 11.4–15.0)

## 2015-11-12 LAB — APTT: APTT: 29 s (ref 24–36)

## 2015-11-12 MED ORDER — DEXTROSE 50 % IV SOLN
1.0000 | Freq: Once | INTRAVENOUS | Status: AC
  Administered 2015-11-12: 50 mL via INTRAVENOUS

## 2015-11-12 MED ORDER — DEXTROSE 50 % IV SOLN
INTRAVENOUS | Status: DC
Start: 2015-11-12 — End: 2015-11-12
  Filled 2015-11-12: qty 50

## 2015-11-12 NOTE — ED Notes (Signed)
Patient left for xray.

## 2015-11-12 NOTE — ED Notes (Signed)
Patient given graham crackers, peanut butter and apple juice by Dr. Edd Fabian.

## 2015-11-12 NOTE — ED Notes (Signed)
Per EMS report, patient c/o right chest pain on and off for several days. Patient is unable to describe the quality of the pain. Patient is on 3L O2 via [place] at the assisted living.

## 2015-11-12 NOTE — ED Provider Notes (Signed)
[facility] Emergency Department Provider Note   ____________________________________________  Time seen: Approximately 12:14 PM  I have reviewed the triage vital signs and the nursing notes.   HISTORY  Chief Complaint Chest Pain  Caveat-history of present illness and review of systems is limited due to the patient's memory deficit. All information is obtained from EMS on their arrival as well as staff at her living facility.  HPI Jocelyn Burgess is a 82 y.o. female with diabetes, history of PE on Xarelto, COPD with 3 L home oxygen requirement, hypertension, hyperlipidemia, memory difficulty who presents for evaluation of intermittent right-sided chest pain ongoing for several months, gradual onset, intermittent, sometimes worse with movement. Patient was discharged from [facility] on 11/06/2015 after presenting for evaluation of chest discomfort, blood-tinged sputum, she had a CT angio of her chest at that time which was concerning for multifocal pneumonia and she was admitted for IV antibiotics and discharged with Augmentin. She reports that since that time she has continued to have some intermittent cough, chest pain, blood-tinged sputum. She denies any fevers or chills. No vomiting or diarrhea. She has had constipation. Staff at her living facility confirmed that the reason they sent her to the emergency department today was because at 11 AM she was complaining of shortness of breath and appeared weak and diaphoretic with stable albeit mildly hypertensive vital signs.   Past Medical History  Diagnosis Date  . Allergy   . Cancer ([place])   . Diabetes mellitus without complication ([place])   . Arthritis   . Asthma   . Cataract   . COPD (chronic obstructive pulmonary disease) ([place])   . Depression     sometimes  . Hyperlipidemia   . Hypertension   . Oxygen deficiency   . Major depressive disorder, recurrent, in full remission with anxious  distress ([place]) 11/23/2014  . Vertebral fracture, osteoporotic ([place]) 08/07/2015  . Anxiety   . Pulmonary embolism ([place])     Diagnosis 07/2015 in [place]. On Xarelto    Patient Active Problem List   Diagnosis Date Noted  . Pneumonia 11/04/2015  . Acute delirium 08/22/2015  . Sepsis ([place]) 08/21/2015  . Anxiety 08/21/2015  . UTI (lower urinary tract infection) 08/21/2015  . Dementia 08/08/2015  . Dyspnea on exertion 08/07/2015  . Vertebral fracture, osteoporotic ([place]) 08/07/2015  . Fracture of rib of left side with routine healing 08/07/2015  . AP (abdominal pain)   . Abdominal pain 07/21/2015  . Acute hyponatremia 07/21/2015  . Hypokalemia 07/21/2015  . Left hip pain 07/09/2015  . Benign essential tremor 07/09/2015  . Venous stasis dermatitis of both lower extremities 07/03/2015  . Bilateral edema of lower extremity 05/30/2015  . Bleeding nose 05/08/2015  . Morning headache 05/01/2015  . Intermittent confusion 04/17/2015  . Cough 04/17/2015  . Constipation, slow transit 04/09/2015  . Allergic rhinitis with postnasal drip 04/09/2015  . Hypertension goal BP (blood pressure) < 140/90 01/22/2015  . Major depressive disorder, recurrent, in full remission with anxious distress ([place]) 11/23/2014  . Heavy cigarette smoker 11/23/2014  . COPD, severe ([place]) 11/23/2014  . DD (diverticular disease) 11/23/2014  . Personal history of malignant neoplasm of breast 11/23/2014  . History of digestive disease 11/23/2014  . Personal history of healed traumatic fracture 11/23/2014  . HLD (hyperlipidemia) 11/23/2014  . Peripheral neuropathic pain ([place]) 11/23/2014  . At risk for falling 11/23/2014  . Diabetes mellitus type 2, controlled, with complications ([place]) [id number]    Past  Surgical History  Procedure Laterality Date  . Breast surgery Left     Current Outpatient Rx  Name  Route  Sig  Dispense  Refill  . acetaminophen (TYLENOL) 325 MG tablet   Oral   Take 650 mg by mouth every 4  (four) hours as needed for mild pain, fever or headache.         . albuterol (PROVENTIL HFA;VENTOLIN HFA) 108 (90 Base) MCG/ACT inhaler   Inhalation   Inhale 1-2 puffs into the lungs every 4 (four) hours as needed for wheezing or shortness of breath.   1 Inhaler   5     PATIENT ALLERGIC TO PROAIR   . amoxicillin-clavulanate (AUGMENTIN) 875-125 MG tablet   Oral   Take 1 tablet by mouth 2 (two) times daily.   12 tablet   0   . cloNIDine (CATAPRES) 0.1 MG tablet   Oral   Take 0.1 mg by mouth 2 (two) times daily.         Marland Kitchen docusate sodium (COLACE) 100 MG capsule   Oral   Take 1 capsule (100 mg total) by mouth 2 (two) times daily.   10 capsule   0   . donepezil (ARICEPT) 5 MG tablet   Oral   Take 1 tablet (5 mg total) by mouth at bedtime.   30 tablet   1   . furosemide (LASIX) 20 MG tablet   Oral   Take 1 tablet (20 mg total) by mouth every morning.   90 tablet   1   . glipiZIDE (GLUCOTROL) 10 MG tablet   Oral   Take 10 mg by mouth daily before breakfast.          . loratadine (CLARITIN) 10 MG tablet   Oral   Take 10 mg by mouth daily.          Marland Kitchen LORazepam (ATIVAN) 0.5 MG tablet   Oral   Take 0.5 mg by mouth every 4 (four) hours as needed for anxiety.         . magnesium oxide (MAG-OX) 400 (241.3 Mg) MG tablet   Oral   Take 1 tablet (400 mg total) by mouth 2 (two) times daily.   10 tablet   0   . meloxicam (MOBIC) 15 MG tablet   Oral   Take 1 tablet (15 mg total) by mouth daily.   90 tablet   2   . metFORMIN (GLUCOPHAGE) 1000 MG tablet   Oral   Take 1 tablet (1,000 mg total) by mouth 2 (two) times daily with a meal.   180 tablet   1     Disregard previous doses and RXs for this medicati ...   . mometasone (ELOCON) 0.1 % cream   Topical   Apply 1 application topically as needed (for dry skin).         . montelukast (SINGULAIR) 10 MG tablet   Oral   Take 10 mg by mouth at bedtime.         Marland Kitchen nystatin (MYCOSTATIN) 100000 UNIT/ML  suspension   Oral   Take 5 mLs by mouth 4 (four) times daily. Pt is to swish and swallow.         . polyethylene glycol (MIRALAX / GLYCOLAX) packet   Oral   Take 17 g by mouth daily.         . potassium chloride (K-DUR) 10 MEQ tablet   Oral   Take 10 mEq by mouth daily.          Marland Kitchen  pravastatin (PRAVACHOL) 20 MG tablet   Oral   Take 20 mg by mouth at bedtime.          Marland Kitchen QUEtiapine (SEROQUEL) 50 MG tablet   Oral   Take 50 mg by mouth 2 (two) times daily.          . sertraline (ZOLOFT) 100 MG tablet   Oral   Take 100 mg by mouth daily.         Marland Kitchen tiotropium (SPIRIVA) 18 MCG inhalation capsule   Inhalation   Place 18 mcg into inhaler and inhale daily.          Marland Kitchen triamcinolone lotion (KENALOG) 0.1 %   Topical   Apply 1 application topically 2 (two) times daily.          Alveda Reasons 20 MG TABS tablet   Oral   Take 20 mg by mouth every evening.            Dispense as written.     Allergies Ciprofloxacin; Codeine; Inhaler decongestant; Molds & smuts; Other; and Albuterol sulfate  Family History  Problem Relation Age of Onset  . Arthritis Mother   . Arthritis Father   . Cancer Sister     brain  . Heart disease Sister   . Stroke Sister   . Diabetes Brother     Social History Social History  Substance Use Topics  . Smoking status: Former Smoker -- 1.00 packs/day for 45 years    Types: Cigarettes  . Smokeless tobacco: Current User  . Alcohol Use: No    Review of Systems Constitutional: No fever/chills Eyes: No visual changes. ENT: No sore throat. Cardiovascular: + chest pain. Respiratory: Denies shortness of breath. Gastrointestinal: No abdominal pain.  No nausea, no vomiting.  No diarrhea.  No constipation. Genitourinary: Negative for dysuria. Musculoskeletal: Negative for back pain. Skin: Negative for rash. Neurological: Negative for headaches, focal weakness or numbness.  10-point ROS otherwise  negative.  ____________________________________________   PHYSICAL EXAM:  VITAL SIGNS: ED Triage Vitals  Enc Vitals Group     BP 11/12/15 1206 141/84 mmHg     Pulse Rate 11/12/15 1206 85     Resp 11/12/15 1206 18     Temp 11/12/15 1206 98.4 F (36.9 C)     Temp Source 11/12/15 1206 Oral     SpO2 11/12/15 1200 96 %     Weight 11/12/15 1206 142 lb 3.2 oz (64.5 kg)     Height 11/12/15 1206 5\' 6"  (1.676 m)     Head Cir --      Peak Flow --      Pain Score 11/12/15 1211 1     Pain Loc --      Pain Edu? --      Excl. in [place]? --     Constitutional: Alert and oriented to self but not to year or place. Pleasantly confused. Well appearing and in no acute distress. Eyes: Conjunctivae are normal. PERRL. EOMI. Head: Atraumatic. Nose: No congestion/rhinnorhea. Mouth/Throat: Mucous membranes are moist.  Oropharynx non-erythematous. Neck: No stridor. Supple without meningismus. Cardiovascular: Normal rate, regular rhythm. Grossly normal heart sounds.  Good peripheral circulation. Respiratory: Normal respiratory effort.  No retractions. Lungs CTAB. Gastrointestinal: Soft and nontender. No distention.  No CVA tenderness. Genitourinary: deferred Musculoskeletal: No lower extremity tenderness nor edema.  No joint effusions. No calf swelling/asymmetry/edema. Neurologic:  Normal speech and language. No gross focal neurologic deficits are appreciated.  Skin:  Skin is warm, dry and intact.  No rash noted. Psychiatric: Mood and affect are normal. Speech and behavior are normal.  ____________________________________________   LABS (all labs ordered are listed, but only abnormal results are displayed)  Labs Reviewed  CBC WITH DIFFERENTIAL/PLATELET - Abnormal; Notable for the following:    RBC 3.39 (*)    Hemoglobin 8.0 (*)    HCT 25.7 (*)    MCV 75.9 (*)    MCH 23.7 (*)    MCHC 31.2 (*)    RDW 17.5 (*)    Platelets 449 (*)    All other components within normal limits  COMPREHENSIVE  METABOLIC PANEL - Abnormal; Notable for the following:    Potassium 2.9 (*)    Glucose, Bld 43 (*)    Albumin 3.3 (*)    ALT 9 (*)    All other components within normal limits  URINALYSIS COMPLETEWITH MICROSCOPIC ([facility] ONLY) - Abnormal; Notable for the following:    Color, Urine YELLOW (*)    APPearance CLEAR (*)    Squamous Epithelial / LPF 0-5 (*)    All other components within normal limits  GLUCOSE, CAPILLARY - Abnormal; Notable for the following:    Glucose-Capillary 38 (*)    All other components within normal limits  GLUCOSE, CAPILLARY - Abnormal; Notable for the following:    Glucose-Capillary 132 (*)    All other components within normal limits  GLUCOSE, CAPILLARY - Abnormal; Notable for the following:    Glucose-Capillary 61 (*)    All other components within normal limits  GLUCOSE, CAPILLARY - Abnormal; Notable for the following:    Glucose-Capillary 112 (*)    All other components within normal limits  TROPONIN I  PROTIME-INR  APTT  TROPONIN I  GLUCOSE, CAPILLARY  CBG MONITORING, ED  CBG MONITORING, ED   ____________________________________________  EKG  ED ECG REPORT I, Joanne Gavel, the attending physician, personally viewed and interpreted this ECG.   Date: 11/12/2015  EKG Time: 13:32  Rate: 86  Rhythm: normal EKG, normal sinus rhythm  Axis: normal  Intervals:none  ST&T Change: No acute ST elevation.  ____________________________________________  RADIOLOGY  CXR IMPRESSION: COPD/chronic bronchitis. Similar to minimally improved right upper and improved left lower lobe pulmonary opacities, most consistent with infection.  Trace bilateral pleural fluid.  No acute superimposed explanation for chest pain. ____________________________________________   PROCEDURES  Procedure(s) performed: None  Critical Care performed: No  ____________________________________________   INITIAL IMPRESSION / ASSESSMENT AND PLAN / ED COURSE  Pertinent  labs & imaging results that were available during my care of the patient were reviewed by me and considered in my medical decision making (see chart for details).  Krisa CARMELL DUPIN is a 82 y.o. female with diabetes, history of PE on Xarelto, COPD with 3 L home oxygen requirement, hypertension, hyperlipidemia, memory difficulty who presents for evaluation of intermittent right-sided chest pain ongoing for several months as well as an episode of generalized weakness and diaphoresis at 11 AM today. She was recently treated for community-acquired pneumonia as well as troponin elevation. Currently she denies any chest pain. CTA chest obtained just a few days ago showed no pulmonary embolism and she is already anticoagulated on Xarelto so I doubt this is a likely diagnosis. She is generally well-appearing in no acute distress. Her vital signs are stable. She is afebrile. Her O2 saturation is 98% on 3 L home oxygen requirement. We'll obtain screening cardiac labs, chest x-ray, EKG, reassess for disposition any further advanced imaging. ----------------------------------------- 2:18 PM on 11/12/2015 -----------------------------------------  Patient was found to be hypoglycemic with glucose of 38, improves to 132 after D50. I suspect this could have caused her weak, diaphoretic episode earlier today. We'll continue to monitor.  ----------------------------------------- 5:51 PM on 11/12/2015 ----------------------------------------- Patient continues to appear comfortable, she has no acute complaints. Her chest x-ray shows improving opacities secondary to pneumonia, she will continue with Augmentin. She has been observed for several hours in the emergency department and her blood sugar has stabilized with eating. She has had several normal glucoses over the past few hours and she has not required any additional D50. 2 sets of troponins are negative, doubt ACS, normal EKG is reassuring. Urinalysis is not consistent  with infection. CBC shows anemia, hemoglobin 8.0 down only slightly from 8.7 just 1 week ago. CMP with mild hypokalemia, potassium is 2.9 however this appears to be chronic on chart review. We'll DC with return precautions and close PCP follow-up. Her husband at bedside is comfortable with the discharge plan.   ____________________________________________   FINAL CLINICAL IMPRESSION(S) / ED DIAGNOSES  Final diagnoses:  Chest pain, unspecified chest pain type  Hypoglycemia      NEW MEDICATIONS STARTED DURING THIS VISIT:  New Prescriptions   No medications on file     Note:  This document was prepared using Dragon voice recognition software and may include unintentional dictation errors.    Joanne Gavel, MD 11/12/15 [phone]

## 2015-11-12 NOTE — Discharge Instructions (Signed)
Jocelyn Burgess is seen in the emergency department today for episodes where she was weak, confused and sweaty. Her blood sugar was quite low when she arrived to the emergency department. Please do not give her Glucotrol or metformin today and have her be seen by her primary care doctor tomorrow so that her antihyperglycemic medications can be adjusted. She should return to the emergency department if she develops any recurrent/severe chest pain, sweating episodes, trouble breathing, fevers, vomiting, diarrhea or for any other concerns.

## 2015-11-12 NOTE — ED Notes (Signed)
Tech Toni at bedside for In and Out cath. Patient tolerated procedure well.

## 2015-12-03 DIAGNOSIS — J449 Chronic obstructive pulmonary disease, unspecified: Secondary | ICD-10-CM | POA: Diagnosis not present

## 2015-12-07 DIAGNOSIS — J3089 Other allergic rhinitis: Secondary | ICD-10-CM | POA: Diagnosis not present

## 2015-12-07 DIAGNOSIS — R600 Localized edema: Secondary | ICD-10-CM | POA: Diagnosis not present

## 2015-12-07 DIAGNOSIS — M150 Primary generalized (osteo)arthritis: Secondary | ICD-10-CM | POA: Diagnosis not present

## 2015-12-07 DIAGNOSIS — E785 Hyperlipidemia, unspecified: Secondary | ICD-10-CM | POA: Diagnosis not present

## 2015-12-07 DIAGNOSIS — I10 Essential (primary) hypertension: Secondary | ICD-10-CM | POA: Diagnosis not present

## 2015-12-08 DIAGNOSIS — J9690 Respiratory failure, unspecified, unspecified whether with hypoxia or hypercapnia: Secondary | ICD-10-CM | POA: Diagnosis not present

## 2015-12-08 DIAGNOSIS — R0609 Other forms of dyspnea: Secondary | ICD-10-CM | POA: Diagnosis not present

## 2015-12-17 ENCOUNTER — Emergency Department (HOSPITAL_COMMUNITY): Payer: Medicare Other

## 2015-12-17 ENCOUNTER — Encounter (HOSPITAL_COMMUNITY): Payer: Self-pay | Admitting: Emergency Medicine

## 2015-12-17 ENCOUNTER — Inpatient Hospital Stay (HOSPITAL_COMMUNITY)
Admission: EM | Admit: 2015-12-17 | Discharge: 2015-12-24 | DRG: 100 | Disposition: A | Discharge status: Hospice | Payer: Medicare Other | Attending: Internal Medicine | Admitting: Internal Medicine

## 2015-12-17 DIAGNOSIS — G40409 Other generalized epilepsy and epileptic syndromes, not intractable, without status epilepticus: Principal | ICD-10-CM | POA: Diagnosis present

## 2015-12-17 DIAGNOSIS — I110 Hypertensive heart disease with heart failure: Secondary | ICD-10-CM | POA: Diagnosis not present

## 2015-12-17 DIAGNOSIS — Z86718 Personal history of other venous thrombosis and embolism: Secondary | ICD-10-CM | POA: Diagnosis not present

## 2015-12-17 DIAGNOSIS — I509 Heart failure, unspecified: Secondary | ICD-10-CM | POA: Diagnosis present

## 2015-12-17 DIAGNOSIS — Z66 Do not resuscitate: Secondary | ICD-10-CM | POA: Diagnosis present

## 2015-12-17 DIAGNOSIS — Z86711 Personal history of pulmonary embolism: Secondary | ICD-10-CM

## 2015-12-17 DIAGNOSIS — R918 Other nonspecific abnormal finding of lung field: Secondary | ICD-10-CM | POA: Diagnosis not present

## 2015-12-17 DIAGNOSIS — Z881 Allergy status to other antibiotic agents status: Secondary | ICD-10-CM

## 2015-12-17 DIAGNOSIS — F0390 Unspecified dementia without behavioral disturbance: Secondary | ICD-10-CM | POA: Diagnosis present

## 2015-12-17 DIAGNOSIS — J449 Chronic obstructive pulmonary disease, unspecified: Secondary | ICD-10-CM | POA: Diagnosis not present

## 2015-12-17 DIAGNOSIS — G4089 Other seizures: Secondary | ICD-10-CM | POA: Diagnosis not present

## 2015-12-17 DIAGNOSIS — I63312 Cerebral infarction due to thrombosis of left middle cerebral artery: Secondary | ICD-10-CM | POA: Diagnosis not present

## 2015-12-17 DIAGNOSIS — S22089D Unspecified fracture of T11-T12 vertebra, subsequent encounter for fracture with routine healing: Secondary | ICD-10-CM | POA: Diagnosis not present

## 2015-12-17 DIAGNOSIS — I2699 Other pulmonary embolism without acute cor pulmonale: Secondary | ICD-10-CM | POA: Diagnosis not present

## 2015-12-17 DIAGNOSIS — J9600 Acute respiratory failure, unspecified whether with hypoxia or hypercapnia: Secondary | ICD-10-CM | POA: Diagnosis not present

## 2015-12-17 DIAGNOSIS — G8194 Hemiplegia, unspecified affecting left nondominant side: Secondary | ICD-10-CM | POA: Insufficient documentation

## 2015-12-17 DIAGNOSIS — I639 Cerebral infarction, unspecified: Secondary | ICD-10-CM | POA: Diagnosis not present

## 2015-12-17 DIAGNOSIS — Z91018 Allergy to other foods: Secondary | ICD-10-CM | POA: Diagnosis not present

## 2015-12-17 DIAGNOSIS — R1084 Generalized abdominal pain: Secondary | ICD-10-CM | POA: Diagnosis not present

## 2015-12-17 DIAGNOSIS — Z853 Personal history of malignant neoplasm of breast: Secondary | ICD-10-CM | POA: Diagnosis not present

## 2015-12-17 DIAGNOSIS — J9811 Atelectasis: Secondary | ICD-10-CM | POA: Diagnosis not present

## 2015-12-17 DIAGNOSIS — F17290 Nicotine dependence, other tobacco product, uncomplicated: Secondary | ICD-10-CM | POA: Diagnosis not present

## 2015-12-17 DIAGNOSIS — J690 Pneumonitis due to inhalation of food and vomit: Secondary | ICD-10-CM | POA: Diagnosis not present

## 2015-12-17 DIAGNOSIS — E118 Type 2 diabetes mellitus with unspecified complications: Secondary | ICD-10-CM | POA: Diagnosis present

## 2015-12-17 DIAGNOSIS — J189 Pneumonia, unspecified organism: Secondary | ICD-10-CM | POA: Diagnosis present

## 2015-12-17 DIAGNOSIS — Z9981 Dependence on supplemental oxygen: Secondary | ICD-10-CM

## 2015-12-17 DIAGNOSIS — E785 Hyperlipidemia, unspecified: Secondary | ICD-10-CM | POA: Diagnosis present

## 2015-12-17 DIAGNOSIS — R0602 Shortness of breath: Secondary | ICD-10-CM

## 2015-12-17 DIAGNOSIS — Z79899 Other long term (current) drug therapy: Secondary | ICD-10-CM | POA: Diagnosis not present

## 2015-12-17 DIAGNOSIS — E119 Type 2 diabetes mellitus without complications: Secondary | ICD-10-CM | POA: Diagnosis not present

## 2015-12-17 DIAGNOSIS — R569 Unspecified convulsions: Secondary | ICD-10-CM | POA: Diagnosis not present

## 2015-12-17 DIAGNOSIS — I6789 Other cerebrovascular disease: Secondary | ICD-10-CM | POA: Diagnosis not present

## 2015-12-17 DIAGNOSIS — R4020 Unspecified coma: Secondary | ICD-10-CM | POA: Diagnosis not present

## 2015-12-17 DIAGNOSIS — D649 Anemia, unspecified: Secondary | ICD-10-CM | POA: Diagnosis present

## 2015-12-17 DIAGNOSIS — J90 Pleural effusion, not elsewhere classified: Secondary | ICD-10-CM | POA: Diagnosis not present

## 2015-12-17 DIAGNOSIS — I63011 Cerebral infarction due to thrombosis of right vertebral artery: Secondary | ICD-10-CM | POA: Diagnosis not present

## 2015-12-17 DIAGNOSIS — F329 Major depressive disorder, single episode, unspecified: Secondary | ICD-10-CM | POA: Diagnosis present

## 2015-12-17 DIAGNOSIS — Y95 Nosocomial condition: Secondary | ICD-10-CM | POA: Diagnosis present

## 2015-12-17 DIAGNOSIS — K922 Gastrointestinal hemorrhage, unspecified: Secondary | ICD-10-CM

## 2015-12-17 DIAGNOSIS — Z515 Encounter for palliative care: Secondary | ICD-10-CM | POA: Diagnosis not present

## 2015-12-17 DIAGNOSIS — Z888 Allergy status to other drugs, medicaments and biological substances status: Secondary | ICD-10-CM

## 2015-12-17 DIAGNOSIS — R4701 Aphasia: Secondary | ICD-10-CM | POA: Diagnosis present

## 2015-12-17 DIAGNOSIS — Z885 Allergy status to narcotic agent status: Secondary | ICD-10-CM | POA: Diagnosis not present

## 2015-12-17 DIAGNOSIS — G459 Transient cerebral ischemic attack, unspecified: Secondary | ICD-10-CM

## 2015-12-17 DIAGNOSIS — Z7901 Long term (current) use of anticoagulants: Secondary | ICD-10-CM | POA: Diagnosis not present

## 2015-12-17 DIAGNOSIS — R441 Visual hallucinations: Secondary | ICD-10-CM | POA: Diagnosis present

## 2015-12-17 DIAGNOSIS — E876 Hypokalemia: Secondary | ICD-10-CM | POA: Diagnosis not present

## 2015-12-17 DIAGNOSIS — I679 Cerebrovascular disease, unspecified: Secondary | ICD-10-CM | POA: Diagnosis not present

## 2015-12-17 DIAGNOSIS — J9621 Acute and chronic respiratory failure with hypoxia: Secondary | ICD-10-CM | POA: Diagnosis present

## 2015-12-17 DIAGNOSIS — G8384 Todd's paralysis (postepileptic): Secondary | ICD-10-CM | POA: Diagnosis present

## 2015-12-17 DIAGNOSIS — R911 Solitary pulmonary nodule: Secondary | ICD-10-CM | POA: Diagnosis present

## 2015-12-17 DIAGNOSIS — R195 Other fecal abnormalities: Secondary | ICD-10-CM

## 2015-12-17 DIAGNOSIS — Z7189 Other specified counseling: Secondary | ICD-10-CM | POA: Diagnosis not present

## 2015-12-17 DIAGNOSIS — R29818 Other symptoms and signs involving the nervous system: Secondary | ICD-10-CM | POA: Diagnosis not present

## 2015-12-17 DIAGNOSIS — R109 Unspecified abdominal pain: Secondary | ICD-10-CM

## 2015-12-17 DIAGNOSIS — J9601 Acute respiratory failure with hypoxia: Secondary | ICD-10-CM | POA: Diagnosis not present

## 2015-12-17 HISTORY — DX: Unspecified convulsions: R56.9

## 2015-12-17 HISTORY — DX: Anemia, unspecified: D64.9

## 2015-12-17 LAB — COMPREHENSIVE METABOLIC PANEL
ALBUMIN: 3.4 g/dL — AB (ref 3.5–5.0)
ALT: 17 U/L (ref 14–54)
ANION GAP: 14 (ref 5–15)
AST: 21 U/L (ref 15–41)
Alkaline Phosphatase: 59 U/L (ref 38–126)
BILIRUBIN TOTAL: 0.4 mg/dL (ref 0.3–1.2)
BUN: 13 mg/dL (ref 6–20)
CHLORIDE: 101 mmol/L (ref 101–111)
CO2: 19 mmol/L — ABNORMAL LOW (ref 22–32)
Calcium: 9.1 mg/dL (ref 8.9–10.3)
Creatinine, Ser: 0.76 mg/dL (ref 0.44–1.00)
GFR calc Af Amer: >60 mL/min (ref >60)
GFR calc non Af Amer: >60 mL/min (ref >60)
GLUCOSE: 269 mg/dL — AB (ref 65–99)
POTASSIUM: 2.5 mmol/L — AB (ref 3.5–5.1)
SODIUM: 134 mmol/L — AB (ref 135–145)
TOTAL PROTEIN: 7.3 g/dL (ref 6.5–8.1)

## 2015-12-17 LAB — CBC
HEMATOCRIT: 23.1 % — AB (ref 36.0–46.0)
HEMOGLOBIN: 6.5 g/dL — AB (ref 12.0–15.0)
MCH: 20.6 pg — ABNORMAL LOW (ref 26.0–34.0)
MCHC: 28.1 g/dL — AB (ref 30.0–36.0)
MCV: 73.3 fL — ABNORMAL LOW (ref 78.0–100.0)
Platelets: 452 10*3/uL — ABNORMAL HIGH (ref 150–400)
RBC: 3.15 MIL/uL — AB (ref 3.87–5.11)
RDW: 18 % — ABNORMAL HIGH (ref 11.5–15.5)
WBC: 9.1 10*3/uL (ref 4.0–10.5)

## 2015-12-17 LAB — URINALYSIS, ROUTINE W REFLEX MICROSCOPIC
BILIRUBIN URINE: NEGATIVE
GLUCOSE, UA: 100 mg/dL — AB
HGB URINE DIPSTICK: NEGATIVE
KETONES UR: 15 mg/dL — AB
Leukocytes, UA: NEGATIVE
NITRITE: NEGATIVE
PH: 7 (ref 5.0–8.0)
Protein, ur: NEGATIVE mg/dL
Specific Gravity, Urine: 1.013 (ref 1.005–1.030)

## 2015-12-17 LAB — I-STAT CHEM 8, ED
BUN: 13 mg/dL (ref 6–20)
CREATININE: 0.5 mg/dL (ref 0.44–1.00)
Calcium, Ion: 1.13 mmol/L (ref 1.12–1.23)
Chloride: 99 mmol/L — ABNORMAL LOW (ref 101–111)
GLUCOSE: 265 mg/dL — AB (ref 65–99)
HCT: 25 % — ABNORMAL LOW (ref 36.0–46.0)
HEMOGLOBIN: 8.5 g/dL — AB (ref 12.0–15.0)
POTASSIUM: 2.6 mmol/L — AB (ref 3.5–5.1)
Sodium: 137 mmol/L (ref 135–145)
TCO2: 20 mmol/L (ref 0–100)

## 2015-12-17 LAB — RAPID URINE DRUG SCREEN, HOSP PERFORMED
AMPHETAMINES: NOT DETECTED
Barbiturates: NOT DETECTED
Benzodiazepines: NOT DETECTED
Cocaine: NOT DETECTED
OPIATES: NOT DETECTED
TETRAHYDROCANNABINOL: NOT DETECTED

## 2015-12-17 LAB — GLUCOSE, CAPILLARY: GLUCOSE-CAPILLARY: 174 mg/dL — AB (ref 65–99)

## 2015-12-17 LAB — RETICULOCYTES
RBC.: 2.96 MIL/uL — AB (ref 3.87–5.11)
RETIC CT PCT: 2.5 % (ref 0.4–3.1)
Retic Count, Absolute: 74 10*3/uL (ref 19.0–186.0)

## 2015-12-17 LAB — DIFFERENTIAL
BASOS ABS: 0 10*3/uL (ref 0.0–0.1)
Basophils Relative: 0 %
EOS ABS: 0.1 10*3/uL (ref 0.0–0.7)
EOS PCT: 1 %
LYMPHS PCT: 13 %
Lymphs Abs: 1.2 10*3/uL (ref 0.7–4.0)
MONO ABS: 0.5 10*3/uL (ref 0.1–1.0)
MONOS PCT: 6 %
Neutro Abs: 7.3 10*3/uL (ref 1.7–7.7)
Neutrophils Relative %: 80 %

## 2015-12-17 LAB — FOLATE: Folate: 12.4 ng/mL (ref >5.9)

## 2015-12-17 LAB — I-STAT ARTERIAL BLOOD GAS, ED
Acid-Base Excess: 2 mmol/L (ref 0.0–2.0)
BICARBONATE: 26 meq/L — AB (ref 20.0–24.0)
O2 SAT: 98 %
PCO2 ART: 39.2 mmHg (ref 35.0–45.0)
PO2 ART: 104 mmHg — AB (ref 80.0–100.0)
Patient temperature: 98.6
TCO2: 27 mmol/L (ref 0–100)
pH, Arterial: 7.43 (ref 7.350–7.450)

## 2015-12-17 LAB — VITAMIN B12: VITAMIN B 12: 349 pg/mL (ref 180–914)

## 2015-12-17 LAB — PHOSPHORUS: PHOSPHORUS: 3 mg/dL (ref 2.5–4.6)

## 2015-12-17 LAB — POC OCCULT BLOOD, ED: Fecal Occult Bld: POSITIVE — AB

## 2015-12-17 LAB — PREPARE RBC (CROSSMATCH)

## 2015-12-17 LAB — IRON AND TIBC
Iron: 10 ug/dL — ABNORMAL LOW (ref 28–170)
SATURATION RATIOS: 3 % — AB (ref 10.4–31.8)
TIBC: 328 ug/dL (ref 250–450)
UIBC: 318 ug/dL

## 2015-12-17 LAB — ETHANOL: Alcohol, Ethyl (B): <5 mg/dL (ref <5)

## 2015-12-17 LAB — I-STAT TROPONIN, ED: Troponin i, poc: 0.04 ng/mL (ref 0.00–0.08)

## 2015-12-17 LAB — PROTIME-INR
INR: 2.7 — ABNORMAL HIGH (ref 0.00–1.49)
Prothrombin Time: 28.3 seconds — ABNORMAL HIGH (ref 11.6–15.2)

## 2015-12-17 LAB — MAGNESIUM: MAGNESIUM: 1.3 mg/dL — AB (ref 1.7–2.4)

## 2015-12-17 LAB — ABO/RH: ABO/RH(D): A POS

## 2015-12-17 LAB — FERRITIN: FERRITIN: 17 ng/mL (ref 11–307)

## 2015-12-17 LAB — APTT: APTT: 33 s (ref 24–37)

## 2015-12-17 MED ORDER — DIPHENHYDRAMINE HCL 25 MG PO CAPS
25.0000 mg | ORAL_CAPSULE | Freq: Once | ORAL | Status: AC
  Administered 2015-12-17: 25 mg via ORAL
  Filled 2015-12-17: qty 1

## 2015-12-17 MED ORDER — SODIUM CHLORIDE 0.9 % IV SOLN
Freq: Once | INTRAVENOUS | Status: AC
  Administered 2015-12-17: via INTRAVENOUS

## 2015-12-17 MED ORDER — INSULIN ASPART 100 UNIT/ML ~~LOC~~ SOLN
0.0000 [IU] | SUBCUTANEOUS | Status: DC
  Administered 2015-12-18 (×3): 2 [IU] via SUBCUTANEOUS
  Administered 2015-12-18: 1 [IU] via SUBCUTANEOUS
  Administered 2015-12-19: 2 [IU] via SUBCUTANEOUS
  Administered 2015-12-19 (×3): 1 [IU] via SUBCUTANEOUS

## 2015-12-17 MED ORDER — MONTELUKAST SODIUM 10 MG PO TABS
10.0000 mg | ORAL_TABLET | Freq: Every day | ORAL | Status: DC
  Administered 2015-12-17 – 2015-12-22 (×5): 10 mg via ORAL
  Filled 2015-12-17 (×6): qty 1

## 2015-12-17 MED ORDER — ACETAMINOPHEN 500 MG PO TABS
500.0000 mg | ORAL_TABLET | Freq: Once | ORAL | Status: AC
  Administered 2015-12-17: 500 mg via ORAL
  Filled 2015-12-17: qty 1

## 2015-12-17 MED ORDER — QUETIAPINE FUMARATE 25 MG PO TABS
25.0000 mg | ORAL_TABLET | Freq: Three times a day (TID) | ORAL | Status: DC | PRN
  Administered 2015-12-19: 25 mg via ORAL
  Filled 2015-12-17: qty 1

## 2015-12-17 MED ORDER — KETOROLAC TROMETHAMINE 15 MG/ML IJ SOLN
15.0000 mg | Freq: Once | INTRAMUSCULAR | Status: AC
  Administered 2015-12-17: 15 mg via INTRAVENOUS
  Filled 2015-12-17: qty 1

## 2015-12-17 MED ORDER — QUETIAPINE FUMARATE 50 MG PO TABS
50.0000 mg | ORAL_TABLET | Freq: Two times a day (BID) | ORAL | Status: DC
  Administered 2015-12-17 – 2015-12-19 (×4): 50 mg via ORAL
  Filled 2015-12-17 (×4): qty 1

## 2015-12-17 MED ORDER — POTASSIUM CHLORIDE 10 MEQ/100ML IV SOLN
10.0000 meq | INTRAVENOUS | Status: AC
  Administered 2015-12-17 – 2015-12-18 (×5): 10 meq via INTRAVENOUS
  Filled 2015-12-17 (×4): qty 100

## 2015-12-17 MED ORDER — PRAVASTATIN SODIUM 20 MG PO TABS
20.0000 mg | ORAL_TABLET | Freq: Every day | ORAL | Status: DC
  Filled 2015-12-17: qty 1

## 2015-12-17 MED ORDER — SERTRALINE HCL 100 MG PO TABS
100.0000 mg | ORAL_TABLET | Freq: Every day | ORAL | Status: DC
  Administered 2015-12-18 – 2015-12-23 (×6): 100 mg via ORAL
  Filled 2015-12-17 (×6): qty 1

## 2015-12-17 MED ORDER — ONDANSETRON HCL 4 MG/2ML IJ SOLN
4.0000 mg | Freq: Once | INTRAMUSCULAR | Status: AC
  Administered 2015-12-17: 4 mg via INTRAVENOUS
  Filled 2015-12-17: qty 2

## 2015-12-17 MED ORDER — FAMOTIDINE IN NACL 20-0.9 MG/50ML-% IV SOLN
20.0000 mg | Freq: Two times a day (BID) | INTRAVENOUS | Status: DC
  Administered 2015-12-18 – 2015-12-19 (×4): 20 mg via INTRAVENOUS
  Filled 2015-12-17 (×5): qty 50

## 2015-12-17 MED ORDER — DONEPEZIL HCL 5 MG PO TABS
5.0000 mg | ORAL_TABLET | Freq: Every day | ORAL | Status: DC
  Administered 2015-12-17 – 2015-12-18 (×2): 5 mg via ORAL
  Filled 2015-12-17 (×3): qty 1

## 2015-12-17 MED ORDER — POTASSIUM CHLORIDE 10 MEQ/100ML IV SOLN
10.0000 meq | Freq: Once | INTRAVENOUS | Status: AC
  Administered 2015-12-17: 10 meq via INTRAVENOUS
  Filled 2015-12-17: qty 100

## 2015-12-17 MED ORDER — CLONIDINE HCL 0.1 MG PO TABS
0.1000 mg | ORAL_TABLET | Freq: Two times a day (BID) | ORAL | Status: DC
  Administered 2015-12-17 – 2015-12-19 (×4): 0.1 mg via ORAL
  Filled 2015-12-17 (×4): qty 1

## 2015-12-17 MED ORDER — ACETAMINOPHEN 325 MG PO TABS
650.0000 mg | ORAL_TABLET | ORAL | Status: DC | PRN
  Administered 2015-12-20: 650 mg via ORAL
  Filled 2015-12-17 (×2): qty 2

## 2015-12-17 MED ORDER — ACETAMINOPHEN 325 MG PO TABS
650.0000 mg | ORAL_TABLET | Freq: Once | ORAL | Status: AC
  Administered 2015-12-17: 650 mg via ORAL
  Filled 2015-12-17: qty 2

## 2015-12-17 MED ORDER — STROKE: EARLY STAGES OF RECOVERY BOOK
Freq: Once | Status: AC
  Administered 2015-12-18: 02:00:00

## 2015-12-17 MED ORDER — DOCUSATE SODIUM 100 MG PO CAPS
100.0000 mg | ORAL_CAPSULE | Freq: Two times a day (BID) | ORAL | Status: DC
  Administered 2015-12-17 – 2015-12-23 (×12): 100 mg via ORAL
  Filled 2015-12-17 (×12): qty 1

## 2015-12-17 MED ORDER — ACETAMINOPHEN 650 MG RE SUPP
650.0000 mg | RECTAL | Status: DC | PRN
  Administered 2015-12-18: 650 mg via RECTAL
  Filled 2015-12-17: qty 1

## 2015-12-17 NOTE — ED Notes (Signed)
Patient has returned from [facility] with Stroke team in tow

## 2015-12-17 NOTE — ED Notes (Signed)
Patient comes from [facility]. Patient was found having tonic clonic seizure per EMS states lasted 10 mins. Per EMS on their arrival patient was not speaking and not moving left side with left gaze. Per EMS patient at baseline able to walk and talk.

## 2015-12-17 NOTE — Code Documentation (Signed)
82 year old presents via [place] as code stroke.  EMS staff states nursing home staff reports seizure activity - EMS did not see seizure - they saw left side weakness - left gaze preference and mute.  She improved on way to ED - was alert on arrival to ED and moving left side - still with some left gaze preference.  Met at bridge by stroke team - to CT scan.  In CT scan she started c/o pain all over and some nausea.  NIHSS 03 - baseline dementia.  Dr. Shon Hale present - CT reviewed - code stroke cancelled - Dr. Shon Hale feels post ictal.   Handoff to [facility] RN.

## 2015-12-17 NOTE — ED Notes (Signed)
Patient placed on 4L o2 97%

## 2015-12-17 NOTE — ED Notes (Signed)
Pt c/o headache and "kidney pain." Pt also reports urinary hesitancy, bladder scan completed, in and out cath done

## 2015-12-17 NOTE — ED Notes (Signed)
Patient able to tell name but not birthday. Patient not oriented to time or place.

## 2015-12-17 NOTE — H&P (Addendum)
Jocelyn Burgess Lore [id number] DOB: 1932-03-22 DOA: 12/17/2015     PCP:  [facility]:psychiatry [place] Patient coming from:  From facility  [facility] [place] [facility]  Chief Complaint: Seizure activity  HPI: Jocelyn Burgess is a 82 y.o. female with medical history significant of pulmonary embolism diagnosed in the family 2017 18 [place] currently on Xarelto, dementia COPD on chronic 2 - 3L of oxygen, HTN, DM2, remote history of breast cancer, tonic hyponatremia recurrent hypokalemia    Presented with seizure event witnessed by other caretakers She resides at the nursing home facility. Staff described generalized tonic-clonic seizure. That lasted for 10 minutes. After the seizure patient seemed to be more lethargic than usual and had left gaze deviation and seemed to be unable to move her left arm and leg. They will concern about stroke and call 911 code stroke was activated 1 state arrived to emerge department her weakness in gaze deviation resolved and she was able to verbalize. At her baseline patient is somewhat confused. Patient does not endorse any fevers or chills no chest pain or shortness of breath no melena no bright red blood per rectum  Regarding pertinent Chronic problems: History of PE in the family 2017 diagnosed at outside facility currently on Xarelto in the past patient developed hemoptysis in the head to be held for a few days.   IN ER: Afebrile heart rate 82-1 66/80 WBC 9.1 hemoglobin was found to be 6.5 platelets 452 Potassium 2.5 bicarbonate 19 glucose 260 CT head nonacute Chest x-ray prominence in the right perihilar area worrisome for mass or adenopathy She was seen in consult by neurology who feels that this is most likely consistent Todd's paralysis but he at this point recommends admission for TIA workup if seizures resumed to start on phenytoin Hospitalist was called for admission for TIA versus Todd's paralysis  new-onset seizure activity, significant anemia Hemoccult-positive stool and hypokalemia  Review of Systems:    Pertinent positives include: Confusion gaze deviation and hand weakness  Constitutional:  No weight loss, night sweats, Fevers, chills, fatigue, weight loss  HEENT:  No headaches, Difficulty swallowing,Tooth/dental problems,Sore throat,  No sneezing, itching, ear ache, nasal congestion, post nasal drip,  Cardio-vascular:  No chest pain, Orthopnea, PND, anasarca, dizziness, palpitations.no Bilateral lower extremity swelling  GI:  No heartburn, indigestion, abdominal pain, nausea, vomiting, diarrhea, change in bowel habits, loss of appetite, melena, blood in stool, hematemesis Resp:  no shortness of breath at rest. No dyspnea on exertion, No excess mucus, no productive cough, No non-productive cough, No coughing up of blood.No change in color of mucus.No wheezing. Skin:  no rash or lesions. No jaundice GU:  no dysuria, change in color of urine, no urgency or frequency. No straining to urinate.  No flank pain.  Musculoskeletal:  No joint pain or no joint swelling. No decreased range of motion. No back pain.  Psych:  No change in mood or affect. No depression or anxiety. No memory loss.  Neuro: no localizing neurological complaints, no tingling, no weakness, no double vision, no gait abnormality, no slurred speech, no confusion  As per HPI otherwise 10 point review of systems negative.   Past Medical History: Past Medical History  Diagnosis Date  . Allergy   . Cancer ([place])   . Diabetes mellitus without complication ([place])   . Arthritis   . Asthma   . Cataract   . COPD (chronic obstructive pulmonary disease) ([place])   . Depression  sometimes  . Hyperlipidemia   . Hypertension   . Oxygen deficiency   . Major depressive disorder, recurrent, in full remission with anxious distress ([place]) 11/23/2014  . Vertebral fracture, osteoporotic ([place]) 08/07/2015  . Anxiety   .  Pulmonary embolism ([place])     Diagnosis 07/2015 in [place]. On Xarelto  . Seizure ([place]) 12/17/2015  . Symptomatic anemia 12/17/2015   Past Surgical History  Procedure Laterality Date  . Breast surgery Left      Social History:  Ambulatory  walker  Able to walk a short distance    reports that she has quit smoking. Her smoking use included Cigarettes. She has a 45 pack-year smoking history. She uses smokeless tobacco. She reports that she does not drink alcohol or use illicit drugs.  Allergies:   Allergies  Allergen Reactions  . Ciprofloxacin Other (See Comments)    Reaction:  Unknown   . Codeine Other (See Comments)    Reaction:  Unknown   . Inhaler Decongestant [Methamphetamine] Other (See Comments)    Reaction:  Unknown   . Molds & Smuts Other (See Comments)    Reaction:  Wheezing/itchy,watery eyes   . Other Other (See Comments)    Pt is allergic to strawberries per MAR   Reaction:  Unknown   . Albuterol Sulfate Palpitations and Other (See Comments)    Pt states that she is allergic to the [facility] brand only.         Family History:    Family History  Problem Relation Age of Onset  . Arthritis Mother   . Arthritis Father   . Cancer Sister     brain  . Heart disease Sister   . Stroke Sister   . Diabetes Brother     Medications: Prior to Admission medications   Medication Sig Start Date End Date Taking? Authorizing Provider  acetaminophen (TYLENOL) 500 MG tablet Take 500 mg by mouth 3 (three) times daily.   Yes Historical Provider, MD  albuterol (PROVENTIL HFA;VENTOLIN HFA) 108 (90 Base) MCG/ACT inhaler Inhale 1-2 puffs into the lungs every 4 (four) hours as needed for wheezing or shortness of breath. 08/15/15  Yes Bobetta Lime, MD  cloNIDine (CATAPRES) 0.1 MG tablet Take 0.1 mg by mouth 2 (two) times daily.   Yes Historical Provider, MD  docusate sodium (COLACE) 100 MG capsule Take 1 capsule (100 mg total) by mouth 2 (two) times daily. 07/26/15  Yes Demetrios Loll,  MD  donepezil (ARICEPT) 5 MG tablet Take 1 tablet (5 mg total) by mouth at bedtime. 08/08/15  Yes Bobetta Lime, MD  metFORMIN (GLUCOPHAGE) 1000 MG tablet Take 1 tablet (1,000 mg total) by mouth 2 (two) times daily with a meal. 07/03/15  Yes Bobetta Lime, MD  mometasone (ELOCON) 0.1 % cream Apply 1 application topically as needed (dry itching skin).    Yes Historical Provider, MD  montelukast (SINGULAIR) 10 MG tablet Take 10 mg by mouth at bedtime.   Yes Historical Provider, MD  nystatin (MYCOSTATIN) 100000 UNIT/ML suspension Take 5 mLs by mouth 4 (four) times daily. Retain in mouth as long as possible then swallow.   Yes Historical Provider, MD  pravastatin (PRAVACHOL) 20 MG tablet Take 20 mg by mouth daily.    Yes Historical Provider, MD  QUEtiapine (SEROQUEL) 25 MG tablet Take 25 mg by mouth every 8 (eight) hours as needed (agitation). In addition to 50 mg twice daily   Yes Historical Provider, MD  QUEtiapine (SEROQUEL) 50 MG tablet Take 50 mg  by mouth 2 (two) times daily. In addition to 25 mg every 8 hours as needed for agitation   Yes Historical Provider, MD  rivaroxaban (XARELTO) 20 MG TABS tablet Take 20 mg by mouth every evening. 5pm   Yes Historical Provider, MD  sertraline (ZOLOFT) 100 MG tablet Take 100 mg by mouth daily.   Yes Historical Provider, MD  tiotropium (SPIRIVA) 18 MCG inhalation capsule Place 18 mcg into inhaler and inhale at bedtime.    Yes Historical Provider, MD  furosemide (LASIX) 20 MG tablet Take 1 tablet (20 mg total) by mouth every morning. Patient not taking: Reported on 12/17/2015 07/30/15   Bobetta Lime, MD  magnesium oxide (MAG-OX) 400 (241.3 Mg) MG tablet Take 1 tablet (400 mg total) by mouth 2 (two) times daily. Patient not taking: Reported on 12/17/2015 08/23/15   Fritzi Mandes, MD  meloxicam (MOBIC) 15 MG tablet Take 1 tablet (15 mg total) by mouth daily. Patient not taking: Reported on 12/17/2015 05/01/15   Bobetta Lime, MD    Physical Exam: Patient Vitals  for the past 24 hrs:  BP Temp Pulse Resp SpO2  12/17/15 2047 - 97.9 F (36.6 C) - - -  12/17/15 2045 166/80 mmHg - 82 23 99 %  12/17/15 2030 159/79 mmHg - (!) 40 15 100 %  12/17/15 2000 165/78 mmHg - 87 19 100 %  12/17/15 1945 154/70 mmHg - 72 18 100 %  12/17/15 1845 179/74 mmHg - 75 23 97 %  12/17/15 1832 - - - - 94 %  12/17/15 1831 - - - - (!) 70 %  12/17/15 1830 161/79 mmHg - 84 18 (!) 73 %    1. General:  in No Acute distress 2. Psychological: Alert but not Oriented 3. Head/ENT:    Dry Mucous Membranes                          Head Non traumatic, neck supple                           Poor Dentition 4. SKIN:  decreased Skin turgor,  Skin clean Dry and intact no rash 5. Heart: Regular rate and rhythm mild Murmur, Rub or gallop 6. Lungs:  Clear to auscultation bilaterally, no wheezes or crackles   7. Abdomen: Soft, non-tender, Non distended 8. Lower extremities: no clubbing, cyanosis, or edema 9. Neurologically strength appears to equal bilaterally, not following directions 10. MSK: Normal range of motion Hemoccult positive  body mass index is unknown because there is no weight on file.  Labs on Admission:   Labs on Admission: I have personally reviewed following labs and imaging studies  CBC:  Recent Labs Lab 12/17/15 1811 12/17/15 1819  WBC 9.1  --   NEUTROABS 7.3  --   HGB 6.5* 8.5*  HCT 23.1* 25.0*  MCV 73.3*  --   PLT 452*  --    Basic Metabolic Panel:  Recent Labs Lab 12/17/15 1811 12/17/15 1819  NA 134* 137  K 2.5* 2.6*  CL 101 99*  CO2 19*  --   GLUCOSE 269* 265*  BUN 13 13  CREATININE 0.76 0.50  CALCIUM 9.1  --    GFR: CrCl cannot be calculated (Unknown ideal weight.). Liver Function Tests:  Recent Labs Lab 12/17/15 1811  AST 21  ALT 17  ALKPHOS 59  BILITOT 0.4  PROT 7.3  ALBUMIN 3.4*   No results  for input(s): LIPASE, AMYLASE in the last 168 hours. No results for input(s): AMMONIA in the last 168 hours. Coagulation  Profile:  Recent Labs Lab 12/17/15 1811  INR 2.70*   Cardiac Enzymes: No results for input(s): CKTOTAL, CKMB, CKMBINDEX, TROPONINI in the last 168 hours. BNP (last 3 results) No results for input(s): PROBNP in the last 8760 hours. HbA1C: No results for input(s): HGBA1C in the last 72 hours. CBG: No results for input(s): GLUCAP in the last 168 hours. Lipid Profile: No results for input(s): CHOL, HDL, LDLCALC, TRIG, CHOLHDL, LDLDIRECT in the last 72 hours. Thyroid Function Tests: No results for input(s): TSH, T4TOTAL, FREET4, T3FREE, THYROIDAB in the last 72 hours. Anemia Panel: No results for input(s): VITAMINB12, FOLATE, FERRITIN, TIBC, IRON, RETICCTPCT in the last 72 hours. Urine analysis:    Component Value Date/Time   COLORURINE YELLOW 12/17/2015 2029   APPEARANCEUR CLEAR 12/17/2015 2029   LABSPEC 1.013 12/17/2015 2029   PHURINE 7.0 12/17/2015 2029   GLUCOSEU 100* 12/17/2015 2029   HGBUR NEGATIVE 12/17/2015 2029   BILIRUBINUR NEGATIVE 12/17/2015 2029   BILIRUBINUR negative 04/17/2015 1045   KETONESUR 15* 12/17/2015 2029   PROTEINUR NEGATIVE 12/17/2015 2029   PROTEINUR trace 04/17/2015 1045   UROBILINOGEN 0.2 04/17/2015 1045   NITRITE NEGATIVE 12/17/2015 2029   NITRITE negative 04/17/2015 1045   LEUKOCYTESUR NEGATIVE 12/17/2015 2029   Sepsis Labs: @LABRCNTIP (procalcitonin:4,lacticidven:4) )No results found for this or any previous visit (from the past 240 hour(s)).    UA no evidence of UTI  Lab Results  Component Value Date   HGBA1C 7.5 07/03/2015    CrCl cannot be calculated (Unknown ideal weight.).  BNP (last 3 results) No results for input(s): PROBNP in the last 8760 hours.   ECG REPORT  Independently reviewed Rate: 87  Rhythm: Sinus rhythm  ST&T Change: No acute ischemic changes   QTC  446  There were no vitals filed for this visit.   Cultures:    Component Value Date/Time   SDES URINE, RANDOM 08/21/2015 2005   SPECREQUEST NONE 08/21/2015  2005   CULT 5,000 COLONIES/mL INSIGNIFICANT GROWTH 08/21/2015 2005   REPTSTATUS 08/23/2015 FINAL 08/21/2015 2005     Radiological Exams on Admission: Dg Chest Portable 1 View  12/17/2015  CLINICAL DATA:  Seizures.  Altered mental status EXAM: PORTABLE CHEST 1 VIEW COMPARISON:  Nov 12, 2015 FINDINGS: There is interstitial edema. There is mild cardiomegaly. There is mild pulmonary venous hypertension. There is extensive atherosclerotic calcification in the aorta. There is prominence in or overlying the right hilar region. Question pulmonary nodular lesion overlying the right hilum versus right hilar adenopathy. No bone lesions are evident. IMPRESSION: Prominence in the right perihilar region with concern for mass or adenopathy in this area. This opacity measures 2.9 x 2.9 cm. Advise noncontrast enhanced chest CT to assess for potential mass or adenopathy in the right perihilar region. Interstitial edema with mild cardiac prominence and pulmonary venous hypertension consistent with a degree of congestive heart failure. Aortic atherosclerosis noted. Electronically Signed   By: Lowella Grip III M.D.   On: 12/17/2015 19:15   Ct Head Code Stroke W/o Cm  12/17/2015  CLINICAL DATA:  Code stroke.  Left side gaze and weakness. EXAM: CT HEAD WITHOUT CONTRAST TECHNIQUE: Contiguous axial images were obtained from the base of the skull through the vertex without intravenous contrast. COMPARISON:  08/22/2015 FINDINGS: There is atrophy and chronic small vessel disease changes. No acute intracranial abnormality. Specifically, no hemorrhage, hydrocephalus, mass lesion, acute infarction,  or significant intracranial injury. No acute calvarial abnormality. Mastoid air cells are clear. Minimal opacity in the posterior left sphenoid sinus. Remainder the paranasal sinuses are clear. IMPRESSION: No acute intracranial abnormality. Atrophy, chronic microvascular disease. Critical Value/emergent results were called by telephone at  the time of interpretation on 12/17/2015 at 6:32 pm to Dr. Shon Hale , who verbally acknowledged these results. Electronically Signed   By: Rolm Baptise M.D.   On: 12/17/2015 18:35    Chart has been reviewed    Assessment/Plan  82 y.o. female with medical history significant of pulmonary embolism diagnosed in the family 2017 18 [place] currently on Xarelto, dementia COPD, HTN, DM2, remote history of breast cancer, tonic hyponatremia recurrent hypokalemi healing new onset seizure, followed by Sherren Mocha paralysis versus TIA, in a setting of anemia and Hemoccult positive stools and hypokalemia  Present on Admission:  New onset seizure appreciate neurology consult will order imaging MRI/MRA and EEG . 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 Order PT/OT evaluation. Will make sure patient is on antiplatelet agent.   Neurology consult.     . COPD, severe ([place]) continue home medications  . Dementia chronic expect some degree of sundowning  . Diabetes mellitus type 2, controlled, with complications ([place]) - hold home medications will order sliding scale . Hypokalemia replace and monitor on telemetry  . Symptomatic anemia obtain an anemia panel Hemoccult stool was positive. We'll need to discuss with GI. Transfuse 2 units Follow CBC post transfusion  . Occult blood positive stool will need to have evaluation by GI hold Xarelto for now  . Pulmonary nodule - once stable will obtain noncontrasted CT of the chest to evaluate for mass  Other plan as per orders.  DVT prophylaxis:  SCD      Code Status:  FULL CODE as per  family   Family Communication:   Family   at  Bedside  plan of care was discussed with  Husband, Solon Palm  Disposition Plan:                             Back to current facility  when stable                        Consults called: neurology , [facility] Dr. Jiles Prows will see in AM  Admission status:  inpatient        Level of care     tele          [place] 12/17/2015, 10:28 PM    [facility]  Pager [phone]   after 2 AM please page floor coverage PA If 7AM-7PM, please contact the day team taking care of the patient  [email or web address]  Password [email or web address]

## 2015-12-17 NOTE — Progress Notes (Signed)
Patient arrived to [facility]. Seizure precautions in place. Vitals taken. Will continue to monitor. Mateo Overbeck, Rande Brunt, RN

## 2015-12-17 NOTE — ED Notes (Signed)
Spoke to Provider regarding additional runs of potassium, no new orders at this time

## 2015-12-17 NOTE — ED Notes (Signed)
Assisted Janee', RN with placing patient on bedpan to urinate with no success; patient could not go; placed a diaper on patient and readjusted patient on stretcher

## 2015-12-17 NOTE — ED Notes (Signed)
MD made aware of patient Hbg and potassium

## 2015-12-17 NOTE — ED Provider Notes (Signed)
CSN: [id number]     Arrival date & time 12/17/15  1807 History   None    Chief Complaint  Patient presents with  . Code Stroke     (Consider location/radiation/quality/duration/timing/severity/associated sxs/prior Treatment) Patient is a 82 y.o. female presenting with Acute Neurological Problem. The history is provided by the EMS personnel.  Cerebrovascular Accident This is a new problem. The current episode started today. The problem occurs constantly. The problem has been gradually improving. Associated symptoms comments: Left sided weakness and left gaze preference. Last known normal 3:00 pm. Staff reports generalized shaking prior to left gaze preference and left sided weakness. Patient was . Nothing aggravates the symptoms. She has tried nothing for the symptoms.    Past Medical History  Diagnosis Date  . Allergy   . Cancer ([place])   . Diabetes mellitus without complication ([place])   . Arthritis   . Asthma   . Cataract   . COPD (chronic obstructive pulmonary disease) ([place])   . Depression     sometimes  . Hyperlipidemia   . Hypertension   . Oxygen deficiency   . Major depressive disorder, recurrent, in full remission with anxious distress ([place]) 11/23/2014  . Vertebral fracture, osteoporotic ([place]) 08/07/2015  . Anxiety   . Pulmonary embolism ([place])     Diagnosis 07/2015 in [place]. On Xarelto  . Seizure ([place]) 12/17/2015  . Symptomatic anemia 12/17/2015   Past Surgical History  Procedure Laterality Date  . Breast surgery Left    Family History  Problem Relation Age of Onset  . Arthritis Mother   . Arthritis Father   . Cancer Sister     brain  . Heart disease Sister   . Stroke Sister   . Diabetes Brother    Social History  Substance Use Topics  . Smoking status: Former Smoker -- 1.00 packs/day for 45 years    Types: Cigarettes  . Smokeless tobacco: Current User  . Alcohol Use: No   OB History    No data available     Review of Systems  Unable to perform ROS:  Mental status change      Allergies  Ciprofloxacin; Codeine; Inhaler decongestant; Molds & smuts; Other; and Albuterol sulfate  Home Medications   Prior to Admission medications   Medication Sig Start Date End Date Taking? Authorizing Provider  acetaminophen (TYLENOL) 500 MG tablet Take 500 mg by mouth 3 (three) times daily.   Yes Historical Provider, MD  albuterol (PROVENTIL HFA;VENTOLIN HFA) 108 (90 Base) MCG/ACT inhaler Inhale 1-2 puffs into the lungs every 4 (four) hours as needed for wheezing or shortness of breath. 08/15/15  Yes Bobetta Lime, MD  cloNIDine (CATAPRES) 0.1 MG tablet Take 0.1 mg by mouth 2 (two) times daily.   Yes Historical Provider, MD  docusate sodium (COLACE) 100 MG capsule Take 1 capsule (100 mg total) by mouth 2 (two) times daily. 07/26/15  Yes Demetrios Loll, MD  donepezil (ARICEPT) 5 MG tablet Take 1 tablet (5 mg total) by mouth at bedtime. 08/08/15  Yes Bobetta Lime, MD  metFORMIN (GLUCOPHAGE) 1000 MG tablet Take 1 tablet (1,000 mg total) by mouth 2 (two) times daily with a meal. 07/03/15  Yes Bobetta Lime, MD  mometasone (ELOCON) 0.1 % cream Apply 1 application topically as needed (dry itching skin).    Yes Historical Provider, MD  montelukast (SINGULAIR) 10 MG tablet Take 10 mg by mouth at bedtime.   Yes Historical Provider, MD  nystatin (MYCOSTATIN) 100000 UNIT/ML suspension Take 5 mLs  by mouth 4 (four) times daily. Retain in mouth as long as possible then swallow.   Yes Historical Provider, MD  pravastatin (PRAVACHOL) 20 MG tablet Take 20 mg by mouth daily.    Yes Historical Provider, MD  QUEtiapine (SEROQUEL) 25 MG tablet Take 25 mg by mouth every 8 (eight) hours as needed (agitation). In addition to 50 mg twice daily   Yes Historical Provider, MD  QUEtiapine (SEROQUEL) 50 MG tablet Take 50 mg by mouth 2 (two) times daily. In addition to 25 mg every 8 hours as needed for agitation   Yes Historical Provider, MD  rivaroxaban (XARELTO) 20 MG TABS tablet Take 20 mg  by mouth every evening. 5pm   Yes Historical Provider, MD  sertraline (ZOLOFT) 100 MG tablet Take 100 mg by mouth daily.   Yes Historical Provider, MD  tiotropium (SPIRIVA) 18 MCG inhalation capsule Place 18 mcg into inhaler and inhale at bedtime.    Yes Historical Provider, MD  furosemide (LASIX) 20 MG tablet Take 1 tablet (20 mg total) by mouth every morning. Patient not taking: Reported on 12/17/2015 07/30/15   Bobetta Lime, MD  magnesium oxide (MAG-OX) 400 (241.3 Mg) MG tablet Take 1 tablet (400 mg total) by mouth 2 (two) times daily. Patient not taking: Reported on 12/17/2015 08/23/15   Fritzi Mandes, MD  meloxicam (MOBIC) 15 MG tablet Take 1 tablet (15 mg total) by mouth daily. Patient not taking: Reported on 12/17/2015 05/01/15   Bobetta Lime, MD   BP 154/104 mmHg  Pulse 73  Temp(Src) 98 F (36.7 C) (Oral)  Resp 16  Ht 5\' 7"  (1.702 m)  Wt 66.1 kg  BMI 22.82 kg/m2  SpO2 95% Physical Exam  Constitutional: She appears well-developed and well-nourished. No distress.  HENT:  Head: Normocephalic and atraumatic.  Eyes: Conjunctivae are normal.  Cardiovascular: Normal rate and normal heart sounds.   No murmur heard. Pulmonary/Chest: Effort normal and breath sounds normal.  Abdominal: Soft. There is no tenderness.  Neurological: She is alert. No cranial nerve deficit.  Moving bilateral upper and lower extremities sluggishly.   Skin: Skin is warm. She is not diaphoretic.  Psychiatric: She has a normal mood and affect. Her behavior is normal.  Nursing note and vitals reviewed.   ED Course  Procedures (including critical care time) Labs Review Labs Reviewed  PROTIME-INR - Abnormal; Notable for the following:    Prothrombin Time 28.3 (*)    INR 2.70 (*)    All other components within normal limits  CBC - Abnormal; Notable for the following:    RBC 3.15 (*)    Hemoglobin 6.5 (*)    HCT 23.1 (*)    MCV 73.3 (*)    MCH 20.6 (*)    MCHC 28.1 (*)    RDW 18.0 (*)    Platelets 452 (*)     All other components within normal limits  COMPREHENSIVE METABOLIC PANEL - Abnormal; Notable for the following:    Sodium 134 (*)    Potassium 2.5 (*)    CO2 19 (*)    Glucose, Bld 269 (*)    Albumin 3.4 (*)    All other components within normal limits  URINALYSIS, ROUTINE W REFLEX MICROSCOPIC (NOT AT [facility]) - Abnormal; Notable for the following:    Glucose, UA 100 (*)    Ketones, ur 15 (*)    All other components within normal limits  IRON AND TIBC - Abnormal; Notable for the following:    Iron 10 (*)  Saturation Ratios 3 (*)    All other components within normal limits  RETICULOCYTES - Abnormal; Notable for the following:    RBC. 2.96 (*)    All other components within normal limits  MAGNESIUM - Abnormal; Notable for the following:    Magnesium 1.3 (*)    All other components within normal limits  TROPONIN I - Abnormal; Notable for the following:    Troponin I 0.08 (*)    All other components within normal limits  TROPONIN I - Abnormal; Notable for the following:    Troponin I 0.06 (*)    All other components within normal limits  GLUCOSE, CAPILLARY - Abnormal; Notable for the following:    Glucose-Capillary 174 (*)    All other components within normal limits  HEMOGLOBIN AND HEMATOCRIT, BLOOD - Abnormal; Notable for the following:    Hemoglobin 7.3 (*)    HCT 24.8 (*)    All other components within normal limits  GLUCOSE, CAPILLARY - Abnormal; Notable for the following:    Glucose-Capillary 129 (*)    All other components within normal limits  BRAIN NATRIURETIC PEPTIDE - Abnormal; Notable for the following:    B Natriuretic Peptide 614.4 (*)    All other components within normal limits  GLUCOSE, CAPILLARY - Abnormal; Notable for the following:    Glucose-Capillary 135 (*)    All other components within normal limits  TROPONIN I - Abnormal; Notable for the following:    Troponin I 0.06 (*)    All other components within normal limits  CBC - Abnormal; Notable for  the following:    RBC 3.49 (*)    Hemoglobin 7.8 (*)    HCT 26.2 (*)    MCV 75.1 (*)    MCH 22.3 (*)    MCHC 29.8 (*)    RDW 18.2 (*)    Platelets 455 (*)    All other components within normal limits  COMPREHENSIVE METABOLIC PANEL - Abnormal; Notable for the following:    Potassium 3.4 (*)    Glucose, Bld 140 (*)    Calcium 8.8 (*)    Albumin 3.4 (*)    All other components within normal limits  I-STAT CHEM 8, ED - Abnormal; Notable for the following:    Potassium 2.6 (*)    Chloride 99 (*)    Glucose, Bld 265 (*)    Hemoglobin 8.5 (*)    HCT 25.0 (*)    All other components within normal limits  I-STAT ARTERIAL BLOOD GAS, ED - Abnormal; Notable for the following:    pO2, Arterial 104.0 (*)    Bicarbonate 26.0 (*)    All other components within normal limits  POC OCCULT BLOOD, ED - Abnormal; Notable for the following:    Fecal Occult Bld POSITIVE (*)    All other components within normal limits  CULTURE, BLOOD (ROUTINE X 2)  CULTURE, BLOOD (ROUTINE X 2)  MRSA PCR SCREENING  ETHANOL  APTT  DIFFERENTIAL  URINE RAPID DRUG SCREEN, HOSP PERFORMED  VITAMIN B12  FOLATE  FERRITIN  PHOSPHORUS  LIPID PANEL  PROCALCITONIN  LIPASE, BLOOD  LACTIC ACID, PLASMA  HEMOGLOBIN A1C  URINALYSIS, DIPSTICK ONLY  TROPONIN I  TROPONIN I  HEMOGLOBIN AND HEMATOCRIT, BLOOD  I-STAT TROPOININ, ED  I-STAT TROPOININ, ED  CBG MONITORING, ED  I-STAT CHEM 8, ED  POC OCCULT BLOOD, ED  TYPE AND SCREEN  PREPARE RBC (CROSSMATCH)  ABO/RH  PREPARE RBC (CROSSMATCH)  PREPARE RBC (CROSSMATCH)    Imaging  Review Dg Chest 2 View  12/18/2015  CLINICAL DATA:  Patient presents with mid upper back pain and shortness of breath. Also history of seizure. EXAM: CHEST  2 VIEW COMPARISON:  12/17/2015. FINDINGS: Cardiomegaly. Severe COPD. Concerning again raised for RIGHT hilar mass. RIGHT pleural effusion is noted. Possible RIGHT basilar airspace disease. Diffuse pulmonary edema has developed since priors.  Consider CT chest for further evaluation. Severe osteopenia. The thoracic vertebrae are insufficiently well-visualized to assess for interval stability of compression deformities. IMPRESSION: Persistent RIGHT hilar prominence. Adenopathy or mass is not excluded. Worsening aeration from priors, with regard to RIGHT basilar opacity, RIGHT effusion, and diffuse edema. Electronically Signed   By: Staci Righter M.D.   On: 12/18/2015 08:45   Mr Brain Wo Contrast  12/18/2015  CLINICAL DATA:  Patient with dementia, and history of seizures, presents with a seizure today. EXAM: MRI HEAD WITHOUT CONTRAST TECHNIQUE: Multiplanar, multiecho pulse sequences of the brain and surrounding structures were obtained without intravenous contrast. COMPARISON:  12/17/2015. FINDINGS: The patient was uncooperative, moving and yelling in the scanner. Only 4 sequences were obtained. Sagittal T1 weighted images are nondiagnostic. Axial T2 weighted images demonstrate atrophy with chronic microvascular ischemic change. Axial diffusion-weighted imaging demonstrates an acute infarct of the LEFT posterior frontal cortex, 5 mm in diameter. No definite hemorrhage. Compared with recent CT, the infarct is not visible. IMPRESSION: Prematurely truncated scan due to lack of patient cooperation. Acute LEFT posterior frontal cortical infarct, 5 mm in size. This appears nonhemorrhagic. Atrophy and small vessel disease. Electronically Signed   By: Staci Righter M.D.   On: 12/18/2015 07:46   Dg Chest Portable 1 View  12/17/2015  CLINICAL DATA:  Seizures.  Altered mental status EXAM: PORTABLE CHEST 1 VIEW COMPARISON:  Nov 12, 2015 FINDINGS: There is interstitial edema. There is mild cardiomegaly. There is mild pulmonary venous hypertension. There is extensive atherosclerotic calcification in the aorta. There is prominence in or overlying the right hilar region. Question pulmonary nodular lesion overlying the right hilum versus right hilar adenopathy. No bone  lesions are evident. IMPRESSION: Prominence in the right perihilar region with concern for mass or adenopathy in this area. This opacity measures 2.9 x 2.9 cm. Advise noncontrast enhanced chest CT to assess for potential mass or adenopathy in the right perihilar region. Interstitial edema with mild cardiac prominence and pulmonary venous hypertension consistent with a degree of congestive heart failure. Aortic atherosclerosis noted. Electronically Signed   By: Lowella Grip III M.D.   On: 12/17/2015 19:15   Ct Head Code Stroke W/o Cm  12/17/2015  CLINICAL DATA:  Code stroke.  Left side gaze and weakness. EXAM: CT HEAD WITHOUT CONTRAST TECHNIQUE: Contiguous axial images were obtained from the base of the skull through the vertex without intravenous contrast. COMPARISON:  08/22/2015 FINDINGS: There is atrophy and chronic small vessel disease changes. No acute intracranial abnormality. Specifically, no hemorrhage, hydrocephalus, mass lesion, acute infarction, or significant intracranial injury. No acute calvarial abnormality. Mastoid air cells are clear. Minimal opacity in the posterior left sphenoid sinus. Remainder the paranasal sinuses are clear. IMPRESSION: No acute intracranial abnormality. Atrophy, chronic microvascular disease. Critical Value/emergent results were called by telephone at the time of interpretation on 12/17/2015 at 6:32 pm to Dr. Shon Hale , who verbally acknowledged these results. Electronically Signed   By: Rolm Baptise M.D.   On: 12/17/2015 18:35   I have personally reviewed and evaluated these images and lab results as part of my medical decision-making.   EKG  Interpretation   Date/Time:  Monday December 17 2015 18:28:21 EDT Ventricular Rate:  87 PR Interval:    QRS Duration: 94 QT Interval:  370 QTC Calculation: 446 R Axis:   47 Text Interpretation:  Sinus rhythm Ventricular premature complex Consider  left atrial enlargement LVH with secondary repolarization abnormality Poor   data quality in current ECG precludes serial comparison otherwise no  significant change Confirmed by KNAPP  MD-J, JON [id number]) on 12/17/2015  7:44:12 PM      MDM   Final diagnoses:  TIA (transient ischemic attack)  Seizure ([place])  Occult blood positive stool  Abdominal pain  Lung nodule  GIB (gastrointestinal bleeding)   Danella M Wogan presents for seizure like activity with post-ictal left gaze preference and not moving left arm or speaking. Symptoms improved prior to arrival by EMS. Code stroke was initially called but was de-escalated when patient's symptoms were improved. She was talkative but confused and moving all extremities on my exam. Complaining of headache and nausea.   Hgb 6.5 with heme-positive stool. Given 1 unit transfusion. EKG shows sinus rhythm with no evidence of ischemia. Troponin mildly elevated at 0.06. K 2.6. Given 20 mEq of K in ED.   CT head shows no evidence of intracranial abnormality. CXR shows a perihilar mass with right pleural effusion and pleural edema.   Patient admitted to hospitalist for further work up and care.   Seen with Dr. Tomi Bamberger.     Allie Bossier, MD 12/18/15 1152  Dorie Rank, MD 12/19/15 1024

## 2015-12-17 NOTE — Consult Note (Signed)
Neurology Consultation Reason for Consult: CODE STROKE Referring Physician: Tomi Bamberger  CC: seizure, left gaze deviation, left sided weakness  History is obtained from EMS who transported the patient to the ED. Limited records from her nursing facility were reviewed along with her EPIC medical record.   HPI: Jocelyn Burgess is a 82 y.o. female who was at her SNF this afternoon when she apparently had a witnessed seizure. According to EMS, facility staff described GTC seizure activity that allegedly lasted for ten minutes. Following the seizure she was lethargic and poorly responsive and was noted to have a left gaze deviation with limited movement of the left arm and leg. They called 911. EMS reports that on their arrival, her symptoms persisted. CODE STROKE was activated. As they were pulling into the ambulance bay at the ED, they report that her weakness and gaze deviation resolved and she was able to answer some of their questions. In the ED she is alert but confused, making it difficult for her to provide any information. No family is present in the ED at the time of this encounter.   Of note, she has a history of PE for which she takes Xarelto. According to the [facility] from her SNF, she received a dose of Xarelto this afternoon at 1700. She has a recent history of hemoptysis in May 2017.    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 In addition, she received Xarelto today.  Premorbid modified rankin scale: Not clear based on available info but perhaps 2-3     ROS: A 14 point ROS was performed and is limited by the patient's encephalopathy. She repeatedly complains of pain in both arms but little else.    Past Medical History  Diagnosis Date  . Allergy   . Cancer ([place])   . Diabetes mellitus without complication ([place])   . Arthritis   . Asthma   . Cataract   . COPD (chronic obstructive pulmonary  disease) ([place])   . Depression     sometimes  . Hyperlipidemia   . Hypertension   . Oxygen deficiency   . Major depressive disorder, recurrent, in full remission with anxious distress ([place]) 11/23/2014  . Vertebral fracture, osteoporotic ([place]) 08/07/2015  . Anxiety   . Pulmonary embolism ([place])     Diagnosis 07/2015 in [place]. On Xarelto    Family History  Problem Relation Age of Onset  . Arthritis Mother   . Arthritis Father   . Cancer Sister     brain  . Heart disease Sister   . Stroke Sister   . Diabetes Brother     Social History: Per chart review. The patient is married. She currently lives in a SNF. She is a former smoker with a 45-pk year history. There is no reported alcohol or illicit drug use.    Exam: Current vital signs: BP 179/74 mmHg  Pulse 75  Resp 23  SpO2 97% Vital signs in last 24 hours: Pulse Rate:  [75-84] 75 (07/03 1845) Resp:  [18-23] 23 (07/03 1845) BP: (161-179)/(74-79) 179/74 mmHg (07/03 1845) SpO2:  [70 %-97 %] 97 % (07/03 1845)   Physical Exam  Constitutional: This is a well-developed well-nourished elderly Caucasian woman. She is alert and will attend to stimuli from both sides, though her attention fluctuates. She is able to follow simple and midline appendicular commands but sometimes requires repetitive instructions. She is not clearly aphasic. She  has mild dysarthria but is edentulous.   HEENT: Neck is supple. Sclerae are anicteric. MM appear moist. OP appears clear.  Cardiovascular: Normal rate and regular rhythm.  Respiratory: Effort normal and breath sounds normal to anterior ascultation GI: Soft.  No distension. There is no tenderness.    Neuro: Mental Status: Patient is awake and alert, oriented to self only. No signs of aphasia or neglect. Cranial Nerves: II: Visual Fields are full to threat. Pupils are equal, round, and reactive to light.  III,IV, VI: EOMI without ptosis or diploplia. No gaze deviation observed. V: Corneals  are intact bilaterally. VII: Facial movement is symmetric.  VIII: hearing is intact to voice X: Unable to assess due to poor attention XI: Unable to assess due to poor attention XII: tongue appears midline  Motor: Tone is normal with mitgehen paratonia. Bulk is normal for age. Strength appears normal in all four extremities but effort was variable at times. No abnormal movements were observed.  Sensory: Sensation is limited by attention but appears intact to light touch Deep Tendon Reflexes: Brisk 2+ and symmetric  Plantars: Toes are downgoing bilaterally. Cerebellar: FNF appears to be affected by poor vision. She has no overt dysmetria when reaching for objects spontaneously.    I have reviewed labs in epic and the results pertinent to this consultation are: K+ 2.6 Cl 99 Glucose 265 Hgb 8.5 Hct 25.0   I have personally reviewed the [facility] without contrast from this visit. This shows a moderate burden of chronic small vessel ischemic disease with no acute pathology. She has moderate diffuse atrophy.   Impression: 1. Seizure: Based upon reported history, this is a first-time event but this will need to be confirmed with family if possible. The description is of a GTC seizure with no mention of focal onset.  A left gaze deviation with subsequent left hemiparesis would suggest a possible left frontal lobe focus for her seizure. This pattern would not be typical of a stroke. Recommend checking EEG in AM. Recommend MRI brain to fully exclude structure CNS pathology as cause if patient is able to tolerate. Seizure precautions. Hold on AED for now and observe. Should seizures recur, I would recommend initiating phenytoin.   2. 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 Follow.   3. Cerebrovascular disease: CTH shows chronic small vessel ischemic disease. Known cerebrovascular risk factors include HTN, h/o smoking, age, and DM. Ensure tight control of risk  factors. She is on Xarelto for PE so would not recommend antiplatelet therapy at this time. If she stops the Xarelto this can be revisited.   4. Dementia: This is a diagnosis listed in her records but I have no specific information regarding type or severity at this time. Will follow.     Melba Coon, MD [facility] [phone] 6:51 PM

## 2015-12-17 NOTE — ED Notes (Signed)
Patient undressed, in gown, on monitor, continuous pulse oximetry and blood pressure cuff 

## 2015-12-17 NOTE — ED Notes (Signed)
Provider at bedside speaking to husband regarding blood transfusion. Husband to sign consent, pt with hx of dementia

## 2015-12-17 NOTE — ED Notes (Signed)
Admitting MD at bedside.

## 2015-12-17 NOTE — ED Notes (Signed)
CBG resulted 265 per lab work at [employer] [facility]

## 2015-12-18 ENCOUNTER — Inpatient Hospital Stay (HOSPITAL_COMMUNITY): Payer: Medicare Other

## 2015-12-18 LAB — LIPID PANEL
CHOLESTEROL: 103 mg/dL (ref 0–200)
HDL: 44 mg/dL (ref >40)
LDL CALC: 37 mg/dL (ref 0–99)
Total CHOL/HDL Ratio: 2.3 RATIO
Triglycerides: 111 mg/dL (ref <150)
VLDL: 22 mg/dL (ref 0–40)

## 2015-12-18 LAB — COMPREHENSIVE METABOLIC PANEL
ALT: 15 U/L (ref 14–54)
ANION GAP: 8 (ref 5–15)
AST: 20 U/L (ref 15–41)
Albumin: 3.4 g/dL — ABNORMAL LOW (ref 3.5–5.0)
Alkaline Phosphatase: 51 U/L (ref 38–126)
BILIRUBIN TOTAL: 0.8 mg/dL (ref 0.3–1.2)
BUN: 12 mg/dL (ref 6–20)
CO2: 25 mmol/L (ref 22–32)
Calcium: 8.8 mg/dL — ABNORMAL LOW (ref 8.9–10.3)
Chloride: 102 mmol/L (ref 101–111)
Creatinine, Ser: 0.58 mg/dL (ref 0.44–1.00)
Glucose, Bld: 140 mg/dL — ABNORMAL HIGH (ref 65–99)
POTASSIUM: 3.4 mmol/L — AB (ref 3.5–5.1)
Sodium: 135 mmol/L (ref 135–145)
TOTAL PROTEIN: 6.8 g/dL (ref 6.5–8.1)

## 2015-12-18 LAB — CBC
HEMATOCRIT: 26.2 % — AB (ref 36.0–46.0)
Hemoglobin: 7.8 g/dL — ABNORMAL LOW (ref 12.0–15.0)
MCH: 22.3 pg — ABNORMAL LOW (ref 26.0–34.0)
MCHC: 29.8 g/dL — AB (ref 30.0–36.0)
MCV: 75.1 fL — AB (ref 78.0–100.0)
Platelets: 455 10*3/uL — ABNORMAL HIGH (ref 150–400)
RBC: 3.49 MIL/uL — ABNORMAL LOW (ref 3.87–5.11)
RDW: 18.2 % — AB (ref 11.5–15.5)
WBC: 7.5 10*3/uL (ref 4.0–10.5)

## 2015-12-18 LAB — TROPONIN I
Troponin I: 0.04 ng/mL (ref <0.03)
Troponin I: 0.06 ng/mL (ref <0.03)
Troponin I: 0.06 ng/mL (ref <0.03)
Troponin I: 0.08 ng/mL (ref <0.03)

## 2015-12-18 LAB — GLUCOSE, CAPILLARY
GLUCOSE-CAPILLARY: 129 mg/dL — AB (ref 65–99)
GLUCOSE-CAPILLARY: 159 mg/dL — AB (ref 65–99)
GLUCOSE-CAPILLARY: 109 mg/dL — AB (ref 65–99)
GLUCOSE-CAPILLARY: 181 mg/dL — AB (ref 65–99)
GLUCOSE-CAPILLARY: 160 mg/dL — AB (ref 65–99)
Glucose-Capillary: 135 mg/dL — ABNORMAL HIGH (ref 65–99)

## 2015-12-18 LAB — HEMOGLOBIN AND HEMATOCRIT, BLOOD
HCT: 24.8 % — ABNORMAL LOW (ref 36.0–46.0)
HCT: 29.7 % — ABNORMAL LOW (ref 36.0–46.0)
HEMOGLOBIN: 9.1 g/dL — AB (ref 12.0–15.0)
Hemoglobin: 7.3 g/dL — ABNORMAL LOW (ref 12.0–15.0)

## 2015-12-18 LAB — BRAIN NATRIURETIC PEPTIDE: B Natriuretic Peptide: 614.4 pg/mL — ABNORMAL HIGH (ref 0.0–100.0)

## 2015-12-18 LAB — LACTIC ACID, PLASMA: Lactic Acid, Venous: 0.9 mmol/L (ref 0.5–1.9)

## 2015-12-18 LAB — PHENYTOIN LEVEL, TOTAL: Phenytoin Lvl: 15 ug/mL (ref 10.0–20.0)

## 2015-12-18 LAB — MRSA PCR SCREENING: MRSA BY PCR: NEGATIVE

## 2015-12-18 LAB — PREPARE RBC (CROSSMATCH)

## 2015-12-18 LAB — LIPASE, BLOOD: Lipase: 18 U/L (ref 11–51)

## 2015-12-18 LAB — PROCALCITONIN: Procalcitonin: <0.1 ng/mL

## 2015-12-18 MED ORDER — MAGNESIUM SULFATE 2 GM/50ML IV SOLN
2.0000 g | Freq: Once | INTRAVENOUS | Status: AC
  Administered 2015-12-18: 2 g via INTRAVENOUS
  Filled 2015-12-18: qty 50

## 2015-12-18 MED ORDER — DEXTROSE 5 % IV SOLN
10.0000 mg | Freq: Once | INTRAVENOUS | Status: DC
  Filled 2015-12-18: qty 1

## 2015-12-18 MED ORDER — IOPAMIDOL (ISOVUE-300) INJECTION 61%
100.0000 mL | Freq: Once | INTRAVENOUS | Status: AC | PRN
  Administered 2015-12-18: 100 mL via INTRAVENOUS

## 2015-12-18 MED ORDER — DIATRIZOATE MEGLUMINE & SODIUM 66-10 % PO SOLN
ORAL | Status: AC
  Filled 2015-12-18: qty 30

## 2015-12-18 MED ORDER — MORPHINE SULFATE (PF) 2 MG/ML IV SOLN
1.0000 mg | INTRAVENOUS | Status: DC | PRN
  Administered 2015-12-18 (×2): 1 mg via INTRAVENOUS
  Filled 2015-12-18 (×3): qty 1

## 2015-12-18 MED ORDER — FUROSEMIDE 10 MG/ML IJ SOLN
INTRAMUSCULAR | Status: AC
  Filled 2015-12-18: qty 4

## 2015-12-18 MED ORDER — LEVALBUTEROL HCL 1.25 MG/0.5ML IN NEBU
1.2500 mg | INHALATION_SOLUTION | Freq: Four times a day (QID) | RESPIRATORY_TRACT | Status: DC
  Administered 2015-12-18 – 2015-12-19 (×4): 1.25 mg via RESPIRATORY_TRACT
  Filled 2015-12-18 (×6): qty 0.5

## 2015-12-18 MED ORDER — PANTOPRAZOLE SODIUM 40 MG PO TBEC
40.0000 mg | DELAYED_RELEASE_TABLET | Freq: Two times a day (BID) | ORAL | Status: DC
  Administered 2015-12-18 – 2015-12-23 (×10): 40 mg via ORAL
  Filled 2015-12-18 (×11): qty 1

## 2015-12-18 MED ORDER — LORAZEPAM 2 MG/ML IJ SOLN
1.0000 mg | INTRAMUSCULAR | Status: DC | PRN
  Administered 2015-12-21: 1 mg via INTRAVENOUS
  Filled 2015-12-18: qty 1

## 2015-12-18 MED ORDER — FUROSEMIDE 10 MG/ML IJ SOLN
INTRAMUSCULAR | Status: AC
  Administered 2015-12-18: 40 mg
  Filled 2015-12-18: qty 4

## 2015-12-18 MED ORDER — FERUMOXYTOL INJECTION 510 MG/17 ML
510.0000 mg | Freq: Once | INTRAVENOUS | Status: AC
  Administered 2015-12-18: 510 mg via INTRAVENOUS
  Filled 2015-12-18 (×2): qty 17

## 2015-12-18 MED ORDER — POTASSIUM CHLORIDE 10 MEQ/100ML IV SOLN
INTRAVENOUS | Status: AC
  Filled 2015-12-18: qty 100

## 2015-12-18 MED ORDER — POTASSIUM CHLORIDE 10 MEQ/100ML IV SOLN
10.0000 meq | INTRAVENOUS | Status: AC
  Administered 2015-12-18 (×4): 10 meq via INTRAVENOUS
  Filled 2015-12-18: qty 100

## 2015-12-18 MED ORDER — MAGNESIUM SULFATE 2 GM/50ML IV SOLN
2.0000 g | Freq: Once | INTRAVENOUS | Status: AC
  Administered 2015-12-18: 2 g via INTRAVENOUS
  Filled 2015-12-18 (×2): qty 50

## 2015-12-18 MED ORDER — SODIUM CHLORIDE 0.9 % IV SOLN
Freq: Once | INTRAVENOUS | Status: AC
  Administered 2015-12-18: 17:00:00 via INTRAVENOUS

## 2015-12-18 MED ORDER — FUROSEMIDE 10 MG/ML IJ SOLN
40.0000 mg | Freq: Once | INTRAMUSCULAR | Status: AC
  Administered 2015-12-18: 40 mg via INTRAVENOUS
  Filled 2015-12-18: qty 4

## 2015-12-18 MED ORDER — IOPAMIDOL (ISOVUE-300) INJECTION 61%
INTRAVENOUS | Status: AC
  Filled 2015-12-18: qty 100

## 2015-12-18 MED ORDER — ATORVASTATIN CALCIUM 40 MG PO TABS
40.0000 mg | ORAL_TABLET | Freq: Every day | ORAL | Status: DC
  Administered 2015-12-18 – 2015-12-22 (×5): 40 mg via ORAL
  Filled 2015-12-18 (×6): qty 1

## 2015-12-18 MED ORDER — SODIUM CHLORIDE 0.9 % IV BOLUS (SEPSIS)
500.0000 mL | Freq: Once | INTRAVENOUS | Status: AC
  Administered 2015-12-18: 500 mL via INTRAVENOUS

## 2015-12-18 MED ORDER — SODIUM CHLORIDE 0.9 % IV SOLN
1000.0000 mg | Freq: Once | INTRAVENOUS | Status: AC
  Administered 2015-12-18: 1000 mg via INTRAVENOUS
  Filled 2015-12-18: qty 20

## 2015-12-18 MED ORDER — IPRATROPIUM BROMIDE 0.02 % IN SOLN
0.5000 mg | Freq: Four times a day (QID) | RESPIRATORY_TRACT | Status: DC
  Administered 2015-12-18 – 2015-12-19 (×4): 0.5 mg via RESPIRATORY_TRACT
  Filled 2015-12-18 (×4): qty 2.5

## 2015-12-18 MED ORDER — FUROSEMIDE 40 MG PO TABS: 40.0000 mg | ORAL_TABLET | Freq: Once | ORAL | Status: AC

## 2015-12-18 MED ORDER — SODIUM CHLORIDE 0.9 % IV SOLN
3.0000 g | Freq: Three times a day (TID) | INTRAVENOUS | Status: DC
  Administered 2015-12-18 – 2015-12-19 (×4): 3 g via INTRAVENOUS
  Filled 2015-12-18 (×10): qty 3

## 2015-12-18 NOTE — Progress Notes (Signed)
During first blood transfusion pt with temp 99.0 axillary and HR 123. MD paged. Transfusion paused and rapid called for a second look. Pt with dementia so its difficult to get answers about pain or reactions. Waiting call back. Monitoring closely in the room. Jocelyn Burgess, Jocelyn Brunt, RN

## 2015-12-18 NOTE — Progress Notes (Signed)
EEG delayed due to stat CT per request of Dr Sheran Fava and transfer to 3S. Will check later for patient availability.

## 2015-12-18 NOTE — Progress Notes (Signed)
Patient with rhythmic jerking of left extremities, at times involves entire body. Patient is alert and able to communicate throughout episode. Per Neurology, only to give anti-seizure meds if patient unresponsive during episodes. Will cont to monitor.

## 2015-12-18 NOTE — Progress Notes (Signed)
SLP Cancellation Note  Patient Details Name: Jocelyn Burgess MRN: [id number] DOB: March 16, 1932   Cancelled treatment:       Reason Eval/Treat Not Completed: Other (comment): Pt passed RN stroke swallow screen. RN reported that pt is NPO for a GI consult. Will sign off on bedside swallow eval order, please re-order if needed. Appears that pt may need a speech-language evaluation given MRI results. Please order this if needed as well.   Kern Reap, [place], CCC-SLP 12/18/2015, 8:44 AM [phone]

## 2015-12-18 NOTE — Progress Notes (Signed)
Neurology Progress Note  Subjective: The patient is without any particular complaint at this time. She has no recollection of yesterday's events. Her husband is at the bedside and reports that her dementia is significant and she often has difficulty remembering things she did hours before. He is not aware of any prior history of seizures. She has not had any further seizure activity since the one that occurred at her nursing facility yesterday.   Objective: Temp:  [97.9 F (36.6 C)-99.9 F (37.7 C)] 98.6 F (37 C) (07/04 0800) Pulse Rate:  [40-123] 75 (07/04 0800) Resp:  [15-27] 16 (07/04 0607) BP: (120-188)/(55-107) 180/74 mmHg (07/04 0800) SpO2:  [70 %-100 %] 100 % (07/04 0800)   Gen: WD elderly Caucasian woman lying in bed in NAD. She is alert and answers questions appropriately. She is oriented to self only. She is able to recognize her husband. She follows midline and appendicular commands. She has mildly slurred speech but is edentulous. No aphasia.  HEENT: Neck supple without LAD. MM dry. She is edentulous. Sclerae are anicteric. No conjunctival injection.  CV: heart is regular, no murmur.  Lungs: CTAB on anterior exam.  Neuro: CN: Eyes are conjugate. EOMI with breakup of smooth pursuits, no nystagmus. She seems to have difficulty locating my finger when asked to follow it but can move her eyes in all cardinal directions to command. She reports normal facial sensation. Her face is symmetric at rest with normal strength and mobility. Hearing is intact to conversational voice. Palate elevates symmetrically, uvula is midline. Bilateral SCM and trapezii 5/5. Tongue is midline with normal bulk and mobility.  Motor: She has normal tone with mitgehen paratonia. She is slow to follow commands on the left, suggesting a possible element of mild neglect on that side. With encouragement, her strength is 5/5 in BUE. She gives variable effort with strength testing in the legs and c/o some pain which  limits the exam. No obvious lateralizing weakness is appreciated. She has a mild resting tremor of the right hand. No other abnormal movements are noted.  Sens: she reports intact light touch throughout.  Coord: She has difficulty with FTN as she seems to be unable to visually locate my finger. HTS is limited by c/o pain in her legs.  DTRs: 2+ and symmetric with the exception of absent ankle jerks bilaterally. Toes mute. Gait: Deferred at this time.   Labs reviewed: B12 349 Folate 12.4 Chol 103, trig 111, HDL 44, LDL 37   Current facility-administered medications:  .  0.9 %  sodium chloride infusion, , Intravenous, Once, Janece Canterbury, MD .  0.9 %  sodium chloride infusion, , Intravenous, Once, Janece Canterbury, MD .  acetaminophen (TYLENOL) tablet 650 mg, 650 mg, Oral, Q4H PRN **OR** acetaminophen (TYLENOL) suppository 650 mg, 650 mg, Rectal, Q4H PRN, Toy Baker, MD, 650 mg at 12/18/15 0840 .  Ampicillin-Sulbactam (UNASYN) 3 g in sodium chloride 0.9 % 100 mL IVPB, 3 g, Intravenous, Q8H, Norva Riffle, RPH .  atorvastatin (LIPITOR) tablet 40 mg, 40 mg, Oral, q1800, Janece Canterbury, MD .  cloNIDine (CATAPRES) tablet 0.1 mg, 0.1 mg, Oral, BID, Toy Baker, MD, 0.1 mg at 12/17/15 2346 .  diatrizoate meglumine-sodium (GASTROGRAFIN) 66-10 % solution, , , ,  .  docusate sodium (COLACE) capsule 100 mg, 100 mg, Oral, BID, Toy Baker, MD, 100 mg at 12/17/15 2345 .  donepezil (ARICEPT) tablet 5 mg, 5 mg, Oral, QHS, Toy Baker, MD, 5 mg at 12/17/15 2346 .  famotidine (PEPCID)  IVPB 20 mg premix, 20 mg, Intravenous, Q12H, Toy Baker, MD, Stopped at 12/17/15 2330 .  ferumoxytol (FERAHEME) 510 mg in sodium chloride 0.9 % 100 mL IVPB, 510 mg, Intravenous, Once, Janece Canterbury, MD .  furosemide (LASIX) 10 MG/ML injection, , , ,  .  furosemide (LASIX) tablet 40 mg, 40 mg, Oral, Once, Janece Canterbury, MD .  insulin aspart (novoLOG) injection 0-9 Units, 0-9 Units,  Subcutaneous, Q4H, Toy Baker, MD, 1 Units at 12/18/15 0458 .  iopamidol (ISOVUE-300) 61 % injection, , , ,  .  ipratropium (ATROVENT) nebulizer solution 0.5 mg, 0.5 mg, Nebulization, Q6H, Janece Canterbury, MD .  levalbuterol (XOPENEX) nebulizer solution 1.25 mg, 1.25 mg, Nebulization, Q6H, Janece Canterbury, MD, 1.25 mg at 12/18/15 0931 .  magnesium sulfate IVPB 2 g 50 mL, 2 g, Intravenous, Once, Janece Canterbury, MD .  montelukast (SINGULAIR) tablet 10 mg, 10 mg, Oral, QHS, Toy Baker, MD, 10 mg at 12/17/15 2346 .  morphine 2 MG/ML injection 1 mg, 1 mg, Intravenous, Q2H PRN, Janece Canterbury, MD .  pantoprazole (PROTONIX) EC tablet 40 mg, 40 mg, Oral, BID AC, Janece Canterbury, MD .  potassium chloride 10 mEq in 100 mL IVPB, 10 mEq, Intravenous, Q1 Hr x 4, Janece Canterbury, MD .  potassium chloride 10 MEQ/100ML IVPB, , , ,  .  QUEtiapine (SEROQUEL) tablet 25 mg, 25 mg, Oral, Q8H PRN, Toy Baker, MD .  QUEtiapine (SEROQUEL) tablet 50 mg, 50 mg, Oral, BID, Toy Baker, MD, 50 mg at 12/17/15 2345 .  sertraline (ZOLOFT) tablet 100 mg, 100 mg, Oral, Daily, Toy Baker, MD   Imaging: I have personally reviewed the MRI of the brain from today. This study is limited to DWI and axial T2 sequences as it had to be stopped due to poor patient cooperation. This shows a small area of restricted diffusion in the posterior left frontal region consistent with an acute ischemic infarction. There is a severe burden of T2 hyperintensity involving the bihemispheric white matter in a confluent fashion, consistent with severe chronic small vessel disease.   EEG pending  A/P:  1. Acute ischemic stroke: This is a punctate stroke involving the left frontal cortex.This location would not explain the seizure/Todd's paresis with semiology as described (i.e. Left gaze deviation and left hemiparesis). With this appearance, it is most likely an embolic stroke. Recommend TTE and carotid Dopplers  to complete evaluation. MRA has been ordered but she is not likely to tolerate this study without sedation, which may be counterproductive given her baseline dementia. She was on Xarelto at her nursing facility but this is currently being held and she is receiving blood transfusions for anemia. If this is not resumed, recommend initiation of aspirin for secondary prevention. LDL at goal, continue statin. Hemoglobin a1c pending, ensure appropriate glucose control.   2. Seizure: History as reported is consistent with a GTC seizure originating from the RIGHT frontal lobe. While an acute stroke can certainly cause seizure activity, the location of her stroke does not match reported seizure semiology or her post-ictal deficits of left hemiparesis/neglect. She has significant chronic ischemic cerebrovascular disease as well, making this the most likely etiology. Dementia also increases her risk of seizure. At this time, given that this is a first-time event without clear provocation at this time, I would hold on AED therapy unless EEG shows an definite epileptic focus. If treatment is needed, gabapentin would be a good first-line agent in this case.   3. Left hemineglect: On exam, she seems  to have left motor neglect. Strength is actually good when she focuses on her left side. This is not explained by her acute stroke. PT/OT.   4. Dementia: From her husband's report, this is severe at baseline. This is likely vascular dementia, though I cannot fully exclude a mixed process with alzheimer's and vascular components given her prominent short-term memory loss. Continue donepezil. Quetiapine would be the preferred agent for agitation should this become an issue.   This was all discussed with the patient at the bedside. He is in agreement with the plan as stated. He was given the opportunity to ask any questions and these were addressed to his satisfaction.

## 2015-12-18 NOTE — Progress Notes (Signed)
SLP Cancellation Note  Patient Details Name: Jocelyn Burgess MRN: [id number] DOB: 1931-11-04   Cancelled treatment:       Reason Eval/Treat Not Completed: Patient at procedure or test/unavailable. Speech language eval attempted; will reattempt as time allows.   Kern Reap, [place], CCC-SLP 12/18/2015, 10:31 AM [phone]

## 2015-12-18 NOTE — Progress Notes (Signed)
Patient ID: Jocelyn Burgess, female   DOB: [birth year], 82 y.o.   MRN: [id number]  Patient Hgb recheck 8.5 PRIOR to blood transfusion. Recent events noted and pt currently undergoing an EEG. No evidence of an active GI bleed. No indication for an urgent endoscopic evaluation and with her seizure management/evaluation I do not think there is any role for GI evaluation at this time. Please call us back prior to discharge and we can readdress whether to pursue any GI evaluation unless active GI bleeding develops and then call us back sooner.

## 2015-12-18 NOTE — Progress Notes (Addendum)
Magnesium level 1.3 in the ED and patient with tremors throughout the night. H&H was 8.5 before blood transfusion started. 4/5 potassium running at this time. MD paged about low magnesium and holding start of second unit of blood until page returned. Pt with low grade fever still at this time. Monitoring closely. Jocelyn Burgess, Rande Brunt, RN   Orders given to d/c second blood transfusion and get an H&H as well as run 2g of magnesium. Results showing Hgb 7.3 this am.  MD notified. Patient resting comfortably with minimal tremors this morning. Husband at bedside. Jocelyn Burgess, Rande Brunt, RN

## 2015-12-18 NOTE — Progress Notes (Signed)
transferred to [place] via bed per orders. Report given to RN.

## 2015-12-18 NOTE — Progress Notes (Signed)
Troponin 0.08 called in from the lab. MD paged [phone]. Transfusing first unit of blood at this time. Potassium running in separate iv site. Monitoring closely. Allye Hoyos, Rande Brunt, RN

## 2015-12-18 NOTE — Progress Notes (Addendum)
RN called for a second opinion on possible reaction to blood transfusion. RN advised to hold blood and page provider. Seen at bedside, responsive to voice, following simple commands. Hot skin w/rigors, rectal temp obtained 99.9, Spo2 90-96 on 4L, productive cough, HR 123. provider updated per floor RN, fluids ordered and blood restarted, rn to monitor. Advised to f/u with provider and RN with any new symptoms.

## 2015-12-18 NOTE — Progress Notes (Signed)
PT Cancellation Note  Patient Details Name: Jocelyn Burgess MRN: [id number] DOB: [birth year]   Cancelled Treatment:    Reason Eval/Treat Not Completed: Medical issues which prohibited therapy Patient not medically ready (transferred to Step down ICU) Spoke with RN and recommend holding today and checking back at a more appropriate time.   [name] 12/18/2015, 12:00 PM Wray Kearns, [place], DPT [phone]

## 2015-12-18 NOTE — Progress Notes (Signed)
Neurology Progress Note  Patient's EEG reviewed. See full report. This showed a single seizure originating in the right temporal lobe with secondary generalization manifest clinically by versive left head turn and unresponsiveness. I recommend initiation of AED. Gabapentin would be a good choice for longterm management in this patient given its favorable side effect profile and absence of drug-drug interactions. This will be started at 100 mg daily and increased by 100 mg daily each week to start. Initial target dose for seizures in the demented elderly is generally [phone] mg daily. Since it will take time to get her to a good dose of gabapentin, I will cover her with phenytoin in the meantime. Given her recurrent seizures, I will load with 1000 mg IV x1 then check a level to guide further dosing. Typical dose is 300 mg qhs.

## 2015-12-18 NOTE — Progress Notes (Signed)
Dear Doctor: Short This patient has been identified as a candidate for PICC for the following reason (s): IV therapy over 48 hours and poor veins/poor circulatory system (CHF, COPD, emphysema, diabetes, steroid use, IV drug abuse, etc.) If you agree, please write an order for the indicated device. For any questions contact the Vascular Access Team at [phone] if no answer, please leave a message.  Thank you for supporting the early vascular access assessment program. 

## 2015-12-18 NOTE — Progress Notes (Signed)
PROGRESS NOTE  Jocelyn Burgess  [id number] DOB: August 23, 1931 DOA: 12/17/2015 PCP: Bobetta Lime, MD   Brief Narrative:   Jocelyn Burgess is a 82 y.o. female with medical history significant of pulmonary embolism diagnosed in 2017 on Xarelto, dementia, COPD on chronic 2 - 3L of oxygen, HTN, DM2, remote history of breast cancer, chronic hyponatremia and recurrent hypokalemia. She currently resides at skilled nursing facility. She presented after she had a generalized tonic-clonic seizure that lasted for proximally 10 minutes at her facility. After seizure she was more lethargic than usual and had a left gaze deviation. She was unable to move her left arm or leg.  Recently, she had had some problems with hemoptysis and her Xarelto had been discontinued.  In the emergency department, her vital signs were stable however she had several lab and imaging at [facility]. She was anemic with a hemoglobin of 6.5. Her potassium was 2.5. Her CT of the head demonstrated no acute process however her chest x-ray was concerning for a prominence in the right perihilar area that was concerning for a mass. The hospitalist was called for admission. She was occult positive in the emergency department.  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 Clinically it was associated with a left head turn and unresponsiveness. -  Appreciate neurology assistance -  Seizure precautions -  Being loaded with phenytoin but anticipate transitioning to gabapentin -  MRI did not  demonstrate any mass or AVM or explanation for why she is having new onset seizures. Although she had an acute stroke it is not an area that would be a common cause for seizure  Acute left posterior frontal cortical infarct, 5 mm -  Telemetry: Normal sinus rhythm with occasional SVT -  Recently had echocardiogram in May of this year -  Carotid duplex -  Unlikely she would be able to tolerate MRA -  Follow-up hemoglobin A1c -  LDL at goal, continue statin  Acute blood loss anemia superimposed on iron deficiency anemia with occult positive stool -  Transfuse 1 unit PRBC on 7/4 -  Feraheme 1 -  Appreciate GI assistance  Possible lung mass and she has some firmness in epigastric area concerning for abdominal mass -  CT of the chest abdomen and pelvis -  Lipase and LFTs  Acute on chronic respiratory failure secondary to aspiration pneumonia -  Continue on nasal cannula -  Blood cultures -  Unasyn started on 7/4, last dose on 7/10  Severe COPD,  -  Continue Singulair -  Ipratropium and Xopenex  Pulmonary embolism -  Holding Xarelto secondary to recent hemoptysis and GI bleed  Dementia, stable, continue Aricept -  Continue Seroquel  Depression, stable, continue Zoloft  Diabetes mellitus type 2 -  Continue low-dose sliding scale insulin -  Hold metformin  Hypokalemia, continue IV supplementation The magnesium anemia, additional 2 g of IV magnesium sulfate today  Suspected diastolic dysfunction -  Lasix 40mg  IV once  Essential hypertension with elevated BP -  Continue clonidine  SVT, replete electrolytes   DVT prophylaxis:  SCDs Code Status:  DO NOT RESUSCITATE Family Communication:  Patient and her husband Disposition Plan:  Transition to stepdown, likely not home for several more days   Consultants:   Gastroenterology  Neurology  Procedures:  EEG on 7/4  Antimicrobials:   Unasyn 7/4   Subjective: Patient is confused limited by dementia.  Attempt at  blood transfusion was stopped overnight due to fevers and rigors and concern for transfusion reaction.  Complaining of chest pain but when she shows me where her pain is located it is in epigastric area and not in the chest. She denies dyspnea but is coughing. No vomiting.  Objective: Filed Vitals:   12/18/15 1357 12/18/15 1429 12/18/15 1501 12/18/15 1707  BP:  140/79 125/97 142/108  Pulse:  74 75 75  Temp:  99.1 F (37.3 C) 98.7 F (37.1 C) 98.8 F (37.1 C)  TempSrc:  Oral Oral Oral  Resp:  19 19 15   Height:      Weight:      SpO2: 95% 93% 94% 94%    Intake/Output Summary (Last 24 hours) at 12/18/15 1744 Last data filed at 12/18/15 1727  Gross per 24 hour  Intake   1620 ml  Output    300 ml  Net   1320 ml   Filed Weights   12/18/15 1100  Weight: 66.1 kg (145 lb 11.6 oz)    Examination:  General exam:  Adult Female.  Pale, somewhat sleepy.  HEENT:  NCAT, MMM Respiratory system:  Diminished bilateral breath sounds with rales at the left base, no rhonchi, faint wheeze Cardiovascular system: Regular rate and rhythm, normal S1/S2.  Warm extremities Gastrointestinal system: Normal active bowel sounds, soft, nondistended, tender to palpation in epigastric area without rebound.  This area feels like either a mass or some localized guarding.   MSK:  Normal tone and bulk, no lower extremity edema Neuro:  Cranial nerves II through XII grossly intact. Strength 5 out of 5 in the right upper and lower extremities, 5 minus out of 5 left upper and lower extremities. Obvious dysmetria of the left upper and lower extremities.    Data Reviewed: I have personally reviewed following labs and imaging studies  CBC:  Recent Labs Lab 12/17/15 1811 12/17/15 1819 12/18/15 0501 12/18/15 0917  WBC 9.1  --   --  7.5  NEUTROABS 7.3  --   --   --   HGB 6.5* 8.5* 7.3* 7.8*  HCT 23.1* 25.0* 24.8* 26.2*  MCV 73.3*  --   --  75.1*  PLT 452*  --   --  [id number]*   Basic Metabolic Panel:  Recent  Labs Lab 12/17/15 1811 12/17/15 1819 12/17/15 2220 12/18/15 0917  NA 134* 137  --  135  K 2.5* 2.6*  --  3.4*  CL 101 99*  --  102  CO2 19*  --   --  25  GLUCOSE 269* 265*  --  140*  BUN 13 13  --  12  CREATININE 0.76 0.50  --  0.58  CALCIUM 9.1  --   --  8.8*  MG  --   --  1.3*  --   PHOS  --   --  3.0  --    GFR: Estimated Creatinine Clearance: 53.6 mL/min (by C-G formula based on Cr of 0.58). Liver Function Tests:  Recent Labs Lab 12/17/15 1811 12/18/15 0917  AST 21 20  ALT 17 15  ALKPHOS  59 51  BILITOT 0.4 0.8  PROT 7.3 6.8  ALBUMIN 3.4* 3.4*    Recent Labs Lab 12/18/15 0917  LIPASE 18   No results for input(s): AMMONIA in the last 168 hours. Coagulation Profile:  Recent Labs Lab 12/17/15 1811  INR 2.70*   Cardiac Enzymes:  Recent Labs Lab 12/17/15 2332 12/18/15 0501 12/18/15 0917  TROPONINI 0.08* 0.06* 0.06*   BNP (last 3 results) No results for input(s): PROBNP in the last 8760 hours. HbA1C: No results for input(s): HGBA1C in the last 72 hours. CBG:  Recent Labs Lab 12/17/15 2355 12/18/15 0452 12/18/15 0805 12/18/15 1230 12/18/15 1526  GLUCAP 174* 129* 135* 159* 109*   Lipid Profile:  Recent Labs  12/18/15 0501  CHOL 103  HDL 44  LDLCALC 37  TRIG 111  CHOLHDL 2.3   Thyroid Function Tests: No results for input(s): TSH, T4TOTAL, FREET4, T3FREE, THYROIDAB in the last 72 hours. Anemia Panel:  Recent Labs  12/17/15 2220  VITAMINB12 349  FOLATE 12.4  FERRITIN 17  TIBC 328  IRON 10*  RETICCTPCT 2.5   Urine analysis:    Component Value Date/Time   COLORURINE YELLOW 12/17/2015 2029   APPEARANCEUR CLEAR 12/17/2015 2029   LABSPEC 1.013 12/17/2015 2029   PHURINE 7.0 12/17/2015 2029   GLUCOSEU 100* 12/17/2015 2029   HGBUR NEGATIVE 12/17/2015 2029   BILIRUBINUR NEGATIVE 12/17/2015 2029   BILIRUBINUR negative 04/17/2015 1045   KETONESUR 15* 12/17/2015 2029   PROTEINUR NEGATIVE 12/17/2015 2029   PROTEINUR trace  04/17/2015 1045   UROBILINOGEN 0.2 04/17/2015 1045   NITRITE NEGATIVE 12/17/2015 2029   NITRITE negative 04/17/2015 1045   LEUKOCYTESUR NEGATIVE 12/17/2015 2029   Sepsis Labs: @LABRCNTIP (procalcitonin:4,lacticidven:4)  ) Recent Results (from the past 240 hour(s))  MRSA PCR Screening     Status: None   Collection Time: 12/18/15 11:07 AM  Result Value Ref Range Status   MRSA by PCR NEGATIVE NEGATIVE Final    Comment:        The GeneXpert MRSA Assay (FDA approved for NASAL specimens only), is one component of a comprehensive MRSA colonization surveillance program. It is not intended to diagnose MRSA infection nor to guide or monitor treatment for MRSA infections.       Radiology Studies: Dg Chest 2 View  12/18/2015  CLINICAL DATA:  Patient presents with mid upper back pain and shortness of breath. Also history of seizure. EXAM: CHEST  2 VIEW COMPARISON:  12/17/2015. FINDINGS: Cardiomegaly. Severe COPD. Concerning again raised for RIGHT hilar mass. RIGHT pleural effusion is noted. Possible RIGHT basilar airspace disease. Diffuse pulmonary edema has developed since priors. Consider CT chest for further evaluation. Severe osteopenia. The thoracic vertebrae are insufficiently well-visualized to assess for interval stability of compression deformities. IMPRESSION: Persistent RIGHT hilar prominence. Adenopathy or mass is not excluded. Worsening aeration from priors, with regard to RIGHT basilar opacity, RIGHT effusion, and diffuse edema. Electronically Signed   By: Staci Righter M.D.   On: 12/18/2015 08:45   Mr Brain Wo Contrast  12/18/2015  CLINICAL DATA:  Patient with dementia, and history of seizures, presents with a seizure today. EXAM: MRI HEAD WITHOUT CONTRAST TECHNIQUE: Multiplanar, multiecho pulse sequences of the brain and surrounding structures were obtained without intravenous contrast. COMPARISON:  12/17/2015. FINDINGS: The patient was uncooperative, moving and yelling in the  scanner. Only 4 sequences were obtained. Sagittal T1 weighted images are nondiagnostic. Axial T2 weighted images demonstrate atrophy with chronic microvascular ischemic change. Axial diffusion-weighted imaging demonstrates an acute infarct  of the LEFT posterior frontal cortex, 5 mm in diameter. No definite hemorrhage. Compared with recent CT, the infarct is not visible. IMPRESSION: Prematurely truncated scan due to lack of patient cooperation. Acute LEFT posterior frontal cortical infarct, 5 mm in size. This appears nonhemorrhagic. Atrophy and small vessel disease. Electronically Signed   By: Staci Righter M.D.   On: 12/18/2015 07:46   Dg Chest Portable 1 View  12/17/2015  CLINICAL DATA:  Seizures.  Altered mental status EXAM: PORTABLE CHEST 1 VIEW COMPARISON:  Nov 12, 2015 FINDINGS: There is interstitial edema. There is mild cardiomegaly. There is mild pulmonary venous hypertension. There is extensive atherosclerotic calcification in the aorta. There is prominence in or overlying the right hilar region. Question pulmonary nodular lesion overlying the right hilum versus right hilar adenopathy. No bone lesions are evident. IMPRESSION: Prominence in the right perihilar region with concern for mass or adenopathy in this area. This opacity measures 2.9 x 2.9 cm. Advise noncontrast enhanced chest CT to assess for potential mass or adenopathy in the right perihilar region. Interstitial edema with mild cardiac prominence and pulmonary venous hypertension consistent with a degree of congestive heart failure. Aortic atherosclerosis noted. Electronically Signed   By: Lowella Grip III M.D.   On: 12/17/2015 19:15   Ct Head Code Stroke W/o Cm  12/17/2015  CLINICAL DATA:  Code stroke.  Left side gaze and weakness. EXAM: CT HEAD WITHOUT CONTRAST TECHNIQUE: Contiguous axial images were obtained from the base of the skull through the vertex without intravenous contrast. COMPARISON:  08/22/2015 FINDINGS: There is atrophy and  chronic small vessel disease changes. No acute intracranial abnormality. Specifically, no hemorrhage, hydrocephalus, mass lesion, acute infarction, or significant intracranial injury. No acute calvarial abnormality. Mastoid air cells are clear. Minimal opacity in the posterior left sphenoid sinus. Remainder the paranasal sinuses are clear. IMPRESSION: No acute intracranial abnormality. Atrophy, chronic microvascular disease. Critical Value/emergent results were called by telephone at the time of interpretation on 12/17/2015 at 6:32 pm to Dr. Shon Hale , who verbally acknowledged these results. Electronically Signed   By: Rolm Baptise M.D.   On: 12/17/2015 18:35     Scheduled Meds: . ampicillin-sulbactam (UNASYN) IV  3 g Intravenous Q8H  . atorvastatin  40 mg Oral q1800  . cloNIDine  0.1 mg Oral BID  . diatrizoate meglumine-sodium      . docusate sodium  100 mg Oral BID  . donepezil  5 mg Oral QHS  . famotidine (PEPCID) IV  20 mg Intravenous Q12H  . insulin aspart  0-9 Units Subcutaneous Q4H  . iopamidol      . ipratropium  0.5 mg Nebulization Q6H  . levalbuterol  1.25 mg Nebulization Q6H  . montelukast  10 mg Oral QHS  . pantoprazole  40 mg Oral BID AC  . phenytoin (DILANTIN) IV  1,000 mg Intravenous Once  . QUEtiapine  50 mg Oral BID  . sertraline  100 mg Oral Daily   Continuous Infusions:    LOS: 1 day    Time spent: 30 min    Janece Canterbury, MD [facility] Pager [phone]  If 7PM-7AM, please contact night-coverage [email or web address] Password [facility] 12/18/2015, 5:44 PM

## 2015-12-18 NOTE — Progress Notes (Signed)
Bedside EEG completed, results pending. 

## 2015-12-18 NOTE — Progress Notes (Signed)
Electroencephalogram (EEG) Report  Date of study: 12/18/15  Requesting clinician: Short MD  Reason for study: Seizure evaluation  Brief clinical history: 56-yo woman with h/o dementia brought in from SNF after a witnessed GTC seizure.    Medications:  Current facility-administered medications:  .  0.9 %  sodium chloride infusion, , Intravenous, Once, Janece Canterbury, MD .  0.9 %  sodium chloride infusion, , Intravenous, Once, Janece Canterbury, MD .  acetaminophen (TYLENOL) tablet 650 mg, 650 mg, Oral, Q4H PRN **OR** acetaminophen (TYLENOL) suppository 650 mg, 650 mg, Rectal, Q4H PRN, Toy Baker, MD, 650 mg at 12/18/15 0840 .  Ampicillin-Sulbactam (UNASYN) 3 g in sodium chloride 0.9 % 100 mL IVPB, 3 g, Intravenous, Q8H, Norva Riffle, RPH, 3 g at 12/18/15 1318 .  atorvastatin (LIPITOR) tablet 40 mg, 40 mg, Oral, q1800, Janece Canterbury, MD .  cloNIDine (CATAPRES) tablet 0.1 mg, 0.1 mg, Oral, BID, Toy Baker, MD, 0.1 mg at 12/18/15 1017 .  diatrizoate meglumine-sodium (GASTROGRAFIN) 66-10 % solution, , , ,  .  docusate sodium (COLACE) capsule 100 mg, 100 mg, Oral, BID, Toy Baker, MD, 100 mg at 12/18/15 1019 .  donepezil (ARICEPT) tablet 5 mg, 5 mg, Oral, QHS, Toy Baker, MD, 5 mg at 12/17/15 2346 .  famotidine (PEPCID) IVPB 20 mg premix, 20 mg, Intravenous, Q12H, Toy Baker, MD, 20 mg at 12/18/15 1019 .  ferumoxytol (FERAHEME) 510 mg in sodium chloride 0.9 % 100 mL IVPB, 510 mg, Intravenous, Once, Janece Canterbury, MD .  insulin aspart (novoLOG) injection 0-9 Units, 0-9 Units, Subcutaneous, Q4H, Toy Baker, MD, 2 Units at 12/18/15 1237 .  iopamidol (ISOVUE-300) 61 % injection, , , ,  .  ipratropium (ATROVENT) nebulizer solution 0.5 mg, 0.5 mg, Nebulization, Q6H, Janece Canterbury, MD .  levalbuterol (XOPENEX) nebulizer solution 1.25 mg, 1.25 mg, Nebulization, Q6H, Janece Canterbury, MD, 1.25 mg at 12/18/15 0931 .  montelukast (SINGULAIR) tablet  10 mg, 10 mg, Oral, QHS, Toy Baker, MD, 10 mg at 12/17/15 2346 .  morphine 2 MG/ML injection 1 mg, 1 mg, Intravenous, Q2H PRN, Janece Canterbury, MD .  pantoprazole (PROTONIX) EC tablet 40 mg, 40 mg, Oral, BID AC, Janece Canterbury, MD, 40 mg at 12/18/15 1133 .  potassium chloride 10 mEq in 100 mL IVPB, 10 mEq, Intravenous, Q1 Hr x 4, Janece Canterbury, MD, 10 mEq at 12/18/15 1238 .  potassium chloride 10 MEQ/100ML IVPB, , , ,  .  QUEtiapine (SEROQUEL) tablet 25 mg, 25 mg, Oral, Q8H PRN, Toy Baker, MD .  QUEtiapine (SEROQUEL) tablet 50 mg, 50 mg, Oral, BID, Toy Baker, MD, 50 mg at 12/18/15 1017 .  sertraline (ZOLOFT) tablet 100 mg, 100 mg, Oral, Daily, Toy Baker, MD, 100 mg at 12/18/15 1017  Description: This is a routine EEG performed using standard international 10-20 electrode placement. A total of 18 channels are recorded, including one for the EKG.  Activating Maneuvers: None performed due to patient's mental status.  Findings:  The background consists of mildly disorganized generalized theta activity with some intermixed delta. The best dominant posterior rhythm is about 6-7 Hz. This is symmetric and reacts as expected with eye opening. Occasional frontal intermittent rhythmic delta activity (FIRDA) is noted. Voltages at times appear to be lower in the right hemisphere when compared to the left. Her background is reactive to external stimulation.  She had two episodes of left arm and leg movements during the study. These are best described as low amplitude pronation-supination of the left arm and medium  amplitude extension of the left knee. These are asynchronous with the leg and arm movements occurring at different frequencies. No evident seizure activity is seen on the EEG with these movements.   At elapsed time 19:10, the patient in observed to turn her head up and to the left. She is unresponsive. With this, the EEG demonstrates increased amplitude rhythmic  delta activity that initially seems to start in the right temporal region but which generalizes. This lasts for about 40 seconds before degenerating and stopping over the next 10 seconds.    Impression: This is an abnormal EEG due to mild diffuse generalized slowing of the background. This is consistent with the patient's known history of dementia.   She had a single electrographic seizure that appears to start in the right temporal region with rapid generalization. This lasted about 50 seconds in total and was associated clinically with versive left head turn and unresponsiveness.    Melba Coon, MD [facility]

## 2015-12-18 NOTE — Progress Notes (Signed)
Pharmacy Antibiotic Note  Jocelyn Burgess is a 82 y.o. female admitted on 12/17/2015 with new seizure.  Pharmacy has been consulted for Unasyn dosing for aspiration pneumonia.  Plan: Unasyn 3g IV q8h Monitor renal function Follow available micro data     Temp (24hrs), Avg:98.7 F (37.1 C), Min:97.9 F (36.6 C), Max:99.9 F (37.7 C)   Recent Labs Lab 12/17/15 1811 12/17/15 1819  WBC 9.1  --   CREATININE 0.76 0.50    CrCl cannot be calculated (Unknown ideal weight.).    Allergies  Allergen Reactions  . Ciprofloxacin Other (See Comments)    Reaction:  Unknown   . Codeine Other (See Comments)    Reaction:  Unknown   . Inhaler Decongestant [Methamphetamine] Other (See Comments)    Reaction:  Unknown   . Molds & Smuts Other (See Comments)    Reaction:  Wheezing/itchy,watery eyes   . Other Other (See Comments)    Pt is allergic to strawberries per MAR   Reaction:  Unknown   . Albuterol Sulfate Palpitations and Other (See Comments)    Pt states that she is allergic to the [facility] brand only.      Antimicrobials this admission: Unasyn 7/4>>  Dose adjustments this admission:   Microbiology results:   Thank you for allowing pharmacy to be a part of this patient's care.  Legrand Como, Pharm.D., BCPS, AAHIVP Clinical Pharmacist Phone: [phone] or [phone] 12/18/2015, 9:49 AM

## 2015-12-18 NOTE — Progress Notes (Signed)
OT Cancellation Note  Patient Details Name: Jocelyn Burgess MRN: [id number] DOB: 12/02/1931   Cancelled Treatment:    Reason Eval/Treat Not Completed: Patient not medically ready (transferred to Step down ICU) Spoke with RN and recommend holding today and checking back at a more appropriate time.   Vonita Moss   OTR/L Pager: [phone] Office: [phone] .  12/18/2015, 11:17 AM

## 2015-12-19 ENCOUNTER — Encounter (HOSPITAL_COMMUNITY): Payer: Medicare Other

## 2015-12-19 ENCOUNTER — Inpatient Hospital Stay (HOSPITAL_COMMUNITY): Payer: Medicare Other

## 2015-12-19 ENCOUNTER — Other Ambulatory Visit (HOSPITAL_COMMUNITY): Payer: Medicare Other

## 2015-12-19 DIAGNOSIS — I639 Cerebral infarction, unspecified: Secondary | ICD-10-CM | POA: Diagnosis present

## 2015-12-19 DIAGNOSIS — I2699 Other pulmonary embolism without acute cor pulmonale: Secondary | ICD-10-CM | POA: Insufficient documentation

## 2015-12-19 DIAGNOSIS — J189 Pneumonia, unspecified organism: Secondary | ICD-10-CM

## 2015-12-19 DIAGNOSIS — I63312 Cerebral infarction due to thrombosis of left middle cerebral artery: Secondary | ICD-10-CM

## 2015-12-19 DIAGNOSIS — J9621 Acute and chronic respiratory failure with hypoxia: Secondary | ICD-10-CM | POA: Diagnosis present

## 2015-12-19 DIAGNOSIS — I6789 Other cerebrovascular disease: Secondary | ICD-10-CM

## 2015-12-19 LAB — ECHOCARDIOGRAM LIMITED
CHL CUP RV SYS PRESS: 71 mmHg
CHL CUP TV REG PEAK VELOCITY: 389 cm/s
FS: 39 % (ref 28–44)
HEIGHTINCHES: 67 in
IVS/LV PW RATIO, ED: 0.73
LA ID, A-P, ES: 36 mm
LA diam end sys: 36 mm
LA diam index: 2.03 cm/m2
LV PW d: 11 mm — AB (ref 0.6–1.1)
LV TDI E'LATERAL: 5.66
LV TDI E'MEDIAL: 5.22
LVELAT: 5.66 cm/s
LVOT area: 2.27 cm2
LVOT diameter: 17 mm
TRMAXVEL: 389 cm/s
Weight: 2331.58 oz

## 2015-12-19 LAB — COMPREHENSIVE METABOLIC PANEL
ALT: 15 U/L (ref 14–54)
ANION GAP: 15 (ref 5–15)
AST: 21 U/L (ref 15–41)
Albumin: 3.3 g/dL — ABNORMAL LOW (ref 3.5–5.0)
Alkaline Phosphatase: 55 U/L (ref 38–126)
BUN: 7 mg/dL (ref 6–20)
CHLORIDE: 95 mmol/L — AB (ref 101–111)
CO2: 26 mmol/L (ref 22–32)
Calcium: 8.9 mg/dL (ref 8.9–10.3)
Creatinine, Ser: 0.71 mg/dL (ref 0.44–1.00)
Glucose, Bld: 138 mg/dL — ABNORMAL HIGH (ref 65–99)
Potassium: 3 mmol/L — ABNORMAL LOW (ref 3.5–5.1)
SODIUM: 136 mmol/L (ref 135–145)
Total Bilirubin: 1.4 mg/dL — ABNORMAL HIGH (ref 0.3–1.2)
Total Protein: 6.7 g/dL (ref 6.5–8.1)

## 2015-12-19 LAB — HEMOGLOBIN AND HEMATOCRIT, BLOOD
HCT: 29.6 % — ABNORMAL LOW (ref 36.0–46.0)
HEMATOCRIT: 28.9 % — AB (ref 36.0–46.0)
HEMATOCRIT: 29.8 % — AB (ref 36.0–46.0)
HEMOGLOBIN: 8.8 g/dL — AB (ref 12.0–15.0)
HEMOGLOBIN: 9.1 g/dL — AB (ref 12.0–15.0)
Hemoglobin: 9.1 g/dL — ABNORMAL LOW (ref 12.0–15.0)

## 2015-12-19 LAB — CBC
HCT: 30.8 % — ABNORMAL LOW (ref 36.0–46.0)
HEMOGLOBIN: 9.3 g/dL — AB (ref 12.0–15.0)
MCH: 23.1 pg — AB (ref 26.0–34.0)
MCHC: 30.2 g/dL (ref 30.0–36.0)
MCV: 76.4 fL — AB (ref 78.0–100.0)
PLATELETS: 365 10*3/uL (ref 150–400)
RBC: 4.03 MIL/uL (ref 3.87–5.11)
RDW: 18.4 % — ABNORMAL HIGH (ref 11.5–15.5)
WBC: 8.3 10*3/uL (ref 4.0–10.5)

## 2015-12-19 LAB — GLUCOSE, CAPILLARY
GLUCOSE-CAPILLARY: 132 mg/dL — AB (ref 65–99)
GLUCOSE-CAPILLARY: 135 mg/dL — AB (ref 65–99)
GLUCOSE-CAPILLARY: 97 mg/dL (ref 65–99)
GLUCOSE-CAPILLARY: 125 mg/dL — AB (ref 65–99)
Glucose-Capillary: 135 mg/dL — ABNORMAL HIGH (ref 65–99)
Glucose-Capillary: 242 mg/dL — ABNORMAL HIGH (ref 65–99)

## 2015-12-19 LAB — PROTIME-INR
INR: 1.33 (ref 0.00–1.49)
PROTHROMBIN TIME: 16.6 s — AB (ref 11.6–15.2)

## 2015-12-19 LAB — URINALYSIS, DIPSTICK ONLY
GLUCOSE, UA: NEGATIVE mg/dL
Hgb urine dipstick: NEGATIVE
Ketones, ur: 15 mg/dL — AB
LEUKOCYTES UA: NEGATIVE
Nitrite: NEGATIVE
PH: 7 (ref 5.0–8.0)
Protein, ur: NEGATIVE mg/dL
Specific Gravity, Urine: 1.046 — ABNORMAL HIGH (ref 1.005–1.030)

## 2015-12-19 LAB — STREP PNEUMONIAE URINARY ANTIGEN: STREP PNEUMO URINARY ANTIGEN: NEGATIVE

## 2015-12-19 LAB — HEMOGLOBIN A1C
HEMOGLOBIN A1C: 6.3 % — AB (ref 4.8–5.6)
Mean Plasma Glucose: 134 mg/dL

## 2015-12-19 MED ORDER — HALOPERIDOL LACTATE 5 MG/ML IJ SOLN
INTRAMUSCULAR | Status: AC
  Administered 2015-12-19: 1 mg via INTRAVENOUS
  Filled 2015-12-19: qty 1

## 2015-12-19 MED ORDER — GUAIFENESIN 100 MG/5ML PO SOLN
5.0000 mL | Freq: Three times a day (TID) | ORAL | Status: DC
  Administered 2015-12-19 – 2015-12-23 (×14): 100 mg via ORAL
  Filled 2015-12-19 (×3): qty 5
  Filled 2015-12-19: qty 25
  Filled 2015-12-19 (×3): qty 5
  Filled 2015-12-19: qty 25
  Filled 2015-12-19 (×6): qty 5

## 2015-12-19 MED ORDER — FOSPHENYTOIN SODIUM 500 MG PE/10ML IJ SOLN
5.0000 mg/kg | Freq: Once | INTRAMUSCULAR | Status: AC
  Administered 2015-12-19: 330.5 mg via INTRAVENOUS
  Filled 2015-12-19: qty 6.61

## 2015-12-19 MED ORDER — ASPIRIN EC 325 MG PO TBEC
325.0000 mg | DELAYED_RELEASE_TABLET | Freq: Every day | ORAL | Status: DC
  Administered 2015-12-19 – 2015-12-23 (×5): 325 mg via ORAL
  Filled 2015-12-19 (×5): qty 1

## 2015-12-19 MED ORDER — LEVALBUTEROL HCL 1.25 MG/0.5ML IN NEBU
1.2500 mg | INHALATION_SOLUTION | Freq: Two times a day (BID) | RESPIRATORY_TRACT | Status: DC
  Administered 2015-12-19 – 2015-12-24 (×10): 1.25 mg via RESPIRATORY_TRACT
  Filled 2015-12-19 (×10): qty 0.5

## 2015-12-19 MED ORDER — PIPERACILLIN-TAZOBACTAM 3.375 G IVPB
3.3750 g | Freq: Three times a day (TID) | INTRAVENOUS | Status: DC
  Administered 2015-12-19 – 2015-12-23 (×12): 3.375 g via INTRAVENOUS
  Filled 2015-12-19 (×13): qty 50

## 2015-12-19 MED ORDER — LACOSAMIDE 50 MG PO TABS
100.0000 mg | ORAL_TABLET | Freq: Two times a day (BID) | ORAL | Status: DC
  Administered 2015-12-19 – 2015-12-20 (×2): 100 mg via ORAL
  Filled 2015-12-19 (×2): qty 2

## 2015-12-19 MED ORDER — LACOSAMIDE 50 MG PO TABS
100.0000 mg | ORAL_TABLET | Freq: Two times a day (BID) | ORAL | Status: DC
  Filled 2015-12-19: qty 2

## 2015-12-19 MED ORDER — STARCH (THICKENING) PO POWD
ORAL | Status: DC | PRN
  Filled 2015-12-19: qty 227

## 2015-12-19 MED ORDER — PHENYTOIN SODIUM EXTENDED 100 MG PO CAPS: 300.0000 mg | ORAL_CAPSULE | Freq: Every day | ORAL | Status: DC

## 2015-12-19 MED ORDER — INSULIN ASPART 100 UNIT/ML ~~LOC~~ SOLN
0.0000 [IU] | Freq: Three times a day (TID) | SUBCUTANEOUS | Status: DC
  Administered 2015-12-19: 2 [IU] via SUBCUTANEOUS
  Administered 2015-12-20: 8 [IU] via SUBCUTANEOUS
  Administered 2015-12-20: 3 [IU] via SUBCUTANEOUS
  Administered 2015-12-20: 8 [IU] via SUBCUTANEOUS
  Administered 2015-12-21: 2 [IU] via SUBCUTANEOUS
  Administered 2015-12-21 (×2): 5 [IU] via SUBCUTANEOUS
  Administered 2015-12-22: 3 [IU] via SUBCUTANEOUS
  Administered 2015-12-22: 11 [IU] via SUBCUTANEOUS
  Administered 2015-12-23: 8 [IU] via SUBCUTANEOUS

## 2015-12-19 MED ORDER — INSULIN ASPART 100 UNIT/ML ~~LOC~~ SOLN
0.0000 [IU] | Freq: Every day | SUBCUTANEOUS | Status: DC
  Administered 2015-12-19: 2 [IU] via SUBCUTANEOUS
  Administered 2015-12-22: 4 [IU] via SUBCUTANEOUS

## 2015-12-19 MED ORDER — POTASSIUM CHLORIDE 20 MEQ/15ML (10%) PO SOLN
40.0000 meq | Freq: Two times a day (BID) | ORAL | Status: DC
  Administered 2015-12-19 – 2015-12-23 (×9): 40 meq via ORAL
  Filled 2015-12-19 (×9): qty 30

## 2015-12-19 MED ORDER — LACOSAMIDE 200 MG/20ML IV SOLN
200.0000 mg | Freq: Once | INTRAVENOUS | Status: AC
  Administered 2015-12-19: 200 mg via INTRAVENOUS
  Filled 2015-12-19: qty 20

## 2015-12-19 MED ORDER — PHENYTOIN SODIUM EXTENDED 100 MG PO CAPS
100.0000 mg | ORAL_CAPSULE | Freq: Three times a day (TID) | ORAL | Status: DC
  Administered 2015-12-19 – 2015-12-21 (×7): 100 mg via ORAL
  Filled 2015-12-19 (×8): qty 1

## 2015-12-19 MED ORDER — IOPAMIDOL (ISOVUE-370) INJECTION 76%
INTRAVENOUS | Status: AC
  Administered 2015-12-19: 50 mL
  Filled 2015-12-19: qty 50

## 2015-12-19 MED ORDER — IPRATROPIUM BROMIDE 0.02 % IN SOLN
0.5000 mg | Freq: Two times a day (BID) | RESPIRATORY_TRACT | Status: DC
  Administered 2015-12-19 – 2015-12-24 (×10): 0.5 mg via RESPIRATORY_TRACT
  Filled 2015-12-19 (×10): qty 2.5

## 2015-12-19 MED ORDER — HYDRALAZINE HCL 20 MG/ML IJ SOLN
10.0000 mg | INTRAMUSCULAR | Status: DC | PRN
  Administered 2015-12-21 – 2015-12-23 (×2): 10 mg via INTRAVENOUS
  Filled 2015-12-19 (×2): qty 1

## 2015-12-19 MED ORDER — CETYLPYRIDINIUM CHLORIDE 0.05 % MT LIQD
7.0000 mL | Freq: Two times a day (BID) | OROMUCOSAL | Status: DC
  Administered 2015-12-19 – 2015-12-24 (×10): 7 mL via OROMUCOSAL

## 2015-12-19 MED ORDER — HALOPERIDOL LACTATE 5 MG/ML IJ SOLN
1.0000 mg | Freq: Once | INTRAMUSCULAR | Status: AC
Start: 2015-12-19 — End: 2015-12-19
  Administered 2015-12-19: 1 mg via INTRAVENOUS
  Filled 2015-12-19: qty 1

## 2015-12-19 MED ORDER — GABAPENTIN 100 MG PO CAPS
100.0000 mg | ORAL_CAPSULE | Freq: Every day | ORAL | Status: DC
  Administered 2015-12-19: 100 mg via ORAL
  Filled 2015-12-19: qty 1

## 2015-12-19 MED ORDER — QUETIAPINE FUMARATE 50 MG PO TABS
50.0000 mg | ORAL_TABLET | Freq: Every day | ORAL | Status: DC
  Administered 2015-12-20 – 2015-12-22 (×3): 50 mg via ORAL
  Filled 2015-12-19 (×3): qty 1

## 2015-12-19 NOTE — Plan of Care (Signed)
Problem: Self-Care: Goal: Ability to participate in self-care as condition permits will improve Outcome: Progressing Patient waxing and waning but when able can hold drinks in hand and take sips appropriately. Working with PT/OT/Speech to continue to progress.  Problem: Coping: Goal: Ability to adjust to condition or change in health will improve Outcome: Progressing Disorder discussed with patient and husband, hopeful medication will help improve symptoms. Able to express feelings. Continue to follow.

## 2015-12-19 NOTE — Progress Notes (Signed)
  Echocardiogram 2D Echocardiogram has been performed.  Jocelyn Burgess 12/19/2015, 3:56 PM

## 2015-12-19 NOTE — Progress Notes (Signed)
LTM EEG started 

## 2015-12-19 NOTE — Progress Notes (Signed)
STROKE TEAM PROGRESS NOTE   SUBJECTIVE (INTERVAL HISTORY) Her husband is at the bedside.  She was initially sleeping but easily arousable. However, she appears to be confused, disorientated and not able to recognize her husband which is not her baseline. During the round, she had a brief episode of left UE and LE rhythmic shaking and jerking lasting only about 10 sec and stopped. Continue to have left UE and LE weakness with possible left hemianopia or hemi-neglect.    OBJECTIVE Temp:  [97.5 F (36.4 C)-99.1 F (37.3 C)] 97.5 F (36.4 C) (07/05 1125) Pulse Rate:  [62-75] 65 (07/05 1125) Cardiac Rhythm:  [-] Normal sinus rhythm (07/05 0714) Resp:  [13-19] 16 (07/05 1125) BP: (104-187)/(50-108) 139/58 mmHg (07/05 1125) SpO2:  [93 %-97 %] 93 % (07/05 1125)   Recent Labs Lab 12/18/15 1951 12/18/15 2314 12/19/15 0349 12/19/15 0814 12/19/15 1122  GLUCAP 181* 160* 132* 135* 135*    Recent Labs Lab 12/17/15 1811 12/17/15 1819 12/17/15 2220 12/18/15 0917 12/19/15 0539  NA 134* 137  --  135 136  K 2.5* 2.6*  --  3.4* 3.0*  CL 101 99*  --  102 95*  CO2 19*  --   --  25 26  GLUCOSE 269* 265*  --  140* 138*  BUN 13 13  --  12 7  CREATININE 0.76 0.50  --  0.58 0.71  CALCIUM 9.1  --   --  8.8* 8.9  MG  --   --  1.3*  --   --   PHOS  --   --  3.0  --   --     Recent Labs Lab 12/17/15 1811 12/18/15 0917 12/19/15 0539  AST 21 20 21   ALT 17 15 15   ALKPHOS 59 51 55  BILITOT 0.4 0.8 1.4*  PROT 7.3 6.8 6.7  ALBUMIN 3.4* 3.4* 3.3*    Recent Labs Lab 12/17/15 1811  12/18/15 0917 12/18/15 2228 12/19/15 0201 12/19/15 0539 12/19/15 0954  WBC 9.1  --  7.5  --   --  8.3  --   NEUTROABS 7.3  --   --   --   --   --   --   HGB 6.5*  < > 7.8* 9.1* 8.8* 9.3* 9.1*  HCT 23.1*  < > 26.2* 29.7* 28.9* 30.8* 29.6*  MCV 73.3*  --  75.1*  --   --  76.4*  --   PLT 452*  --  455*  --   --  365  --   < > = values in this interval not displayed.  Recent Labs Lab 12/17/15 2332  12/18/15 0501 12/18/15 0917 12/18/15 2228  TROPONINI 0.08* 0.06* 0.06* 0.04*    Recent Labs  12/17/15 1811 12/19/15 0539  LABPROT 28.3* 16.6*  INR 2.70* 1.33    Recent Labs  12/17/15 2029  COLORURINE YELLOW  LABSPEC 1.013  PHURINE 7.0  GLUCOSEU 100*  HGBUR NEGATIVE  BILIRUBINUR NEGATIVE  KETONESUR 15*  PROTEINUR NEGATIVE  NITRITE NEGATIVE  LEUKOCYTESUR NEGATIVE       Component Value Date/Time   CHOL 103 12/18/2015 0501   TRIG 111 12/18/2015 0501   HDL 44 12/18/2015 0501   CHOLHDL 2.3 12/18/2015 0501   VLDL 22 12/18/2015 0501   LDLCALC 37 12/18/2015 0501   Lab Results  Component Value Date   HGBA1C 6.3* 12/18/2015      Component Value Date/Time   LABOPIA NONE DETECTED 12/17/2015 2028   LABOPIA POSITIVE* 08/26/2015 [id number]  COCAINSCRNUR NONE DETECTED 12/17/2015 2028   COCAINSCRNUR NONE DETECTED 08/26/2015 0514   LABBENZ NONE DETECTED 12/17/2015 2028   LABBENZ POSITIVE* 08/26/2015 0514   AMPHETMU NONE DETECTED 12/17/2015 2028   AMPHETMU NONE DETECTED 08/26/2015 0514   THCU NONE DETECTED 12/17/2015 2028   THCU NONE DETECTED 08/26/2015 0514   LABBARB NONE DETECTED 12/17/2015 2028   LABBARB NONE DETECTED 08/26/2015 0514     Recent Labs Lab 12/17/15 1811  ETH <5    I have personally reviewed the radiological images below and agree with the radiology interpretations.  Ct Chest W Contrast  12/18/2015  IMPRESSION: Development of bilateral pleural effusions right larger than left with dependent atelectasis bilaterally and near complete collapse of the right lower lobe. Interstitial and alveolar pattern suggesting fluid overload/ pulmonary edema diffusely. Multiple spiculated densities in the lower lobes have done smaller/retracted somewhat since the study of May. This favors inflammatory disease. Tumor hidden amongst this is not excluded. Right hilar lymphadenopathy persists, difficult to measure accurately because of the right lower lobe collapse. Note that the  right lower lobe bronchus is now full of either mucous, pus or tumor. Enlargement of a pretracheal lymph node image 21. Previously this measured 12 mm. Today it measured 19 mm. 8   Mr Brain Wo Contrast  12/18/2015 IMPRESSION: Prematurely truncated scan due to lack of patient cooperation. Acute LEFT posterior frontal cortical infarct, 5 mm in size. This appears nonhemorrhagic. Atrophy and small vessel disease.   Ct Abdomen Pelvis W Contrast  12/18/2015  IMPRESSION: No significant finding in the abdomen or pelvis of an acute nature. Aortic atherosclerosis. Previously seen T12 fracture which has not progressed.   Ct Head Code Stroke W/o Cm  12/17/2015 IMPRESSION: No acute intracranial abnormality. Atrophy, chronic microvascular disease.   EEG - This is an abnormal EEG due to mild diffuse generalized slowing of the background. This is consistent with the patient's known history of dementia.  She had a single electrographic seizure that appears to start in the right temporal region with rapid generalization. This lasted about 50 seconds in total and was associated clinically with versive left head turn and unresponsiveness.   CTA head and neck - pending  2D Echocardiogram  - pending  LTM EEG - pending   PHYSICAL EXAM  Temp:  [97.5 F (36.4 C)-99.1 F (37.3 C)] 97.5 F (36.4 C) (07/05 1125) Pulse Rate:  [62-75] 65 (07/05 1125) Resp:  [13-19] 16 (07/05 1125) BP: (104-187)/(50-108) 139/58 mmHg (07/05 1125) SpO2:  [93 %-97 %] 93 % (07/05 1125)  General - Well nourished, well developed, in no apparent distress.  Ophthalmologic - fundi not visualized due to noncooperation.  Cardiovascular - Regular rate and rhythm with no murmur.  Neck - supple, no carotid bruits  Mental Status -  Sleepy but easily arousable, disorientated to self, place, people, age and time.  Able to follow simple commands, but paucity of speech and lack of content, able to say simple words but not sentences. However,  no dysarthria. Naming and repetition not cooperative.   Cranial Nerves II - XII - II - not blinking to visual threat on the left III, IV, VI - Extraocular movements intact. V - Facial sensation intact bilaterally. VII - Facial movement intact bilaterally VIII - Hearing & vestibular intact bilaterally. X - Palate elevates symmetrically. XI - Chin turning & shoulder shrug intact bilaterally. XII - Tongue protrusion intact.  Motor Strength - The patient's strength was 5/5 in RUE and RLE, however, LUE 3-/5, and  LLE 1/5.  Bulk was normal and fasciculations were absent.   Motor Tone - Muscle tone was assessed at the neck and appendages and was normal.  Reflexes - The patient's reflexes were symmetrical in all extremities and she had left babinski positive.  Sensory - not cooperative.    Coordination - LUE significantly ataxic but likely proportional to weakness, however, she does have LUE asterixis.  Gait and Station - not tested.  Noted during exam, pt had 10 sec brief LUE and LLE rhythmic shaking and jerking, no LOC, stopped on it own.    ASSESSMENT/PLAN Jocelyn Burgess is a 82 y.o. female with history of DM, COPD, HTN, HLD, depression, dementia, PE in 07/2015 on Xarelto but currently on hold due to anemia admitted for GTC seizure. she has continued left UE and LE weakness, and EEG showed seizure coming out from right temporal region. MRI showed left CR/SO small infarct which is incidental finding and not correlating to her symptoms.    Seizure with prolonged Todd's paralysis - active clinical seizure on round  MRI  Not tolerating well for seizure work up   CT without contrast negative  EEG showed seizure originating from right temporal lobe  Continued seizure activity on round  LTM EEG requested  S/p dilantin load, post load total dilantin level 15, will give 5mg /kg fosphenytoin load and continue dilantin 100mg  tid. Daily dilantin level  vimpat 200mg  load with 100mg  bid  for maintenance dose  D/c gabapentin which is too mild for treating clinical seizures  Pt will need MRI with and without contrast once able to tolerate MRI  Consider LP if develop fever or worsening neuro status  Stroke: incidental left frontal subcortical (CR/SO) infarct - likely due to small vessel disease, not correlating to current symptoms.   MRI left small CR/SO infarct  CTA head and neck - pending  2D Echo  pending  LDL 37  HgbA1c 6.3  SCDs for VTE prophylaxis  Diet heart healthy/carb modified Room service appropriate?: Yes; Fluid consistency:: Thin   Xarelto (rivaroxaban) daily prior to admission, now on aspirin 325 mg daily. Pt has severe anemia requiring blood transfusion, Xarelto currently on hold.   Ongoing aggressive stroke risk factor management  Therapy recommendations:  pending  Disposition:  Pending  Anemia  Hb 6.5 -> 9.3  S/p blood transfusion  FOB positive  Xarelto on hold   May consider GI work up   B/l pleural effusion and RLL atelectasis   Underlying tumor not excluded  Management as per primary team  On unasyn  May need tumor work up once stabilized  History of PE on Xarelto  PE in 07/2015 on Xarelto since  LE venous doppler pending  Xarelto on hold due to anemia requiring transfusion  If DVT present, will need to consider IVC filter  Diabetes  HgbA1c 6.3 goal < 7.0  Controlled  Hypertension  Home meds:   clonidine Permissive hypertension and gradually normalized within 5-7 days. Currently on clonidine  Stable  Hyperlipidemia  Home meds:  lipitor    Currently on lipitor  LDL 37, goal < 70  Continue statin at discharge  Other Stroke Risk Factors  Advanced age  Other Active Problems  Dementia on aricept and seroquel  Depression on zoloft  Other Pertinent History  COPD  ? Cancer history  Hospital day # 2  The patient continues to have clinic seizures during round. She is at risk for status  epilepticus, recurrent strokes and neurological worsening. I  had long discussion with husband at bedside. She needs ongoing seizure control, monitoring and stroke work up and aggressive risk factor modification.  Jocelyn Hawking, MD PhD Stroke Neurology 12/19/2015 1:24 PM    To contact Stroke Continuity provider, please refer to [email or web address] After hours, contact General Neurology

## 2015-12-19 NOTE — Evaluation (Signed)
Physical Therapy Evaluation Patient Details Name: Jocelyn Burgess MRN: [id number] DOB: 09-Jun-1932 Today's Date: 12/19/2015   History of Present Illness  Patient is a 82 y/o female with hx of COPD, chronic respiratory failure, DM, HTN, breast ca, depression, dementia, PE, seizures presents from [name] due to witnessed seizure lasting 10 minutes. Found to have significant anemia Hemoccult-positive stool and hypokalemia. MRI-Acute LEFT posterior frontal cortical infarct, 5 mm in size  Clinical Impression  Patient presents with left inattention, left visual field deficits, confusion, balance deficits and impaired mobility s/p above. Tolerated sitting EOB with Max A due to heavy left lateral lean. Not able to stand with assist of 2. PTA, pt ambulatory using RW for support. Despite hx of dementia, per spouse, pt is not at cognitive baseline. Would benefit from post acute rehab to maximize independence and mobility and ease burden of care prior to return home. Will follow acutely.     Follow Up Recommendations SNF    Equipment Recommendations  Wheelchair (measurements PT);Wheelchair cushion (measurements PT)    Recommendations for Other Services       Precautions / Restrictions Precautions Precautions: Fall Precaution Comments: left inattention, left visual field cut? Restrictions Weight Bearing Restrictions: No      Mobility  Bed Mobility Overal bed mobility: Needs Assistance;+2 for physical assistance Bed Mobility: Supine to Sit     Supine to sit: Max assist;+2 for physical assistance;HOB elevated     General bed mobility comments: Assist bringing BLEs to EOB, scooting bottom and elevating trunk using chuck pad. No active movement of LLE.  Transfers Overall transfer level: Needs assistance Equipment used: 2 person hand held assist Transfers: Sit to/from Stand Sit to Stand: Total assist;+2 physical assistance         General transfer comment: Attempted to stand to  clear bottom from bed however pt unable; increased trunk flexion and forward lean.   Ambulation/Gait                Stairs            Wheelchair Mobility    Modified Rankin (Stroke Patients Only) Modified Rankin (Stroke Patients Only) Pre-Morbid Rankin Score: Moderately severe disability Modified Rankin: Severe disability     Balance Overall balance assessment: Needs assistance Sitting-balance support: Feet supported;Single extremity supported Sitting balance-Leahy Scale: Zero Sitting balance - Comments: Pt with strong left lateral lean sitting EOB with forward trunk flexion; able to extend neck but not able to get to midline. Max A for static sitting balance. Focused on getting pt to attend to left side visually and to use LUE/LE using spouse. Difficult to get accurate abilities due to hx of dementia. Postural control: Left lateral lean Standing balance support: During functional activity Standing balance-Leahy Scale: Zero                               Pertinent Vitals/Pain Pain Assessment: Faces Faces Pain Scale: No hurt    Home Living Family/patient expects to be discharged to:: Skilled nursing facility     Type of Home: Assisted living         Home Equipment: Kasandra Knudsen - single point;Walker - 2 wheels      Prior Function Level of Independence: Needs assistance   Gait / Transfers Assistance Needed: Ambulates with RW PTA.           Hand Dominance   Dominant Hand: Right    Extremity/Trunk Assessment  Upper Extremity Assessment: Defer to OT evaluation ( Jerking like involuntary movements noted LUE)           Lower Extremity Assessment: LLE deficits/detail;Difficult to assess due to impaired cognition   LLE Deficits / Details: No motor movement in LLE during exam/functional mobility. Able to feel pain, but not light touch.   Cervical / Trunk Assessment: Kyphotic  Communication   Communication: No difficulties  Cognition  Arousal/Alertness: Lethargic;Suspect due to medications Behavior During Therapy: [facility] for tasks assessed/performed Overall Cognitive Status: Impaired/Different from baseline Area of Impairment: Orientation;Safety/judgement;Problem solving;Following commands Orientation Level: Disoriented to;Place;Time;Situation     Following Commands: Follows one step commands inconsistently;Follows one step commands with increased time Safety/Judgement: Decreased awareness of safety;Decreased awareness of deficits   Problem Solving: Slow processing;Decreased initiation;Difficulty sequencing;Requires verbal cues;Requires tactile cues General Comments: Pt with right gaze preference. Able to gaze left with max multimodal cues but left inattention noted.     General Comments General comments (skin integrity, edema, etc.): Spouse present during session.    Exercises        Assessment/Plan    PT Assessment Patient needs continued PT services  PT Diagnosis Hemiplegia non-dominant side;Difficulty walking;Altered mental status   PT Problem List Decreased strength;Decreased range of motion;Decreased cognition;Impaired sensation;Decreased activity tolerance;Decreased balance;Decreased mobility;Decreased safety awareness;Impaired tone  PT Treatment Interventions Balance training;Gait training;Functional mobility training;Therapeutic activities;Therapeutic exercise;Wheelchair mobility training;Patient/family education;Neuromuscular re-education;DME instruction   PT Goals (Current goals can be found in the Care Plan section) Acute Rehab PT Goals Patient Stated Goal: per husband to get her back to baseline PT Goal Formulation: With patient/family Time For Goal Achievement: 01/02/16 Potential to Achieve Goals: Fair    Frequency Min 3X/week   Barriers to discharge Decreased caregiver support      Co-evaluation               End of Session Equipment Utilized During Treatment: Gait belt Activity  Tolerance: Patient limited by lethargy Patient left: in bed;with call bell/phone within reach;with bed alarm set;with family/visitor present;with nursing/sitter in room;with SCD's reapplied Nurse Communication: Mobility status;Need for lift equipment         Time: 1120-1147 PT Time Calculation (min) (ACUTE ONLY): 27 min   Charges:   PT Evaluation $PT Eval Moderate Complexity: 1 Procedure PT Treatments $Therapeutic Activity: 8-22 mins   PT G Codes:        Adela Esteban A Sunaina Ferrando 12/19/2015, 1:20 PM Wray Kearns, [place], DPT [phone]

## 2015-12-19 NOTE — Progress Notes (Signed)
Pharmacy Antibiotic Note  Jocelyn Burgess is a 82 y.o. female admitted on 12/17/2015 with pneumonia.  Pharmacy has been consulted for Zosyn dosing.  Plan: Zosyn 3.375g IV q8h (4 hour infusion).  Pharmacy to sign off and follow peripherally for renal changes  Height: 5\' 7"  (170.2 cm) Weight: 145 lb 11.6 oz (66.1 kg) IBW/kg (Calculated) : 61.6  Temp (24hrs), Avg:98 F (36.7 C), Min:97.5 F (36.4 C), Max:98.7 F (37.1 C)   Recent Labs Lab 12/17/15 1811 12/17/15 1819 12/18/15 0917 12/19/15 0539  WBC 9.1  --  7.5 8.3  CREATININE 0.76 0.50 0.58 0.71  LATICACIDVEN  --   --  0.9  --     Estimated Creatinine Clearance: 53.6 mL/min (by C-G formula based on Cr of 0.71).    Allergies  Allergen Reactions  . Ciprofloxacin Other (See Comments)    Reaction:  Unknown   . Codeine Other (See Comments)    Reaction:  Unknown   . Inhaler Decongestant [Methamphetamine] Other (See Comments)    Reaction:  Unknown   . Molds & Smuts Other (See Comments)    Reaction:  Wheezing/itchy,watery eyes   . Other Other (See Comments)    Pt is allergic to strawberries per MAR   Reaction:  Unknown   . Albuterol Sulfate Palpitations and Other (See Comments)    Pt states that she is allergic to the [facility] brand only.       Thank you for allowing pharmacy to be a part of this patient's care.  Tad Moore 12/19/2015 5:50 PM

## 2015-12-19 NOTE — Evaluation (Signed)
Clinical/Bedside Swallow Evaluation Patient Details  Name: Jocelyn Burgess MRN: [id number] Date of Birth: 15-Feb-1932  Today's Date: 12/19/2015 Time: SLP Start Time (ACUTE ONLY): 75 SLP Stop Time (ACUTE ONLY): 1624 SLP Time Calculation (min) (ACUTE ONLY): 13 min  Past Medical History:  Past Medical History  Diagnosis Date  . Allergy   . Cancer ([place])   . Diabetes mellitus without complication ([place])   . Arthritis   . Asthma   . Cataract   . COPD (chronic obstructive pulmonary disease) ([place])   . Depression     sometimes  . Hyperlipidemia   . Hypertension   . Oxygen deficiency   . Major depressive disorder, recurrent, in full remission with anxious distress ([place]) 11/23/2014  . Vertebral fracture, osteoporotic ([place]) 08/07/2015  . Anxiety   . Pulmonary embolism ([place])     Diagnosis 07/2015 in [place]. On Xarelto  . Seizure ([place]) 12/17/2015  . Symptomatic anemia 12/17/2015   Past Surgical History:  Past Surgical History  Procedure Laterality Date  . Breast surgery Left    HPI:  Pt is an 82 y.o. female with [place] of pulmonary embolism, dementia, COPD on chronic 2 - 3L of oxygen, HTN, DM2, remote history of breast cancer, tonic hyponatremia, recurrent hypokalemia, admitted on 7/3 after a witnessed seizure that lasted for 10 minutes, after which pt was unable to move L arm and leg. Pt "somewhat confused" at baseline. MRI showed acute L posterior frontal infarct. CXR showed prominence in R perihilar region concerning for mass or adenopathy. Pt passed RN stroke swallow screen on 7/3, bedside swallow eval order...   Assessment / Plan / Recommendation Clinical Impression  Pt's current mentation impacts her oral awareness, requiring Mod cues for acceptance and a/p transfer. Immediate coughing with thin liquids concerning for aspiration due to impaired timing. Per RN, pt's menation has been fluctuating throughout the day. Given the above, recommend Dys 1 diet and nectar thick liquids with full  supervision necessary. Will continue to follow for tolerance and advancement pending improved mentation.    Aspiration Risk  Mild aspiration risk;Moderate aspiration risk    Diet Recommendation Dysphagia 1 (Puree);Nectar-thick liquid   Liquid Administration via: Cup;Straw Medication Administration: Crushed with puree Supervision: Staff to assist with self feeding;Full supervision/cueing for compensatory strategies Compensations: Minimize environmental distractions;Slow rate;Small sips/bites Postural Changes: Seated upright at 90 degrees;Remain upright for at least 30 minutes after po intake    Other  Recommendations Oral Care Recommendations: Oral care BID Other Recommendations: Order thickener from pharmacy;Prohibited food (jello, ice cream, thin soups);Remove water pitcher;Have oral suction available   Follow up Recommendations  Skilled Nursing facility    Frequency and Duration min 2x/week  2 weeks       Prognosis Prognosis for Safe Diet Advancement: Good Barriers to Reach Goals: Cognitive deficits      Swallow Study   General HPI: Pt is an 82 y.o. female with [place] of pulmonary embolism, dementia, COPD on chronic 2 - 3L of oxygen, HTN, DM2, remote history of breast cancer, tonic hyponatremia, recurrent hypokalemia, admitted on 7/3 after a witnessed seizure that lasted for 10 minutes, after which pt was unable to move L arm and leg. Pt "somewhat confused" at baseline. MRI showed acute L posterior frontal infarct. CXR showed prominence in R perihilar region concerning for mass or adenopathy. Pt passed RN stroke swallow screen on 7/3, bedside swallow eval order... Type of Study: Bedside Swallow Evaluation Previous Swallow Assessment: none in chart Diet Prior to this Study: Regular;Thin liquids  Temperature Spikes Noted: No Respiratory Status: Nasal cannula History of Recent Intubation: No Behavior/Cognition: Alert;Requires cueing Oral Cavity Assessment: Within Functional  Limits Oral Care Completed by SLP: No Self-Feeding Abilities: Needs assist Patient Positioning: Upright in bed Baseline Vocal Quality: Normal    Oral/Motor/Sensory Function     Ice Chips Ice chips: Not tested   Thin Liquid Thin Liquid: Impaired Presentation: Straw Oral Phase Impairments: Poor awareness of bolus Pharyngeal  Phase Impairments: Suspected delayed Swallow;Cough - Immediate    Nectar Thick Nectar Thick Liquid: Impaired Presentation: Straw Oral Phase Impairments: Poor awareness of bolus Pharyngeal Phase Impairments: Suspected delayed Swallow   Honey Thick Honey Thick Liquid: Not tested   Puree Puree: Impaired Presentation: Spoon Oral Phase Impairments: Poor awareness of bolus Oral Phase Functional Implications: Oral holding   Solid   GO   Solid: Not tested       Germain Osgood, M.A. CCC-SLP [phone]  Germain Osgood 12/19/2015,5:08 PM

## 2015-12-19 NOTE — Evaluation (Addendum)
Speech Language Pathology Evaluation Patient Details Name: Jocelyn Burgess MRN: [id number] DOB: 1932/03/05 Today's Date: 12/19/2015 Time: [id number] SLP Time Calculation (min) (ACUTE ONLY): 20 min  Problem List:  Patient Active Problem List   Diagnosis Date Noted  . Seizure ([place]) 12/17/2015  . 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 Symptomatic anemia 12/17/2015  . Occult blood positive stool 12/17/2015  . Pulmonary nodule 12/17/2015  . Left hemiplegia ([place]) 12/17/2015  . TIA (transient ischemic attack) 12/17/2015  . Pneumonia 11/04/2015  . Acute delirium 08/22/2015  . Sepsis ([place]) 08/21/2015  . Anxiety 08/21/2015  . UTI (lower urinary tract infection) 08/21/2015  . Dementia 08/08/2015  . Dyspnea on exertion 08/07/2015  . Vertebral fracture, osteoporotic ([place]) 08/07/2015  . Fracture of rib of left side with routine healing 08/07/2015  . AP (abdominal pain)   . Abdominal pain 07/21/2015  . Acute hyponatremia 07/21/2015  . Hypokalemia 07/21/2015  . Left hip pain 07/09/2015  . Benign essential tremor 07/09/2015  . Venous stasis dermatitis of both lower extremities 07/03/2015  . Bilateral edema of lower extremity 05/30/2015  . Bleeding nose 05/08/2015  . Morning headache 05/01/2015  . Intermittent confusion 04/17/2015  . Cough 04/17/2015  . Constipation, slow transit 04/09/2015  . Allergic rhinitis with postnasal drip 04/09/2015  . Hypertension goal BP (blood pressure) < 140/90 01/22/2015  . Major depressive disorder, recurrent, in full remission with anxious distress ([place]) 11/23/2014  . Heavy cigarette smoker 11/23/2014  . COPD, severe ([place]) 11/23/2014  . DD (diverticular disease) 11/23/2014  . Personal history of malignant neoplasm of breast 11/23/2014  . History of digestive disease 11/23/2014  . Personal history of healed traumatic fracture 11/23/2014  . HLD (hyperlipidemia) 11/23/2014  . Peripheral neuropathic pain ([place]) 11/23/2014  . At risk for falling  11/23/2014  . Diabetes mellitus type 2, controlled, with complications ([place]) [id number]   Past Medical History:  Past Medical History  Diagnosis Date  . Allergy   . Cancer ([place])   . Diabetes mellitus without complication ([place])   . Arthritis   . Asthma   . Cataract   . COPD (chronic obstructive pulmonary disease) ([place])   . Depression     sometimes  . Hyperlipidemia   . Hypertension   . Oxygen deficiency   . Major depressive disorder, recurrent, in full remission with anxious distress ([place]) 11/23/2014  . Vertebral fracture, osteoporotic ([place]) 08/07/2015  . Anxiety   . Pulmonary embolism ([place])     Diagnosis 07/2015 in [place]. On Xarelto  . Seizure ([place]) 12/17/2015  . Symptomatic anemia 12/17/2015   Past Surgical History:  Past Surgical History  Procedure Laterality Date  . Breast surgery Left    HPI:  Pt is an 82 y.o. female with [place] of pulmonary embolism, dementia, COPD on chronic 2 - 3L of oxygen, HTN, DM2, remote history of breast cancer, tonic hyponatremia, recurrent hypokalemia, admitted on 7/3 after a witnessed seizure that lasted for 10 minutes, after which pt was unable to move L arm and leg. Pt "somewhat confused" at baseline. MRI showed acute L posterior frontal infarct. CXR showed prominence in R perihilar region concerning for mass or adenopathy. Pt passed RN stroke swallow screen on 7/3, speech language eval ordered as part of stroke workup. Assessment / Plan / Recommendation Clinical Impression  Pt currently presenting with moderate cognitive deficits; pt with baseline dementia but husband at bedside reported that pt currently more confused compared to baseline. Pt able to state name but unable to state date  of birth, location, date, or situation despite verbal/ visual cues. Husband reported that at baseline, pt disoriented to location and date since moving to assisted living facility. Pt also demonstrating deficits in functional problem solving, safety awareness, and  short- and long-term memory; attempting to get out of bed, not able to recall PT session which occurred just prior to this speech therapy eval. Motor speech skills WNL, difficult to assess word finding as pt had difficulties understanding the task. No word finding difficulties noted in conversation; however, most of pt's responses were brief. Given these findings, pt would benefit from speech therapy while in the acute care setting to increase safety, orientation, and problem solving skills and to further assess expressive language, along with providing family education/ strategies. Will continue to follow.  Spoke with RN re: evaluation results and pt's continued NPO status. Pt initially passed RN stroke swallow screen but now see that there are concerns for aspiration PNA. RN to speak with MD about diet. If needed, please re-order bedside swallow eval. Thank you.    SLP Assessment  Patient needs continued Speech Lanaguage Pathology Services    Follow Up Recommendations  Skilled Nursing facility    Frequency and Duration min 1 x/week  2 weeks      SLP Evaluation Prior Functioning  Cognitive/Linguistic Baseline: Baseline deficits Baseline deficit details: dementia- husband stated that pt typically confused about date and location Type of Home: Assisted living  Lives With: Spouse   Cognition  Overall Cognitive Status: Impaired/Different from baseline Arousal/Alertness: Awake/alert Orientation Level: Oriented to person;Disoriented to place;Disoriented to time;Disoriented to situation Attention: Sustained;Selective Sustained Attention: Appears intact Selective Attention: Appears intact Memory: Impaired Memory Impairment: Decreased recall of new information;Decreased short term memory;Decreased long term memory Decreased Long Term Memory: Verbal basic Decreased Short Term Memory: Verbal basic Awareness: Impaired Awareness Impairment: Intellectual impairment;Anticipatory  impairment Problem Solving: Impaired Problem Solving Impairment: Functional basic Safety/Judgment: Impaired    Comprehension  Auditory Comprehension Overall Auditory Comprehension: Appears within functional limits for tasks assessed Yes/No Questions: Within Functional Limits Commands:  (WFL for functional 1-step) Conversation: Simple    Expression Expression Primary Mode of Expression: Verbal Verbal Expression Overall Verbal Expression: Impaired Initiation: No impairment Automatic Speech: Name Level of Generative/Spontaneous Verbalization: Sentence Naming: Impairment Confrontation: Impaired Convergent: 50-74% accurate Non-Verbal Means of Communication: Not applicable Written Expression Dominant Hand: Right Written Expression: Not tested   Oral / Motor  Oral Motor/Sensory Function Overall Oral Motor/Sensory Function: Within functional limits Motor Speech Overall Motor Speech: Appears within functional limits for tasks assessed Respiration: Within functional limits Phonation: Normal Resonance: Within functional limits Articulation: Within functional limitis Intelligibility: Intelligible Motor Planning: Witnin functional limits Motor Speech Errors: Not applicable   GO                    Kern Reap, [place], CCC-SLP 12/19/2015, 10:08 AM [phone]

## 2015-12-19 NOTE — Clinical Social Work Note (Signed)
CSW aware that patient is from [facility] in [place], [place]. Per MD note, patient will likely not discharge for several days. CSW following progress.  Dayton Scrape, [place]

## 2015-12-19 NOTE — Evaluation (Signed)
Occupational Therapy Evaluation Patient Details Name: Jocelyn Burgess MRN: [id number] DOB: 09/24/1931 Today's Date: 12/19/2015    History of Present Illness Patient is a 82 y/o female with hx of COPD, chronic respiratory failure, DM, HTN, breast ca, depression, dementia, PE, seizures presents from [name] due to witnessed seizure lasting 10 minutes. Found to have significant anemia Hemoccult-positive stool and hypokalemia. MRI-Acute LEFT posterior frontal cortical infarct, 5 mm in size   Clinical Impression   PT admitted with seizure with hypokalemia. Pt currently with functional limitiations due to the deficits listed below (see OT problem list). PTA was walking with RW and living at ALF with (A) for adls due to cognitive deficits.  Pt will benefit from skilled OT to increase their independence and safety with adls and balance to allow discharge SNF. Pt currently unable to ambulate to reach ALF level that is required to return at this time.      Follow Up Recommendations  SNF;Supervision/Assistance - 24 hour    Equipment Recommendations  3 in 1 bedside comode;Wheelchair (measurements OT);Wheelchair cushion (measurements OT);Hospital bed    Recommendations for Other Services       Precautions / Restrictions Precautions Precautions: Fall Precaution Comments: seizure Restrictions Weight Bearing Restrictions: No      Mobility Bed Mobility Overal bed mobility: Needs Assistance;+2 for physical assistance Bed Mobility: Supine to Sit;Sit to Supine     Supine to sit: Max assist;+2 for physical assistance;+2 for safety/equipment Sit to supine: Max assist;+2 for physical assistance;+2 for safety/equipment   General bed mobility comments: Pt pushign with R Ue and strong L lean  Transfers Overall transfer level: Needs assistance Equipment used: 2 person hand held assist Transfers: Sit to/from Stand Sit to Stand: Total assist;+2 physical assistance         General  transfer comment: due to inability to sit EOB not attempted.     Balance Overall balance assessment: Needs assistance Sitting-balance support: Single extremity supported;Feet supported Sitting balance-Leahy Scale: Zero Sitting balance - Comments: Pt with strong left lateral lean sitting EOB with forward trunk flexion; able to extend neck but not able to get to midline. Max A for static sitting balance. Focused on getting pt to attend to left side visually and to use LUE/LE using spouse. Difficult to get accurate abilities due to hx of dementia. Postural control: Left lateral lean Standing balance support: During functional activity Standing balance-Leahy Scale: Zero                              ADL Overall ADL's : Needs assistance/impaired                                       General ADL Comments: Pt supine on arrival and agreeable to movement. pt positioned at EOB and becomes very restless and pulling at objects on R side. Pt states "come on this go. lets go" Pt unable to sustain static sitting due to L lean. pt with lack of awareness to L side deficits.      Vision Vision Assessment?: Vision impaired- to be further tested in functional context Additional Comments: visually attention to the R side this session but able to track L with cues   Perception     Praxis      Pertinent Vitals/Pain Pain Assessment: No/denies pain Faces Pain Scale: No hurt  Hand Dominance Right   Extremity/Trunk Assessment Upper Extremity Assessment Upper Extremity Assessment: Generalized weakness (tremor in L UE noted)   Lower Extremity Assessment Lower Extremity Assessment: Defer to PT evaluation LLE Deficits / Details: No motor movement in LLE during exam/functional mobility. Able to feel pain, but not light touch.    Cervical / Trunk Assessment Cervical / Trunk Assessment: Kyphotic   Communication Communication Communication: No difficulties   Cognition  Arousal/Alertness: Awake/alert Behavior During Therapy: WFL for tasks assessed/performed Overall Cognitive Status: History of cognitive impairments - at baseline Area of Impairment: Orientation;Safety/judgement;Problem solving;Following commands Orientation Level: Disoriented to;Place;Time;Situation     Following Commands: Follows one step commands inconsistently;Follows one step commands with increased time Safety/Judgement: Decreased awareness of safety;Decreased awareness of deficits   Problem Solving: Slow processing;Decreased initiation;Difficulty sequencing;Requires verbal cues;Requires tactile cues General Comments: Pt answering with witty responses but not directly answering questions. pt asked what is your name? Pt states "old come along" When asked by staff "what do your friends call you?" pt states "oh come along lets go. thats how they call me" Pt responding to spouse with awareness to his familiar face and completing task with his encouragement. pt upon sitting reports "i am going to throw up" Spouse states She says that to avoid doing it but she really like rehab. tell her you are from rehab and she will do more.    General Comments       Exercises       Shoulder Instructions      Home Living Family/patient expects to be discharged to:: Assisted living     Type of Home: House                       Home Equipment: Walker - 2 wheels   Additional Comments: Pt lives at an ALF in [place] that has 12 rooms and 2-3 staff members present to (A) patients. Pt walks with RW and has (A) for meal setup and adls. Son reports that she does minimal walking with staff unless he is there to help her since she has no therapy any more. Pt was previously at [facility] care but was D/C from therapy services and sent to ALF housing.   Lives With: Other (Comment) (ALF)    Prior Functioning/Environment Level of Independence: Needs assistance  Gait / Transfers Assistance  Needed: Ambulates with RW PTA. ADL's / Homemaking Assistance Needed: needs (A) from staff but able to ambulate to bathroom  Communication / Swallowing Assistance Needed: none      OT Diagnosis: Generalized weakness;Cognitive deficits   OT Problem List: Decreased strength;Decreased activity tolerance;Impaired balance (sitting and/or standing);Impaired vision/perception;Decreased coordination;Decreased cognition;Decreased safety awareness;Decreased knowledge of use of DME or AE;Decreased knowledge of precautions;Cardiopulmonary status limiting activity   OT Treatment/Interventions: Self-care/ADL training;Therapeutic exercise;Neuromuscular education;DME and/or AE instruction;Therapeutic activities;Cognitive remediation/compensation;Visual/perceptual remediation/compensation;Patient/family education;Balance training    OT Goals(Current goals can be found in the care plan section) Acute Rehab OT Goals Patient Stated Goal: per husband to get her back to baseline OT Goal Formulation: Patient unable to participate in goal setting Time For Goal Achievement: 01/02/16 Potential to Achieve Goals: Fair  OT Frequency: Min 2X/week   Barriers to D/C: Decreased caregiver support  ALF and must be able to ambulate       Co-evaluation              End of Session Nurse Communication: Mobility status;Precautions  Activity Tolerance: Patient tolerated treatment well Patient left: in bed;with call  bell/phone within reach;with bed alarm set;with family/visitor present   Time: 0912-0930 OT Time Calculation (min): 18 min Charges:  OT General Charges $OT Visit: 1 Procedure OT Evaluation $OT Eval Moderate Complexity: 1 Procedure G-Codes:    Parke Poisson B 2016/01/11, 3:25 PM   Jeri Modena   OTR/L Pager: [phone] Office: [phone] .

## 2015-12-19 NOTE — Progress Notes (Signed)
Patient with increased oxygen demand, is on 2L at SNF has been on 4L here and now up to 6L. Sats low 90s. Pt with no c/o SOB and no distress noted. Dr. Posey Pronto called and concerns expressed. Will cont to monitor pt.

## 2015-12-19 NOTE — Progress Notes (Signed)
[facility] Progress Note  Patient: Jocelyn Burgess [id number]   PCP: Bobetta Lime, MD DOB: [birth year]   DOA: 12/17/2015   DOS: 12/19/2015   Date of Service: the patient was seen and examined on 12/19/2015  Subjective: The patient is confused and is unable to provide a significant history. Reportedly has been in and out with her awareness and also has some visual hallucination nation, trying to grab things from the air. Nutrition: He remained nothing by mouth until last night  Brief hospital course: Pt. with PMH of PE, dementia, COPD, on 2 L at home, HTN, DM 2, breast cancer; admitted on 12/17/2015, with complaint of seizure followed by Todd's paralysis, was found to have seizure, CVA, possible pneumonia. Currently further plan is continue supportive management.  Assessment and Plan: 1. Seizure ([place]) EEG is positive for right temporal lobe seizure focus. CT head without any acute abnormality. Initially loaded with Dilantin currently pt will be loaded by fosphenytoin, since patient is having ongoing seizure activity per neurology. Also started on Vimpat as well as Dilantin 100 mg 3 times a day. Development in his discontinued. We will get MRI brain with and without contrast once stable. Monitor closely for aspiration event as well as recurrent seizures. Currently having long-term EEG monitoring. Prognosis is significantly guarded with encephalopathy, poor by mouth intake as well as pneumonia.  2. Acute left posterior frontal infarct 5 mm size. Patient on Xarelto at home. Currently on aspirin due to concern of GI bleed. Neurology following. Continue Lipitor. Continue NIHS. PTOT and speech consultation.  3. Aspiration pneumonia, healthcare associated. Acute on chronic hypoxic respiratory failure. COPD with chronic respiratory failure and 2 L of oxygen.  The patient is a resident of nursing home. Comes in with seizures as well as acute encephalopathy. Speech therapy recommends  dysphagia type I diet. CT chest with contrast shows evidence of possible pulmonary edema as well as right lower lobe consolidation and pleural effusion. No significant wheezing on examination. Continue duo nebs, added Mucinex, get chest x-ray. Low threshold to use BiPAP for respiratory distress. Initially patient was on Unasyn antibiotic changed to Zosyn.  4. Acute encephalopathy. Multifactorial in the setting of ongoing seizure activity as well as daily ADL with infection. Given that the patient is more hypoxic currently I will discontinue Aricept, reduce the dose of Seroquel from 50 mg twice a day to 50 mg daily, discontinue clonidine 0.1 mg twice a day and use hydralazine. The patient's blood pressure remains significantly elevated uric can use clonidine patch 0.1 mg.  5. Symptomatic anemia. Status post 1 PRBC on 12/18/2015 as well as Feraheme 1. GI consulted who recommended no acute intervention at present. Xarelto is currently on hold. H&H remain stable will continue to monitor.  6. CT of PE. On anticoagulation at home which is currently on hold due to hemoptysis and GI bleed concern. Continue aspirin. If H&H remain stable will start the patient on prophylactic due to prophylaxis on 12/20/2015.  7. Essential hypertension. Blood pressure initially was elevated currently allowing permissive hypertension in the setting of CVA. Clonidine twice a day is discontinued due to low blood pressure as well as psychotropic effect. Can use when necessary hydralazine. Can use clonidine patch of the blood pressure is significantly elevated.  8. Hypokalemia. Replacing.  9. Appendectomy diabetes mellitus. Continue sliding scale.  Pain management: And was on morphine which I would discontinue, continue Tylenol when necessary Activity: SNF as per physical therapy Bowel regimen: last BM 12/18/2015 Diet: Dysphagia type I,  cardiac and carb modified diet.  DVT Prophylaxis: mechanical  compression device.  Advance goals of care discussion: DNR/DNI  Family Communication: no family was present at bedside, at the time of interview.   Disposition:  Discharge to SNF once stable. Expected discharge date: 12/24/2015  Consultants: Neurology, gastroenterology Procedures: EEG  Antibiotics: Anti-infectives    Start     Dose/Rate Route Frequency Ordered Stop   12/19/15 1800  piperacillin-tazobactam (ZOSYN) IVPB 3.375 g     3.375 g 12.5 mL/hr over 240 Minutes Intravenous Every 8 hours 12/19/15 1751     12/18/15 1000  Ampicillin-Sulbactam (UNASYN) 3 g in sodium chloride 0.9 % 100 mL IVPB  Status:  Discontinued     3 g 100 mL/hr over 60 Minutes Intravenous Every 8 hours 12/18/15 0952 12/19/15 1742        Intake/Output Summary (Last 24 hours) at 12/19/15 1808 Last data filed at 12/19/15 1416  Gross per 24 hour  Intake    540 ml  Output      0 ml  Net    540 ml   Filed Weights   12/18/15 1100  Weight: 66.1 kg (145 lb 11.6 oz)    Objective: Physical Exam: Filed Vitals:   12/19/15 0825 12/19/15 0903 12/19/15 1125 12/19/15 1440  BP:  171/65 139/58 107/51  Pulse:   65 60  Temp:   97.5 F (36.4 C) 97.8 F (36.6 C)  TempSrc:   Axillary Axillary  Resp:   16 13  Height:      Weight:      SpO2: 96%  93% 93%    General: Alert, Awake and Disoriented Time, Place and Person. Appear in moderate distress Eyes: PERRL, Conjunctiva normal ENT: Oral Mucosa clear moist. Neck: difficult to assess JVD, no Abnormal Mass Or lumps Cardiovascular: S1 and S2 Present, aortic systolic Murmur, Respiratory: Bilateral Air entry equal and Decreased, bilateral Crackles, no wheezes Abdomen: Bowel Sound present, Soft and no tenderness Skin: no redness, no Rash  Extremities: no Pedal edema, no calf tenderness Neurologic: Left-sided weakness, so the examination is difficult due to confusion  Data Reviewed: CBC:  Recent Labs Lab 12/17/15 1811  12/18/15 0917 12/18/15 2228  12/19/15 0201 12/19/15 0539 12/19/15 0954  WBC 9.1  --  7.5  --   --  8.3  --   NEUTROABS 7.3  --   --   --   --   --   --   HGB 6.5*  < > 7.8* 9.1* 8.8* 9.3* 9.1*  HCT 23.1*  < > 26.2* 29.7* 28.9* 30.8* 29.6*  MCV 73.3*  --  75.1*  --   --  76.4*  --   PLT 452*  --  455*  --   --  365  --   < > = values in this interval not displayed. Basic Metabolic Panel:  Recent Labs Lab 12/17/15 1811 12/17/15 1819 12/17/15 2220 12/18/15 0917 12/19/15 0539  NA 134* 137  --  135 136  K 2.5* 2.6*  --  3.4* 3.0*  CL 101 99*  --  102 95*  CO2 19*  --   --  25 26  GLUCOSE 269* 265*  --  140* 138*  BUN 13 13  --  12 7  CREATININE 0.76 0.50  --  0.58 0.71  CALCIUM 9.1  --   --  8.8* 8.9  MG  --   --  1.3*  --   --   PHOS  --   --  3.0  --   --     Liver Function Tests:  Recent Labs Lab 12/17/15 1811 12/18/15 0917 12/19/15 0539  AST 21 20 21   ALT 17 15 15   ALKPHOS 59 51 55  BILITOT 0.4 0.8 1.4*  PROT 7.3 6.8 6.7  ALBUMIN 3.4* 3.4* 3.3*    Recent Labs Lab 12/18/15 0917  LIPASE 18   No results for input(s): AMMONIA in the last 168 hours. Coagulation Profile:  Recent Labs Lab 12/17/15 1811 12/19/15 0539  INR 2.70* 1.33   Cardiac Enzymes:  Recent Labs Lab 12/17/15 2332 12/18/15 0501 12/18/15 0917 12/18/15 2228  TROPONINI 0.08* 0.06* 0.06* 0.04*   BNP (last 3 results) No results for input(s): PROBNP in the last 8760 hours.  CBG:  Recent Labs Lab 12/19/15 0349 12/19/15 0814 12/19/15 1122 12/19/15 1430 12/19/15 1649  GLUCAP 132* 135* 135* 97 125*    Studies: Ct Angio Head W Or Wo Contrast  12/19/2015  CLINICAL DATA:  82 year old female with evidence of the left posterior frontal lacunar infarct on limited MRI yesterday performed for seizure. Initial encounter. EXAM: CT ANGIOGRAPHY HEAD AND NECK TECHNIQUE: Multidetector CT imaging of the head and neck was performed using the standard protocol during bolus administration of intravenous contrast. Multiplanar CT  image reconstructions and MIPs were obtained to evaluate the vascular anatomy. Carotid stenosis measurements (when applicable) are obtained utilizing NASCET criteria, using the distal internal carotid diameter as the denominator. CONTRAST:  50 mL Isovue 370 COMPARISON:  Brain MRI 12/18/2015. Head CT without contrast 12/17/2015. Chest abdomen and pelvis CT 12/18/2015 FINDINGS: CT HEAD Brain: Stable noncontrast CT appearance of the brain. Confluent bilateral cerebral white matter hypodensity. No acute or evolving cortically based infarct is evident by CT. No acute intracranial hemorrhage identified. No midline shift, mass effect, or evidence of intracranial mass lesion. Calvarium and skull base:  No acute osseous abnormality identified. Paranasal sinuses: Stable, mild left sphenoid opacification. Orbits: Stable postoperative changes to the globes. Negative visualized scalp soft tissues. CTA NECK Skeleton: Osteopenia. Absent dentition. Moderate to severe T5 and T6 compression fractures (vertebra plana at T6) as seen on CT chest yesterday. Partially visible nonacute appearing posterior left seventh rib fracture. No new osseous abnormality identified. Other neck: Bilateral layering pleural effusions appear stable since yesterday. Pulmonary ground-glass opacity with septal thickening superimposed on evidence of centrilobular emphysema. Stable visualized mediastinal lymph nodes. Negative for age thyroid with left lobe nodules which do not meet consensus criteria for thyroid ultrasound follow-up. Larynx, pharynx, parapharyngeal spaces, retropharyngeal space, sublingual space, submandibular glands, parotid glands, and bilateral cervical lymph nodes are within normal limits. Aortic arch: Severe soft and calcified arch atherosclerosis with several areas of ulcerated plaque (series 501, images 19 and 14 and coronal series 504, image 140). Bovine type arch configuration. Right carotid system: No brachiocephalic artery or right  CCA origin stenosis despite atherosclerosis. Mildly tortuous right CCA. Negative for age right carotid bifurcation with mild atherosclerosis and no stenosis. Negative cervical right ICA. Left carotid system: Bovine type left CCA origin with no stenosis despite calcified plaque. Mildly tortuous proximal left CCA. Confluent calcified plaque at the left carotid bifurcation with less than 50 % stenosis with respect to the distal vessel. Left ICA bulb is widely patent. Negative cervical left ICA otherwise. Vertebral arteries: No proximal right subclavian artery stenosis despite atherosclerosis. Normal right vertebral artery origin. The right vertebral artery is mildly tortuous and mildly dominant, but otherwise normal to the skullbase. No proximal left subclavian artery stenosis despite atherosclerosis.  However, there is moderate to severe atherosclerotic stenosis at the left vertebral artery origin as seen on series 504, image 166. Mildly non dominant left vertebral artery otherwise is normal to the skullbase. CTA HEAD Posterior circulation: Dominant distal right vertebral artery with moderate V4 segment calcified plaque but only mild to moderate stenosis. No distal left vertebral artery stenosis. Normal left PICA origin. Normal vertebrobasilar junction. No basilar stenosis. Normal SCA and PCA origins. Right PCA branches are within normal limits. There is moderate stenosis of the left PCA P1 segment as seen on series 503, image 241. The left PCA branches otherwise appear normal. Posterior communicating arteries are diminutive or absent. Anterior circulation: Both ICA siphons are patent. There is mild to moderate bilateral siphon calcified plaque which does not appear hemodynamically significant on either side. Normal ophthalmic artery origins. Patent carotid termini. Dominant left ACA A1 segment, the right is diminutive. Anterior communicating artery and bilateral ACA branches are within normal limits. Left MCA M1  segment, bifurcation, and left MCA branches are normal. Right MCA M1 segment, trifurcation, and right MCA branches are normal. Venous sinuses: Patent. Anatomic variants: Dominant right vertebral artery and left ACA A1 segment. Delayed phase: No abnormal enhancement identified. IMPRESSION: 1. Negative for emergent large vessel occlusion or hemodynamically significant carotid or anterior circulation stenosis. 2. Positive for severe atherosclerosis of the aortic arch, including several areas of ulcerated plaque. 3. Positive for posterior circulation stenoses: - moderate to severe Left vertebral artery origin stenosis, - mild to moderate distal Right vertebral artery and proximal Left PCA stenoses. 4. Stable CT appearance of the brain. 5. Persistent small pleural effusions with suspected pulmonary edema superimposed on emphysema. Electronically Signed   By: Genevie Ann M.D.   On: 12/19/2015 13:51   Ct Angio Neck W Or Wo Contrast  12/19/2015  CLINICAL DATA:  82 year old female with evidence of the left posterior frontal lacunar infarct on limited MRI yesterday performed for seizure. Initial encounter. EXAM: CT ANGIOGRAPHY HEAD AND NECK TECHNIQUE: Multidetector CT imaging of the head and neck was performed using the standard protocol during bolus administration of intravenous contrast. Multiplanar CT image reconstructions and MIPs were obtained to evaluate the vascular anatomy. Carotid stenosis measurements (when applicable) are obtained utilizing NASCET criteria, using the distal internal carotid diameter as the denominator. CONTRAST:  50 mL Isovue 370 COMPARISON:  Brain MRI 12/18/2015. Head CT without contrast 12/17/2015. Chest abdomen and pelvis CT 12/18/2015 FINDINGS: CT HEAD Brain: Stable noncontrast CT appearance of the brain. Confluent bilateral cerebral white matter hypodensity. No acute or evolving cortically based infarct is evident by CT. No acute intracranial hemorrhage identified. No midline shift, mass effect,  or evidence of intracranial mass lesion. Calvarium and skull base:  No acute osseous abnormality identified. Paranasal sinuses: Stable, mild left sphenoid opacification. Orbits: Stable postoperative changes to the globes. Negative visualized scalp soft tissues. CTA NECK Skeleton: Osteopenia. Absent dentition. Moderate to severe T5 and T6 compression fractures (vertebra plana at T6) as seen on CT chest yesterday. Partially visible nonacute appearing posterior left seventh rib fracture. No new osseous abnormality identified. Other neck: Bilateral layering pleural effusions appear stable since yesterday. Pulmonary ground-glass opacity with septal thickening superimposed on evidence of centrilobular emphysema. Stable visualized mediastinal lymph nodes. Negative for age thyroid with left lobe nodules which do not meet consensus criteria for thyroid ultrasound follow-up. Larynx, pharynx, parapharyngeal spaces, retropharyngeal space, sublingual space, submandibular glands, parotid glands, and bilateral cervical lymph nodes are within normal limits. Aortic arch: Severe soft and calcified  arch atherosclerosis with several areas of ulcerated plaque (series 501, images 19 and 14 and coronal series 504, image 140). Bovine type arch configuration. Right carotid system: No brachiocephalic artery or right CCA origin stenosis despite atherosclerosis. Mildly tortuous right CCA. Negative for age right carotid bifurcation with mild atherosclerosis and no stenosis. Negative cervical right ICA. Left carotid system: Bovine type left CCA origin with no stenosis despite calcified plaque. Mildly tortuous proximal left CCA. Confluent calcified plaque at the left carotid bifurcation with less than 50 % stenosis with respect to the distal vessel. Left ICA bulb is widely patent. Negative cervical left ICA otherwise. Vertebral arteries: No proximal right subclavian artery stenosis despite atherosclerosis. Normal right vertebral artery origin.  The right vertebral artery is mildly tortuous and mildly dominant, but otherwise normal to the skullbase. No proximal left subclavian artery stenosis despite atherosclerosis. However, there is moderate to severe atherosclerotic stenosis at the left vertebral artery origin as seen on series 504, image 166. Mildly non dominant left vertebral artery otherwise is normal to the skullbase. CTA HEAD Posterior circulation: Dominant distal right vertebral artery with moderate V4 segment calcified plaque but only mild to moderate stenosis. No distal left vertebral artery stenosis. Normal left PICA origin. Normal vertebrobasilar junction. No basilar stenosis. Normal SCA and PCA origins. Right PCA branches are within normal limits. There is moderate stenosis of the left PCA P1 segment as seen on series 503, image 241. The left PCA branches otherwise appear normal. Posterior communicating arteries are diminutive or absent. Anterior circulation: Both ICA siphons are patent. There is mild to moderate bilateral siphon calcified plaque which does not appear hemodynamically significant on either side. Normal ophthalmic artery origins. Patent carotid termini. Dominant left ACA A1 segment, the right is diminutive. Anterior communicating artery and bilateral ACA branches are within normal limits. Left MCA M1 segment, bifurcation, and left MCA branches are normal. Right MCA M1 segment, trifurcation, and right MCA branches are normal. Venous sinuses: Patent. Anatomic variants: Dominant right vertebral artery and left ACA A1 segment. Delayed phase: No abnormal enhancement identified. IMPRESSION: 1. Negative for emergent large vessel occlusion or hemodynamically significant carotid or anterior circulation stenosis. 2. Positive for severe atherosclerosis of the aortic arch, including several areas of ulcerated plaque. 3. Positive for posterior circulation stenoses: - moderate to severe Left vertebral artery origin stenosis, - mild to  moderate distal Right vertebral artery and proximal Left PCA stenoses. 4. Stable CT appearance of the brain. 5. Persistent small pleural effusions with suspected pulmonary edema superimposed on emphysema. Electronically Signed   By: Genevie Ann M.D.   On: 12/19/2015 13:51   Ct Chest W Contrast  12/18/2015  CLINICAL DATA:  Abnormal chest radiograph with edema and effusions. Right hilar mass suggested. EXAM: CT CHEST WITH CONTRAST TECHNIQUE: Multidetector CT imaging of the chest was performed during intravenous contrast administration. CONTRAST:  154mL ISOVUE-300 IOPAMIDOL (ISOVUE-300) INJECTION 61% COMPARISON:  Chest radiography same day and multiple previous. Chest CT 11/04/2015. Chest CT 07/23/2015. FINDINGS: The patient has developed bilateral pleural effusions layering dependently, larger on the right than the left, with dependent pulmonary atelectasis. There is near complete collapse of the right lower lobe. There is interstitial prominence diffusely is well some early alveolar density most consistent with interstitial and alveolar edema. Multiple spiculated densities in the right upper lobe as seen on the study of 11/04/2015 appear quite similar. These are not progressive. Some of them actually have retracted somewhat. Measured in the same location, the dominant focus which previously measured  24 x 26 mm now measures 23 x 20 mm. There continue to be enlarged right hilar and infrahilar nodes, more difficult to measure because of the right lower lobe collapse. These appear similar. The right lower lobe bronchus is filled with either mucous, pus or tumor. Abnormal density posteriorly in the left lower lobe has also gotten smaller since the previous study. Measurements today are approximately 2.9 x 3.2 cm as opposed to 6 x 4.6 cm previously. Certainly, the fact that these areas are shrinking suggests that they could be due to inflammatory disease. Pretracheal lymph node on image 21 is larger, measuring maximally 19  mm as opposed to 12 mm previously. Compression fractures at T12, T5 and T6 have not progressed. No new fractures are seen. IMPRESSION: Development of bilateral pleural effusions right larger than left with dependent atelectasis bilaterally and near complete collapse of the right lower lobe. Interstitial and alveolar pattern suggesting fluid overload/ pulmonary edema diffusely. Multiple spiculated densities in the lower lobes have done smaller/retracted somewhat since the study of May. This favors inflammatory disease. Tumor hidden amongst this is not excluded. Right hilar lymphadenopathy persists, difficult to measure accurately because of the right lower lobe collapse. Note that the right lower lobe bronchus is now full of either mucous, pus or tumor. Enlargement of a pretracheal lymph node image 21. Previously this measured 12 mm. Today it measured 19 mm. Electronically Signed   By: Nelson Chimes M.D.   On: 12/18/2015 22:58   Ct Abdomen Pelvis W Contrast  12/18/2015  CLINICAL DATA:  Generalized and upper abdominal pain. Blood in stool. Lung nodule. Abnormal chest radiograph. EXAM: CT ABDOMEN AND PELVIS WITH CONTRAST TECHNIQUE: Multidetector CT imaging of the abdomen and pelvis was performed using the standard protocol following bolus administration of intravenous contrast. CONTRAST:  166mL ISOVUE-300 IOPAMIDOL (ISOVUE-300) INJECTION 61% COMPARISON:  07/21/2015 FINDINGS: The liver has a normal appearance without focal lesions. No calcified gallstones. The spleen is normal. The pancreas is normal. The adrenal glands may show mild hyperplasia but there is no focal lesion. The kidneys are normal. There is aortic atherosclerosis but no aneurysm. The IVC is normal. No retroperitoneal mass or lymphadenopathy. No free intraperitoneal fluid or air. Bladder appears normal. Uterus and adnexal regions are normal. No bowel pathology. Chronic degenerative changes affect the spine. There is compression fracture at T12 which is  new since February but was present on a CT chest of 11/04/2015. IMPRESSION: No significant finding in the abdomen or pelvis of an acute nature. Aortic atherosclerosis. Previously seen T12 fracture which has not progressed. Electronically Signed   By: Nelson Chimes M.D.   On: 12/18/2015 22:38     Scheduled Meds: . antiseptic oral rinse  7 mL Mouth Rinse BID  . aspirin EC  325 mg Oral Daily  . atorvastatin  40 mg Oral q1800  . docusate sodium  100 mg Oral BID  . famotidine (PEPCID) IV  20 mg Intravenous Q12H  . guaiFENesin  5 mL Oral TID PC & HS  . insulin aspart  0-15 Units Subcutaneous TID WC  . insulin aspart  0-5 Units Subcutaneous QHS  . ipratropium  0.5 mg Nebulization BID  . lacosamide  100 mg Oral BID  . levalbuterol  1.25 mg Nebulization BID  . montelukast  10 mg Oral QHS  . pantoprazole  40 mg Oral BID AC  . phenytoin  100 mg Oral TID  . piperacillin-tazobactam (ZOSYN)  IV  3.375 g Intravenous Q8H  . potassium chloride  40 mEq Oral BID  . [START ON 12/20/2015] QUEtiapine  50 mg Oral QHS  . sertraline  100 mg Oral Daily   Continuous Infusions:  PRN Meds: acetaminophen **OR** acetaminophen, food thickener, hydrALAZINE, LORazepam  Time spent: 30 minutes  Author: Berle Mull, MD [facility] Pager: [phone] 12/19/2015 6:08 PM  If 7PM-7AM, please contact night-coverage at [email or web address], password [email or web address]

## 2015-12-20 ENCOUNTER — Inpatient Hospital Stay (HOSPITAL_COMMUNITY): Payer: Medicare Other

## 2015-12-20 DIAGNOSIS — I2699 Other pulmonary embolism without acute cor pulmonale: Secondary | ICD-10-CM

## 2015-12-20 DIAGNOSIS — I63011 Cerebral infarction due to thrombosis of right vertebral artery: Secondary | ICD-10-CM

## 2015-12-20 DIAGNOSIS — J9601 Acute respiratory failure with hypoxia: Secondary | ICD-10-CM | POA: Insufficient documentation

## 2015-12-20 LAB — CBC WITH DIFFERENTIAL/PLATELET
BASOS ABS: 0 10*3/uL (ref 0.0–0.1)
BASOS PCT: 0 %
EOS ABS: 0.1 10*3/uL (ref 0.0–0.7)
EOS PCT: 1 %
HEMATOCRIT: 32.3 % — AB (ref 36.0–46.0)
Hemoglobin: 9.6 g/dL — ABNORMAL LOW (ref 12.0–15.0)
Lymphocytes Relative: 11 %
Lymphs Abs: 1.2 10*3/uL (ref 0.7–4.0)
MCH: 22.7 pg — ABNORMAL LOW (ref 26.0–34.0)
MCHC: 29.7 g/dL — ABNORMAL LOW (ref 30.0–36.0)
MCV: 76.5 fL — ABNORMAL LOW (ref 78.0–100.0)
MONO ABS: 1 10*3/uL (ref 0.1–1.0)
Monocytes Relative: 10 %
NEUTROS ABS: 8.1 10*3/uL — AB (ref 1.7–7.7)
Neutrophils Relative %: 78 %
PLATELETS: 450 10*3/uL — AB (ref 150–400)
RBC: 4.22 MIL/uL (ref 3.87–5.11)
RDW: 19.2 % — AB (ref 11.5–15.5)
WBC: 10.4 10*3/uL (ref 4.0–10.5)

## 2015-12-20 LAB — COMPREHENSIVE METABOLIC PANEL
ALBUMIN: 3.2 g/dL — AB (ref 3.5–5.0)
ALK PHOS: 61 U/L (ref 38–126)
ALT: 13 U/L — AB (ref 14–54)
ANION GAP: 10 (ref 5–15)
AST: 18 U/L (ref 15–41)
BILIRUBIN TOTAL: 0.7 mg/dL (ref 0.3–1.2)
BUN: 7 mg/dL (ref 6–20)
CALCIUM: 9.3 mg/dL (ref 8.9–10.3)
CO2: 26 mmol/L (ref 22–32)
Chloride: 100 mmol/L — ABNORMAL LOW (ref 101–111)
Creatinine, Ser: 0.6 mg/dL (ref 0.44–1.00)
GFR calc Af Amer: >60 mL/min (ref >60)
GFR calc non Af Amer: >60 mL/min (ref >60)
GLUCOSE: 254 mg/dL — AB (ref 65–99)
Potassium: 3 mmol/L — ABNORMAL LOW (ref 3.5–5.1)
SODIUM: 136 mmol/L (ref 135–145)
Total Protein: 6.6 g/dL (ref 6.5–8.1)

## 2015-12-20 LAB — GLUCOSE, CAPILLARY
GLUCOSE-CAPILLARY: 273 mg/dL — AB (ref 65–99)
GLUCOSE-CAPILLARY: 280 mg/dL — AB (ref 65–99)
Glucose-Capillary: 195 mg/dL — ABNORMAL HIGH (ref 65–99)
Glucose-Capillary: 94 mg/dL (ref 65–99)

## 2015-12-20 LAB — HEMOGLOBIN AND HEMATOCRIT, BLOOD
HEMATOCRIT: 32 % — AB (ref 36.0–46.0)
Hemoglobin: 9.6 g/dL — ABNORMAL LOW (ref 12.0–15.0)

## 2015-12-20 LAB — MAGNESIUM: Magnesium: 1.5 mg/dL — ABNORMAL LOW (ref 1.7–2.4)

## 2015-12-20 LAB — PROCALCITONIN: Procalcitonin: 0.12 ng/mL

## 2015-12-20 LAB — PHENYTOIN LEVEL, TOTAL: PHENYTOIN LVL: 15 ug/mL (ref 10.0–20.0)

## 2015-12-20 LAB — PROTIME-INR
INR: 1.32 (ref 0.00–1.49)
Prothrombin Time: 16.5 seconds — ABNORMAL HIGH (ref 11.6–15.2)

## 2015-12-20 MED ORDER — MAGNESIUM SULFATE 2 GM/50ML IV SOLN
2.0000 g | Freq: Once | INTRAVENOUS | Status: AC
  Administered 2015-12-20: 2 g via INTRAVENOUS
  Filled 2015-12-20: qty 50

## 2015-12-20 MED ORDER — ENOXAPARIN SODIUM 40 MG/0.4ML ~~LOC~~ SOLN
40.0000 mg | SUBCUTANEOUS | Status: DC
  Administered 2015-12-20 – 2015-12-22 (×3): 40 mg via SUBCUTANEOUS
  Filled 2015-12-20 (×3): qty 0.4

## 2015-12-20 MED ORDER — FAMOTIDINE 40 MG/5ML PO SUSR
20.0000 mg | Freq: Two times a day (BID) | ORAL | Status: DC
  Administered 2015-12-20 – 2015-12-23 (×7): 20 mg via ORAL
  Filled 2015-12-20 (×7): qty 2.5

## 2015-12-20 MED ORDER — FUROSEMIDE 10 MG/ML IJ SOLN
20.0000 mg | Freq: Once | INTRAMUSCULAR | Status: AC
  Administered 2015-12-20: 20 mg via INTRAVENOUS
  Filled 2015-12-20: qty 2

## 2015-12-20 MED ORDER — LACOSAMIDE 50 MG PO TABS
200.0000 mg | ORAL_TABLET | Freq: Two times a day (BID) | ORAL | Status: DC
  Administered 2015-12-20 – 2015-12-23 (×6): 200 mg via ORAL
  Filled 2015-12-20 (×6): qty 4

## 2015-12-20 NOTE — Care Management Important Message (Signed)
Important Message  Patient Details  Name: Jocelyn Burgess MRN: [id number] Date of Birth: 11-04-1931   Medicare Important Message Given:  Yes    Loann Quill 12/20/2015, 10:32 AM

## 2015-12-20 NOTE — Progress Notes (Addendum)
Initial Nutrition Assessment  DOCUMENTATION CODES:   Severe malnutrition in context of chronic illness  INTERVENTION:    Mighty Shake II TID with meals, each supplement provides 480-500 kcals and 20-23 grams of protein  NUTRITION DIAGNOSIS:   Malnutrition related to chronic illness as evidenced by severe depletion of muscle mass, percent weight loss (20% in the past 6 months).  GOAL:   Patient will meet greater than or equal to 90% of their needs  MONITOR:   PO intake, Supplement acceptance, Labs, Weight trends, I & O's  REASON FOR ASSESSMENT:   Low Braden    ASSESSMENT:   82 y.o. Female with hx of pulmonary embolism, dementia, COPD, HTN, DM2, breast cancer. She presented with seizure event witnessed by other caretakers.   Labs reviewed potassium low. CBG's: [phone] Medications reviewed and include Colace, KCl. Nutrition-Focused physical exam completed. Findings are mild-moderate fat depletion, severe muscle depletion, and no edema.  Patient with 20% weight loss over the past 6 months.  Patient with severe PCM. Unable to retrieve much nutrition information from family member (? Husband) in room with her.  Per discussion with RN, patient likes vanilla pudding, not chocolate. She has been taking medications with pudding. PO intake of meals has been variable, she consumed none of breakfast, but ate all of lunch.  Diet Order:  DIET - DYS 1 Room service appropriate?: Yes; Fluid consistency:: Nectar Thick  Skin:  Reviewed, no issues  Last BM:  7/4  Height:   Ht Readings from Last 1 Encounters:  12/18/15 5\' 7"  (1.702 m)    Weight:   Wt Readings from Last 1 Encounters:  12/18/15 145 lb 11.6 oz (66.1 kg)    Ideal Body Weight:  61.4 kg  BMI:  Body mass index is 22.82 kg/(m^2).  Estimated Nutritional Needs:   Kcal:  1600-1800  Protein:  80-90 gm  Fluid:  1.6-1.8 L  EDUCATION NEEDS:   No education needs identified at this time  Molli Barrows,  [place], [place], [place] Pager [phone] After Hours Pager [phone]

## 2015-12-20 NOTE — Progress Notes (Signed)
STROKE TEAM PROGRESS NOTE   SUBJECTIVE (INTERVAL HISTORY) No family is at the bedside. She is lying in the bed, awake, mittens on both hands. Seizure pads on bed. Able to speak in short sentence but very confused. L side movement much improved today compared to yesterday. LTM EEG is still on.    OBJECTIVE Temp:  [97.1 F (36.2 C)-98.2 F (36.8 C)] 98.2 F (36.8 C) (07/06 0755) Pulse Rate:  [60-94] 89 (07/06 0816) Cardiac Rhythm:  [-] Normal sinus rhythm (07/06 0806) Resp:  [13-23] 20 (07/06 0816) BP: (86-165)/(45-94) 138/79 mmHg (07/06 0816) SpO2:  [92 %-98 %] 98 % (07/06 0913)   Recent Labs Lab 12/19/15 1122 12/19/15 1430 12/19/15 1649 12/19/15 2131 12/20/15 0750  GLUCAP 135* 97 125* 242* 195*    Recent Labs Lab 12/17/15 1811 12/17/15 1819 12/17/15 2220 12/18/15 0917 12/19/15 0539 12/20/15 0130  NA 134* 137  --  135 136 136  K 2.5* 2.6*  --  3.4* 3.0* 3.0*  CL 101 99*  --  102 95* 100*  CO2 19*  --   --  25 26 26   GLUCOSE 269* 265*  --  140* 138* 254*  BUN 13 13  --  12 7 7   CREATININE 0.76 0.50  --  0.58 0.71 0.60  CALCIUM 9.1  --   --  8.8* 8.9 9.3  MG  --   --  1.3*  --   --  1.5*  PHOS  --   --  3.0  --   --   --     Recent Labs Lab 12/17/15 1811 12/18/15 0917 12/19/15 0539 12/20/15 0130  AST 21 20 21 18   ALT 17 15 15  13*  ALKPHOS 59 51 55 61  BILITOT 0.4 0.8 1.4* 0.7  PROT 7.3 6.8 6.7 6.6  ALBUMIN 3.4* 3.4* 3.3* 3.2*    Recent Labs Lab 12/17/15 1811  12/18/15 0917  12/19/15 0201 12/19/15 0539 12/19/15 0954 12/19/15 1830 12/20/15 0130  WBC 9.1  --  7.5  --   --  8.3  --   --  10.4  NEUTROABS 7.3  --   --   --   --   --   --   --  8.1*  HGB 6.5*  < > 7.8*  < > 8.8* 9.3* 9.1* 9.1* 9.6*  9.6*  HCT 23.1*  < > 26.2*  < > 28.9* 30.8* 29.6* 29.8* 32.3*  32.0*  MCV 73.3*  --  75.1*  --   --  76.4*  --   --  76.5*  PLT 452*  --  455*  --   --  365  --   --  450*  < > = values in this interval not displayed.  Recent Labs Lab 12/17/15 2332  12/18/15 0501 12/18/15 0917 12/18/15 2228  TROPONINI 0.08* 0.06* 0.06* 0.04*    Recent Labs  12/17/15 1811 12/19/15 0539 12/20/15 0130  LABPROT 28.3* 16.6* 16.5*  INR 2.70* 1.33 1.32    Recent Labs  12/17/15 2029 12/19/15 1827  COLORURINE YELLOW YELLOW  LABSPEC 1.013 1.046*  PHURINE 7.0 7.0  GLUCOSEU 100* NEGATIVE  HGBUR NEGATIVE NEGATIVE  BILIRUBINUR NEGATIVE SMALL*  KETONESUR 15* 15*  PROTEINUR NEGATIVE NEGATIVE  NITRITE NEGATIVE NEGATIVE  LEUKOCYTESUR NEGATIVE NEGATIVE       Component Value Date/Time   CHOL 103 12/18/2015 0501   TRIG 111 12/18/2015 0501   HDL 44 12/18/2015 0501   CHOLHDL 2.3 12/18/2015 0501   VLDL 22 12/18/2015  0501   LDLCALC 37 12/18/2015 0501   Lab Results  Component Value Date   HGBA1C 6.3* 12/18/2015      Component Value Date/Time   LABOPIA NONE DETECTED 12/17/2015 2028   LABOPIA POSITIVE* 08/26/2015 0514   COCAINSCRNUR NONE DETECTED 12/17/2015 2028   COCAINSCRNUR NONE DETECTED 08/26/2015 0514   LABBENZ NONE DETECTED 12/17/2015 2028   LABBENZ POSITIVE* 08/26/2015 0514   AMPHETMU NONE DETECTED 12/17/2015 2028   AMPHETMU NONE DETECTED 08/26/2015 0514   THCU NONE DETECTED 12/17/2015 2028   THCU NONE DETECTED 08/26/2015 0514   LABBARB NONE DETECTED 12/17/2015 2028   LABBARB NONE DETECTED 08/26/2015 0514     Recent Labs Lab 12/17/15 1811  ETH <5    I have personally reviewed the radiological images below and agree with the radiology interpretations.  CXR  12/19/2015 Enlargement of cardiac silhouette with pulmonary vascular Congestion. Persistent pulmonary edema with bibasilar atelectasis and small RIGHT pleural effusion. Aortic atherosclerosis.  Ct Chest W Contrast  12/18/2015  IMPRESSION: Development of bilateral pleural effusions right larger than left with dependent atelectasis bilaterally and near complete collapse of the right lower lobe. Interstitial and alveolar pattern suggesting fluid overload/ pulmonary edema  diffusely. Multiple spiculated densities in the lower lobes have done smaller/retracted somewhat since the study of May. This favors inflammatory disease. Tumor hidden amongst this is not excluded. Right hilar lymphadenopathy persists, difficult to measure accurately because of the right lower lobe collapse. Note that the right lower lobe bronchus is now full of either mucous, pus or tumor. Enlargement of a pretracheal lymph node image 21. Previously this measured 12 mm. Today it measured 19 mm. 8   Mr Brain Wo Contrast  12/18/2015 IMPRESSION: Prematurely truncated scan due to lack of patient cooperation. Acute LEFT posterior frontal cortical infarct, 5 mm in size. This appears nonhemorrhagic. Atrophy and small vessel disease.   Ct Abdomen Pelvis W Contrast  12/18/2015  IMPRESSION: No significant finding in the abdomen or pelvis of an acute nature. Aortic atherosclerosis. Previously seen T12 fracture which has not progressed.   Ct Head Code Stroke W/o Cm  12/17/2015 IMPRESSION: No acute intracranial abnormality. Atrophy, chronic microvascular disease.   EEG This is an abnormal EEG due to mild diffuse generalized slowing of the background. This is consistent with the patient's known history of dementia.  She had a single electrographic seizure that appears to start in the right temporal region with rapid generalization. This lasted about 50 seconds in total and was associated clinically with versive left head turn and unresponsiveness.   CTA head and neck 12/19/2015  1. Negative for emergent large vessel occlusion or hemodynamically significant carotid or anterior circulation stenosis.  2. Positive for severe atherosclerosis of the aortic arch, including several areas of ulcerated plaque. 3. Positive for posterior circulation stenoses:  - moderate to severe Left vertebral artery origin stenosis, - mild to moderate distal Right vertebral artery and proximal Left PCA stenoses. 4. Stable CT appearance of  the brain. 5. Persistent small pleural effusions with suspected pulmonary edema superimposed on emphysema.  2D Echocardiogram  - Left ventricle: The cavity size was normal. Systolic function was  normal. Wall motion was normal; there were no regional wall  motion abnormalities. Features are consistent with a pseudonormal  left ventricular filling pattern, with concomitant abnormal  relaxation and increased filling pressure (grade 2 diastolic  dysfunction). - Aortic valve: Trileaflet; mildly thickened, mildly calcified  leaflets. - Mitral valve: Calcified annulus. There was mild regurgitation. - Pulmonary arteries: PA  peak pressure: 75 mm Hg (S). Impressions: - The right ventricular systolic pressure was increased consistent  with severe pulmonary hypertension.  LTM EEG 12/20/15 - This is an abnormal EEG due to the presence of the following: 1) Generalized polymorphic delta slowing, suggesting severe encephalopathy; 2) Frequent focal epileptiform discharges in the right posterior region, suggesting risk of focal epileptogenicity.  LE venous doppler - There is no acute DVT or SVT noted in the bilateral lower extremities. There appears to be chronic DVT noted in the left femoral vein.   PHYSICAL EXAM General - Well nourished, well developed, in no apparent distress.  Ophthalmologic - fundi not visualized due to noncooperation.  Cardiovascular - Regular rate and rhythm with no murmur.  Neck - supple, no carotid bruits  Mental Status -  Sleepy but easily arousable, disorientated to self, place, people, age and time.  Confused, not following commands, paucity of speech, able to say simple words or short sentences, no dysarthria. Naming and repetition not cooperative.   Cranial Nerves II - XII - II - inconsistently blinking to visual threat bilaterally III, IV, VI - Extraocular movements intact. V - Facial sensation intact bilaterally. VII - Facial movement intact  bilaterally VIII - Hearing & vestibular intact bilaterally. X - Palate elevates symmetrically. XI - Chin turning & shoulder shrug intact bilaterally. XII - Tongue protrusion intact.  Motor Strength - The patient's strength was symmetrical b/l UEs at least 4/5 and 3/5 RLE and 2/5 LLE.  Bulk was normal and fasciculations were absent.   Motor Tone - Muscle tone was assessed at the neck and appendages and was normal.  Reflexes - The patient's reflexes were symmetrical in all extremities and she no babinski.  Sensory - not cooperative.    Coordination - mild action tremor at LUE, no asterixis seen today.  Gait and Station - not tested.   ASSESSMENT/PLAN Jocelyn Burgess is a 82 y.o. female with history of DM, COPD, HTN, HLD, depression, dementia, PE in 07/2015 on Xarelto but currently on hold due to anemia admitted for GTC seizure. she has continued left UE and LE weakness, and EEG showed seizure coming out from right temporal region. MRI showed left CR/SO small infarct which is incidental finding and not correlating to her symptoms.    Seizure with prolonged Todd's paralysis   MRI - not tolerating for seizure work up   CT without contrast negative  EEG - seizure originating from right temporal lobe  Continue LTM EEG for now  LTM EEG - frequent right posterior epileptiform discharges  Dilantin corrected level 20, therapeutic  Daily dilantin level  Increase vimpat to 200mg  bid   Pt will need MRI with and without contrast if able to tolerate MRI  Consider LP if develop fever or worsening neuro status  Stroke: incidental left frontal subcortical (CR/SO) infarct - likely due to small vessel disease, not correlating to current symptoms.   MRI left small CR/SO infarct  CTA head and neck - no large vessel occlusion  2D Echo  Severe pulmonary hypertension  LE venous doppler no acute DVT but left old DVT  LDL 37  HgbA1c 6.3  SCDs for VTE prophylaxis  DIET - DYS 1 Room  service appropriate?: Yes; Fluid consistency:: Nectar Thick   Xarelto (rivaroxaban) daily prior to admission, now on aspirin 325 mg daily. Pt has severe anemia requiring blood transfusion. From stroke standpoint, Xarelto is likely not needed as the infarct likely due to small vessel disease. Also Xarelto may  put pt on high risk of bleeding, further worsen anemia.   Ongoing aggressive stroke risk factor management  Therapy recommendations:  pending  Disposition:  Pending  Anemia  Hb 6.5 -> 9.3-> 9.6  S/p blood transfusion  FOB positive  Xarelto discontinued due to anemia needing blood transfusion and FOB positive  B/l pleural effusion and RLL atelectasis   Underlying tumor not excluded  Management as per primary team  On unasyn  May need tumor work up once stabilized  History of PE on Xarelto  PE in 07/2015 on Xarelto since  LE venous doppler no acute DVT but old left DVT  Xarelto discontinued due to anemia requiring transfusionPt had 4-5 months of Xarelto already, may consider d/c Xarelto anyway given no acute DVT this time.  Pt already had 4-5 months of Xarelto, may consider d/c Xarelto anyway given no acute DVT this time.  Diabetes  HgbA1c 6.3 goal < 7.0  Controlled  Hypertension  Home meds:   clonidine Permissive hypertension and gradually normalized within 5-7 days. Currently on clonidine  Stable  Hyperlipidemia  Home meds:  lipitor    Currently on lipitor  LDL 37, goal < 70  Continue statin at discharge  Other Stroke Risk Factors  Advanced age  Other Active Problems  Dementia on aricept and seroquel. seroquel decreased to q hs yesterdya.  Depression on zoloft  Other Pertinent History  COPD  ? Cancer history  Hospital day # 3  The patient continues to have abnormal EEG with frequent epileptiform discharges. Dilantin level therapeutic and increased vimpat dose. Pt left sided weakness improved from yesterday. She is at risk for status  epilepticus, recurrent strokes and neurological worsening. She needs ongoing seizure control, monitoring and stroke work up and aggressive risk factor modification.  Rosalin Hawking, MD PhD Stroke Neurology 12/20/2015 9:43 AM    To contact Stroke Continuity provider, please refer to [email or web address] After hours, contact General Neurology

## 2015-12-20 NOTE — Clinical Social Work Placement (Signed)
   CLINICAL SOCIAL WORK PLACEMENT  NOTE  Date:  12/20/2015  Patient Details  Name: Jocelyn Burgess MRN: [id number] Date of Birth: [birth year]  Clinical Social Work is seeking post-discharge placement for this patient at the [place] level of care (*CSW will initial, date and re-position this form in  chart as items are completed):  Yes   Patient/family provided with [facility] Work Department's list of facilities offering this level of care within the geographic area requested by the patient (or if unable, by the patient's family).  Yes   Patient/family informed of their freedom to choose among providers that offer the needed level of care, that participate in Medicare, Medicaid or managed care program needed by the patient, have an available bed and are willing to accept the patient.  Yes   Patient/family informed of [facility]'s ownership interest in [facility] and [facility], as well as of the fact that they are under no obligation to receive care at these facilities.  PASRR submitted to EDS on 12/20/15     PASRR number received on       Existing PASRR number confirmed on 12/20/15     FL2 transmitted to all facilities in geographic area requested by pt/family on 12/20/15     FL2 transmitted to all facilities within larger geographic area on       Patient informed that his/her managed care company has contracts with or will negotiate with certain facilities, including the following:            Patient/family informed of bed offers received.  Patient chooses bed at       Physician recommends and patient chooses bed at      Patient to be transferred to   on  .  Patient to be transferred to facility by       Patient family notified on   of transfer.  Name of family member notified:        PHYSICIAN Please sign DNR     Additional Comment:    _______________________________________________ Candie Chroman, LCSW 12/20/2015,  11:20 AM

## 2015-12-20 NOTE — NC FL2 (Signed)
[place] MEDICAID FL2 LEVEL OF CARE SCREENING TOOL     IDENTIFICATION  Patient Name: Jocelyn Burgess Birthdate: 06/30/1931 Sex: female Admission Date (Current Location): 12/17/2015  [facility] and [place] Number:  Engineering geologist and Address:  [facility], [place] [address], [place], [place] [address]      Provider Number: [id number]  Attending Physician Name and Address:  Lavina Hamman, MD  Relative Name and Phone Number:       Current Level of Care: Hospital Recommended Level of Care: [place] Prior Approval Number:    Date Approved/Denied:   PASRR Number: [id number]  Discharge Plan: SNF    Current Diagnoses: Patient Active Problem List   Diagnosis Date Noted  . Acute and chronic respiratory failure with hypoxia ([place]) 12/19/2015  . HCAP (healthcare-associated pneumonia) 12/19/2015  . CVA (cerebral infarction) 12/19/2015  . PE (pulmonary embolism)   . Seizure ([place]) 12/17/2015  . Todd's paralysis (postepileptic) ([place]) 12/17/2015  . Symptomatic anemia 12/17/2015  . Occult blood positive stool 12/17/2015  . Pulmonary nodule 12/17/2015  . Left hemiplegia ([place]) 12/17/2015  . TIA (transient ischemic attack) 12/17/2015  . Pneumonia 11/04/2015  . Acute delirium 08/22/2015  . Sepsis ([place]) 08/21/2015  . Anxiety 08/21/2015  . UTI (lower urinary tract infection) 08/21/2015  . Dementia 08/08/2015  . Dyspnea on exertion 08/07/2015  . Vertebral fracture, osteoporotic ([place]) 08/07/2015  . Fracture of rib of left side with routine healing 08/07/2015  . AP (abdominal pain)   . Abdominal pain 07/21/2015  . Acute hyponatremia 07/21/2015  . Hypokalemia 07/21/2015  . Left hip pain 07/09/2015  . Benign essential tremor 07/09/2015  . Venous stasis dermatitis of both lower extremities 07/03/2015  . Bilateral edema of lower extremity 05/30/2015  . Bleeding nose 05/08/2015  . Morning headache 05/01/2015  . Intermittent confusion 04/17/2015   . Cough 04/17/2015  . Constipation, slow transit 04/09/2015  . Allergic rhinitis with postnasal drip 04/09/2015  . Hypertension goal BP (blood pressure) < 140/90 01/22/2015  . Major depressive disorder, recurrent, in full remission with anxious distress ([place]) 11/23/2014  . Heavy cigarette smoker 11/23/2014  . COPD, severe ([place]) 11/23/2014  . DD (diverticular disease) 11/23/2014  . Personal history of malignant neoplasm of breast 11/23/2014  . History of digestive disease 11/23/2014  . Personal history of healed traumatic fracture 11/23/2014  . HLD (hyperlipidemia) 11/23/2014  . Peripheral neuropathic pain ([place]) 11/23/2014  . At risk for falling 11/23/2014  . Diabetes mellitus type 2, controlled, with complications ([place]) [id number]    Orientation RESPIRATION BLADDER Height & Weight     Self  O2 (Nasal Canula 6 L) Incontinent Weight: 145 lb 11.6 oz (66.1 kg) Height:  5\' 7"  (170.2 cm)  BEHAVIORAL SYMPTOMS/MOOD NEUROLOGICAL BOWEL NUTRITION STATUS   (None) Convulsions/Seizures Incontinent Diet (DYS 1. Fluid nectar thick.)  AMBULATORY STATUS COMMUNICATION OF NEEDS Skin   Extensive Assist Verbally Other (Comment) (Amputation left breast. Ecchymosis bilateral arm.)                       Personal Care Assistance Level of Assistance  Bathing, Feeding, Dressing Bathing Assistance: Maximum assistance Feeding assistance: Maximum assistance Dressing Assistance: Maximum assistance     Functional Limitations Info  Sight, Hearing, Speech          SPECIAL CARE FACTORS FREQUENCY  PT (By licensed PT), OT (By licensed OT), Speech therapy, Restraints     PT Frequency: 5 x week OT Frequency:  5 x week     Speech Therapy Frequency: 5 x week Restraints Frequency: [place]    Contractures Contractures Info: Not present    Additional Factors Info  Code Status, Allergies Code Status Info: DNR Allergies Info: Ciprofloxacin, Codeine, Inhaler decongestatnt, Molds and smuts, Other,  Albuterol Sulfate           Current Medications (12/20/2015):  This is the current hospital active medication list Current Facility-Administered Medications  Medication Dose Route Frequency Provider Last Rate Last Dose  . acetaminophen (TYLENOL) tablet 650 mg  650 mg Oral Q4H PRN Toy Baker, MD       Or  . acetaminophen (TYLENOL) suppository 650 mg  650 mg Rectal Q4H PRN Toy Baker, MD   650 mg at 12/18/15 0840  . antiseptic oral rinse (CPC / CETYLPYRIDINIUM CHLORIDE 0.05%) solution 7 mL  7 mL Mouth Rinse BID Lavina Hamman, MD   7 mL at 12/20/15 0135  . aspirin EC tablet 325 mg  325 mg Oral Daily Rosalin Hawking, MD   325 mg at 12/20/15 1037  . atorvastatin (LIPITOR) tablet 40 mg  40 mg Oral q1800 Janece Canterbury, MD   40 mg at 12/19/15 1703  . docusate sodium (COLACE) capsule 100 mg  100 mg Oral BID Toy Baker, MD   100 mg at 12/20/15 1038  . famotidine (PEPCID) 40 MG/5ML suspension 20 mg  20 mg Oral BID Lavina Hamman, MD      . food thickener (THICK IT) powder   Oral PRN Lavina Hamman, MD      . guaiFENesin (ROBITUSSIN) 100 MG/5ML solution 100 mg  5 mL Oral TID PC & HS Lavina Hamman, MD   100 mg at 12/20/15 0811  . hydrALAZINE (APRESOLINE) injection 10 mg  10 mg Intravenous Q4H PRN Lavina Hamman, MD      . insulin aspart (novoLOG) injection 0-15 Units  0-15 Units Subcutaneous TID WC Lavina Hamman, MD   3 Units at 12/20/15 0810  . insulin aspart (novoLOG) injection 0-5 Units  0-5 Units Subcutaneous QHS Lavina Hamman, MD   2 Units at 12/19/15 2202  . ipratropium (ATROVENT) nebulizer solution 0.5 mg  0.5 mg Nebulization BID Lavina Hamman, MD   0.5 mg at 12/20/15 0909  . lacosamide (VIMPAT) tablet 100 mg  100 mg Oral BID Rosalin Hawking, MD   100 mg at 12/20/15 1037  . levalbuterol (XOPENEX) nebulizer solution 1.25 mg  1.25 mg Nebulization BID Lavina Hamman, MD   1.25 mg at 12/20/15 0909  . LORazepam (ATIVAN) injection 1 mg  1 mg Intravenous Q4H PRN Janece Canterbury, MD       . montelukast (SINGULAIR) tablet 10 mg  10 mg Oral QHS Toy Baker, MD   10 mg at 12/18/15 2139  . pantoprazole (PROTONIX) EC tablet 40 mg  40 mg Oral BID AC Janece Canterbury, MD   40 mg at 12/20/15 0811  . phenytoin (DILANTIN) ER capsule 100 mg  100 mg Oral TID Rosalin Hawking, MD   100 mg at 12/20/15 1037  . piperacillin-tazobactam (ZOSYN) IVPB 3.375 g  3.375 g Intravenous Q8H Lavina Hamman, MD   3.375 g at 12/20/15 1037  . potassium chloride 20 MEQ/15ML (10%) solution 40 mEq  40 mEq Oral BID Lavina Hamman, MD   40 mEq at 12/20/15 1038  . QUEtiapine (SEROQUEL) tablet 50 mg  50 mg Oral QHS Lavina Hamman, MD      . sertraline (  ZOLOFT) tablet 100 mg  100 mg Oral Daily Toy Baker, MD   100 mg at 12/20/15 1038     Discharge Medications: Please see discharge summary for a list of discharge medications.  Relevant Imaging Results:  Relevant Lab Results:   Additional Information SS#: [id number]  Candie Chroman, LCSW

## 2015-12-20 NOTE — Progress Notes (Signed)
[facility] Progress Note  Patient: Jocelyn Burgess [id number]   PCP: Bobetta Lime, MD DOB: 1932/06/14   DOA: 12/17/2015   DOS: 12/20/2015   Date of Service: the patient was seen and examined on 12/20/2015  Subjective: patient continues to remain confused without any purposeful history. Husband was at bedside at time of my evaluation. Continues to have visual hallucination Nutrition: tolerating oral diet  Brief hospital course: Pt. with PMH of PE, dementia, COPD, on 2 L at home, HTN, DM 2, breast cancer; admitted on 12/17/2015, with complaint of seizure followed by Todd's paralysis, was found to have seizure, CVA, possible pneumonia. Currently further plan is continue supportive management.  Assessment and Plan: 1. Seizure ([place]) EEG is positive for right temporal lobe seizure focus. CT head without any acute abnormality. Initially loaded with Dilantin currently pt will be loaded by fosphenytoin, since patient is having ongoing seizure activity per neurology. Also started on Vimpat as well as Dilantin 100 mg 3 times a day. We will get MRI brain with and without contrast once stable. Monitor closely for aspiration event as well as recurrent seizures. Currently having long-term EEG monitoring. Prognosis is significantly guarded with encephalopathy, poor by mouth intake as well as pneumonia.  2. Acute left posterior frontal infarct 5 mm size. Patient on Xarelto at home for DVT which was 3 months ago. Currently on aspirin due to concern of GI bleed. Neurology following. Continue Lipitor. Continue NIHS. PTOT and speech consultation.  3. Aspiration pneumonia, healthcare associated. Acute on chronic hypoxic respiratory failure. COPD with chronic respiratory failure and 2 L of oxygen.  The patient is a resident of nursing home. Comes in with seizures as well as acute encephalopathy. Speech therapy recommends dysphagia type I diet. CT chest with contrast shows evidence of possible  pulmonary edema as well as right lower lobe consolidation and pleural effusion. No significant wheezing on examination. Continue duo nebs, added Mucinex,add chest PT Low threshold to use BiPAP for respiratory distress. Initially patient was on Unasyn antibiotic changed to Zosyn. We given a milligram of IV Lasix due to x-ray showing evidence of pulmonary congestion.  4. Acute encephalopathy. Multifactorial in the setting of ongoing seizure activity as well as daily ADL with infection. Given that the patient is more hypoxic currently I will discontinue Aricept, reduce the dose of Seroquel from 50 mg twice a day to 50 mg daily, discontinue clonidine 0.1 mg twice a day and use hydralazine. If patient's blood pressure remains significantly elevated, can use clonidine patch 0.1 mg.  5. Symptomatic anemia. Status post 1 PRBC on 12/18/2015 as well as Feraheme 1. GI consulted who recommended no acute intervention at present. Xarelto is currently on hold. H&H remain stable will continue to monitor.  6. History of pulmonary embolism, chronic DVT on vascular Doppler On anticoagulation at home which is currently on hold due to hemoptysis and GI bleed concern. Continue aspirin. Resume DVT prophylaxis on 12/20/2015.  7. Essential hypertension. Blood pressure initially was elevated currently allowing permissive hypertension in the setting of CVA. Clonidine twice a day is discontinued due to low blood pressure as well as psychotropic effect. Can use when necessary hydralazine. Can use clonidine patch of the blood pressure is significantly elevated., give IV Lasix 20 mg 1.  8. Hypokalemia. Replacing.  9. diabetes mellitus. Continue sliding scale.  10.goals of care discussion. I discussed with patient's husband regarding patient's poor prognosis given her acute encephalopathy as well as ongoing dysphagia and a possible aspiration pneumonia requiring significant  amount of oxygen. Husband is  hopeful for improvement in patient's condition once a seizure is better. We'll continue to monitor the patient's course over the weekend.  Pain management: And was on morphine which I would discontinue, continue Tylenol when necessary Activity: SNF as per physical therapy Bowel regimen: last BM 12/18/2015 Diet: Dysphagia type I, cardiac and carb modified diet.  DVT Prophylaxis: subcutaneous heparin  Advance goals of care discussion: DNR/DNI  Family Communication: no family was present at bedside, at the time of interview.   Disposition:  Discharge to SNF once stable. Expected discharge date: 12/24/2015  Consultants: Neurology, gastroenterology Procedures: EEG  Antibiotics: Anti-infectives    Start     Dose/Rate Route Frequency Ordered Stop   12/19/15 1800  piperacillin-tazobactam (ZOSYN) IVPB 3.375 g     3.375 g 12.5 mL/hr over 240 Minutes Intravenous Every 8 hours 12/19/15 1751     12/18/15 1000  Ampicillin-Sulbactam (UNASYN) 3 g in sodium chloride 0.9 % 100 mL IVPB  Status:  Discontinued     3 g 100 mL/hr over 60 Minutes Intravenous Every 8 hours 12/18/15 0952 12/19/15 1742        Intake/Output Summary (Last 24 hours) at 12/20/15 1823 Last data filed at 12/20/15 1406  Gross per 24 hour  Intake    410 ml  Output    200 ml  Net    210 ml   Filed Weights   12/18/15 1100  Weight: 66.1 kg (145 lb 11.6 oz)    Objective: Physical Exam: Filed Vitals:   12/20/15 1425 12/20/15 1450 12/20/15 1526 12/20/15 1540  BP: 147/65 150/75 168/120 173/75  Pulse: 105 98 97 92  Temp: 98.2 F (36.8 C)     TempSrc: Axillary     Resp: 20 21 20 21   Height:      Weight:      SpO2: 94% 94% 93% 93%    General: Alert, Awake and Disoriented Time, Place and Person. Appear in moderate distress Eyes: PERRL, Conjunctiva normal ENT: Oral Mucosa clear moist. Neck: difficult to assess JVD, no Abnormal Mass Or lumps Cardiovascular: S1 and S2 Present, aortic systolic Murmur, Respiratory:  Bilateral Air entry equal and Decreased, bilateral Crackles, no wheezes Abdomen: Bowel Sound present, Soft and no tenderness Skin: no redness, no Rash  Extremities: no Pedal edema, no calf tenderness Neurologic: Left-sided weakness, so the examination is difficult due to confusion  Data Reviewed: CBC:  Recent Labs Lab 12/17/15 1811  12/18/15 0917  12/19/15 0201 12/19/15 0539 12/19/15 0954 12/19/15 1830 12/20/15 0130  WBC 9.1  --  7.5  --   --  8.3  --   --  10.4  NEUTROABS 7.3  --   --   --   --   --   --   --  8.1*  HGB 6.5*  < > 7.8*  < > 8.8* 9.3* 9.1* 9.1* 9.6*  9.6*  HCT 23.1*  < > 26.2*  < > 28.9* 30.8* 29.6* 29.8* 32.3*  32.0*  MCV 73.3*  --  75.1*  --   --  76.4*  --   --  76.5*  PLT 452*  --  455*  --   --  365  --   --  450*  < > = values in this interval not displayed. Basic Metabolic Panel:  Recent Labs Lab 12/17/15 1811 12/17/15 1819 12/17/15 2220 12/18/15 0917 12/19/15 0539 12/20/15 0130  NA 134* 137  --  135 136 136  K 2.5* 2.6*  --  3.4* 3.0* 3.0*  CL 101 99*  --  102 95* 100*  CO2 19*  --   --  25 26 26   GLUCOSE 269* 265*  --  140* 138* 254*  BUN 13 13  --  12 7 7   CREATININE 0.76 0.50  --  0.58 0.71 0.60  CALCIUM 9.1  --   --  8.8* 8.9 9.3  MG  --   --  1.3*  --   --  1.5*  PHOS  --   --  3.0  --   --   --     Liver Function Tests:  Recent Labs Lab 12/17/15 1811 12/18/15 0917 12/19/15 0539 12/20/15 0130  AST 21 20 21 18   ALT 17 15 15  13*  ALKPHOS 59 51 55 61  BILITOT 0.4 0.8 1.4* 0.7  PROT 7.3 6.8 6.7 6.6  ALBUMIN 3.4* 3.4* 3.3* 3.2*    Recent Labs Lab 12/18/15 0917  LIPASE 18   No results for input(s): AMMONIA in the last 168 hours. Coagulation Profile:  Recent Labs Lab 12/17/15 1811 12/19/15 0539 12/20/15 0130  INR 2.70* 1.33 1.32   Cardiac Enzymes:  Recent Labs Lab 12/17/15 2332 12/18/15 0501 12/18/15 0917 12/18/15 2228  TROPONINI 0.08* 0.06* 0.06* 0.04*   BNP (last 3 results) No results for input(s):  PROBNP in the last 8760 hours.  CBG:  Recent Labs Lab 12/19/15 1649 12/19/15 2131 12/20/15 0750 12/20/15 1147 12/20/15 1717  GLUCAP 125* 242* 195* 273* 280*    Studies: No results found.   Scheduled Meds: . antiseptic oral rinse  7 mL Mouth Rinse BID  . aspirin EC  325 mg Oral Daily  . atorvastatin  40 mg Oral q1800  . docusate sodium  100 mg Oral BID  . enoxaparin (LOVENOX) injection  40 mg Subcutaneous Q24H  . famotidine  20 mg Oral BID  . guaiFENesin  5 mL Oral TID PC & HS  . insulin aspart  0-15 Units Subcutaneous TID WC  . insulin aspart  0-5 Units Subcutaneous QHS  . ipratropium  0.5 mg Nebulization BID  . lacosamide  200 mg Oral BID  . levalbuterol  1.25 mg Nebulization BID  . montelukast  10 mg Oral QHS  . pantoprazole  40 mg Oral BID AC  . phenytoin  100 mg Oral TID  . piperacillin-tazobactam (ZOSYN)  IV  3.375 g Intravenous Q8H  . potassium chloride  40 mEq Oral BID  . QUEtiapine  50 mg Oral QHS  . sertraline  100 mg Oral Daily   Continuous Infusions:  PRN Meds: acetaminophen **OR** acetaminophen, food thickener, hydrALAZINE, LORazepam  Time spent: 30 minutes  Author: Berle Mull, MD [facility] Pager: [phone] 12/20/2015 6:23 PM  If 7PM-7AM, please contact night-coverage at [email or web address], password [facility]

## 2015-12-20 NOTE — Progress Notes (Signed)
VASCULAR LAB PRELIMINARY  PRELIMINARY  PRELIMINARY  PRELIMINARY  Bilateral lower extremity venous duplex completed.    Preliminary report:  There is no acute DVT or SVT noted in the bilateral lower extremities.  There appears to be chronic DVT noted in the left femoral vein.  Rodderick Holtzer, RVT 12/20/2015, 1:57 PM

## 2015-12-20 NOTE — Progress Notes (Signed)
PT Cancellation Note  Patient Details Name: Jocelyn Burgess MRN: [id number] DOB: [birth year]   Cancelled Treatment:    Reason Eval/Treat Not Completed: Medical issues which prohibited therapy. Pt found to have chronic DVT in L femoral vein. INR WNL but taken off Xarelto dur to concern for GI bleed. Physician contacted about possible PT treatment or hold.   Benard Rink [id number]  12/20/2015, 2:32 PM    12/20/2015, 1437 Addendum: Spoke with physician. Clear for PT treatment. There are plans to restart blood thinner.  [name], [place]

## 2015-12-20 NOTE — Procedures (Signed)
Continuous Video-EEG Monitoring Report  Patient: Jocelyn Burgess, Jocelyn Burgess     EEG No.ID: [id number]     DOB: 1931/07/03 Age: 82 Room#3S05  MED REC NO: [id number] Gender: Female   TECH: Estil Daft     Physician: Winfield Cunas     Referring Physician: Rosalin Hawking     Report Date: 12/20/2015      Study Duration: 12/19/2015 14:54 to 12/20/2015 07:30 CPT Code:  [id number] Diagnosis:  Seizures (R56.9); Altered mental status (R41.82)   History: This is an 82 year old female presenting with seizures and altered mental status.  Video-EEG monitoring was performed to evaluate for seizures.  Technical Details:  Long-term video-EEG monitoring was performed using standard setting per the guidelines.  Briefly, a minimum of 21 electrodes were placed on scalp according to the International 10-20 or/and 10-10 Systems.  Supplemental electrodes were placed as needed.  Single EKG electrode was also used to detect cardiac arrhythmia.  Patient's behavior was continuously recorded on video simultaneously with EEG.  A minimum of 16 channels were used for data display.  Each epoch of study was reviewed manually daily and as needed using standard referential and bipolar montages.    EEG Description:  There was generalized polymorphic delta slowing.  Intermittent sharp waves were present in the right posterior region (P4, T6, O2), occurring in a semi-periodic fashion.  Diffuse beta activity was seen, likely due to medication effect (such as benzodiazepine, propofol).  No posterior dominant rhythm or sleep architecture was present.  No seizure was captured.  Impression:  This is an abnormal EEG due to the presence of the following: 1) Generalized polymorphic delta slowing, suggesting severe encephalopathy; 2) Frequent focal epileptiform discharges in the right posterior region, suggesting risk of focal epileptogenicity.                                                                                   Reading Physician: Winfield Cunas, MD, PhD

## 2015-12-20 NOTE — Progress Notes (Signed)
Speech Language Pathology Treatment: Dysphagia;Cognitive-Linquistic  Patient Details Name: Jocelyn Burgess MRN: [id number] DOB: 11-25-1931 Today's Date: 12/20/2015 Time: [id number] SLP Time Calculation (min) (ACUTE ONLY): 14 min  Assessment / Plan / Recommendation Clinical Impression  Pt was initially resistant to PO trials, but with gustatory stimulation her intake improved. Pt consumed 4 ounces of nectar thick liquids and a container of pudding with one cough after initial bolus of liquid. Pt needed Max cues for sustained attention and bolus awareness, which likely contributed to suspected penetration/aspiration. Current diet still appears appropriate given no further overt signs of aspiration. Will continue to monitor.   HPI HPI: Pt is an 82 y.o. female with [place] of pulmonary embolism, dementia, COPD on chronic 2 - 3L of oxygen, HTN, DM2, remote history of breast cancer, tonic hyponatremia, recurrent hypokalemia, admitted on 7/3 after a witnessed seizure that lasted for 10 minutes, after which pt was unable to move L arm and leg. Pt "somewhat confused" at baseline. MRI showed acute L posterior frontal infarct. CXR showed prominence in R perihilar region concerning for mass or adenopathy. Pt passed RN stroke swallow screen on 7/3, bedside swallow eval order...      SLP Plan  Continue with current plan of care     Recommendations  Diet recommendations: Dysphagia 1 (puree);Nectar-thick liquid Liquids provided via: Straw Medication Administration: Crushed with puree Supervision: Staff to assist with self feeding;Full supervision/cueing for compensatory strategies Compensations: Minimize environmental distractions;Slow rate;Small sips/bites Postural Changes and/or Swallow Maneuvers: Seated upright 90 degrees;Upright 30-60 min after meal             Oral Care Recommendations: Oral care BID Follow up Recommendations: Skilled Nursing facility Plan: Continue with current plan of care      GO               Germain Osgood, M.A. CCC-SLP [phone]  Germain Osgood 12/20/2015, 10:20 AM

## 2015-12-20 NOTE — Clinical Social Work Note (Signed)
Clinical Social Work Assessment  Patient Details  Name: Jocelyn Burgess MRN: [id number] Date of Birth: 1931/11/26  Date of referral:  12/19/15               Reason for consult:  Facility Placement, Discharge Planning                Permission sought to share information with:  Facility Sport and exercise psychologist, Family Supports Permission granted to share information::  Yes, Verbal Permission Granted  Name::     [name]  Agency::  SNF's  Relationship::  Husband  Contact Information:  [phone]  Housing/Transportation Living arrangements for the past 2 months:  [place] of Information:  Medical Team, Spouse Patient Interpreter Needed:  None Criminal Activity/Legal Involvement Pertinent to Current Situation/Hospitalization:  No - Comment as needed Significant Relationships:  Spouse Lives with:  Facility Resident Do you feel safe going back to the place where you live?  Yes Need for family participation in patient care:  Yes (Comment)  Care giving concerns:  PT recommending SNF once patient medically stable for discharge.   Social Worker assessment / plan:  CSW met with patient. Patient only oriented to self. Husband at bedside. CSW introduced role and explained that discharge planning would be discussed. CSW and patient's husband discussed PT recommendations for SNF. Patient's husband agreeable. She was at [facility] until April 18 and that is first preference. Hospital Liason for [facility] notified. No further concerns. CSW encouraged patient's husband to contact CSW as needed. CSW will continue to follow patient and her husband for support and facilitate discharge to SNF once medically stable.  Employment status:  Retired Nurse, adult PT Recommendations:  [place] / Referral to community resources:  [place]  Patient/Family's Response to care:  Patient only oriented to self. Patient's  husband agreeable to SNF placement. Patient's husband supportive and involved in patient's care. Patient's husband appreciated social work intervention.  Patient/Family's Understanding of and Emotional Response to Diagnosis, Current Treatment, and Prognosis:  Patient only oriented to self. Patient's husband understands need for rehab prior to returning to ALF. Patient's husband appears happy with hospital care.  Emotional Assessment Appearance:  Appears stated age Attitude/Demeanor/Rapport:  Unable to Assess Affect (typically observed):  Unable to Assess Orientation:  Oriented to Self Alcohol / Substance use:  Never Used Psych involvement (Current and /or in the community):  No (Comment)  Discharge Needs  Concerns to be addressed:  Care Coordination Readmission within the last 30 days:  No Current discharge risk:  Dependent with Mobility, Cognitively Impaired Barriers to Discharge:  No Barriers Identified   Candie Chroman, LCSW 12/20/2015, 11:15 AM

## 2015-12-20 NOTE — Clinical Social Work Note (Signed)
CSW called and left voicemail for patient's husband to discuss PT recommendations for SNF.   Dayton Scrape, [place]

## 2015-12-20 NOTE — Progress Notes (Signed)
Physical Therapy Treatment Patient Details Name: Jocelyn Burgess MRN: [id number] DOB: June 02, 1932 Today's Date: 12/20/2015    History of Present Illness Patient is a 82 y/o female with hx of COPD, chronic respiratory failure, DM, HTN, breast ca, depression, dementia, PE, seizures presents from [name] due to witnessed seizure lasting 10 minutes. Found to have significant anemia Hemoccult-positive stool and hypokalemia. MRI-Acute LEFT posterior frontal cortical infarct, 5 mm in size    PT Comments    Pt seen in bed with spouse present. Pt very restless and trying to pull at cords when mittens were off when trying to stand. Pt's cognition still very impared.  Pt is slowly progressing towards goals and would benefit from further PT with progression as tolerated to increase her functional ability. Will bring RW next session to help pt stand.  Follow Up Recommendations  SNF     Equipment Recommendations  Wheelchair (measurements PT);Wheelchair cushion (measurements PT)    Recommendations for Other Services       Precautions / Restrictions Precautions Precautions: Fall Precaution Comments: seizure Restrictions Weight Bearing Restrictions: No    Mobility  Bed Mobility Overal bed mobility: Needs Assistance (3+ for physical assistance) Bed Mobility: Rolling;Sidelying to Sit Rolling: Total assist;+2 for physical assistance Sidelying to sit: Max assist;HOB elevated;Mod assist (3+ for physical assist)   Sit to supine: Max assist;HOB elevated (3+ for physical assist, )   General bed mobility comments: Pt total assist to roll to her side. Pt needed multiple verbal and tactile cues to grab onto the bed rail to help roll herself over. She kept letting go of the rail and moving her hand around. Max assist 3+ needed to move pt to sitting. Once sitting pt needed mod assist to keep sitting up. Pt was very restless and kept flainling her arms around and grabbing at anything she could. Pt kept  stating, "they wont let me go" "they are holding me back", "I'm trying to stand and they won't let me" Pt resistant to lay back in bed at first, but eventually agreed to lay back in bed.  Transfers Overall transfer level: Needs assistance Equipment used: None Transfers: Sit to/from Stand Sit to Stand: Max assist (3+ for physical assistance and safety)         General transfer comment: Pt max assist to stand up. Pt able to stand straight when ask for a few seconds. increased trunk flexion required her to sit. Pt did not realize she had just stood up and repeated, "I'm trying to stand and they won't let me."      Balance Overall balance assessment: Needs assistance Sitting-balance support: Feet unsupported;Bilateral upper extremity supported Sitting balance-Leahy Scale: Zero Sitting balance - Comments: Pt leaning lateral, posterior and anterior sitting EOB. Pt able to sit straight up when asked, but went back into trunk flexion after a few seconds. Needed 2+ assist to sit and kept grabbing for different objects.                            Cognition Arousal/Alertness: Awake/alert (not alert) Behavior During Therapy: Restless;Anxious (very restless) Overall Cognitive Status: History of cognitive impairments - at baseline Area of Impairment: Orientation;Attention;Following commands;Awareness;Safety/judgement;Problem solving;Memory Orientation Level: Disoriented to;Person;Place;Time;Situation Current Attention Level: Selective Memory: Decreased short-term memory Following Commands: Follows one step commands inconsistently Safety/Judgement: Decreased awareness of deficits;Decreased awareness of safety Awareness: Intellectual Problem Solving: Slow processing;Decreased initiation;Difficulty sequencing;Requires verbal cues;Requires tactile cues General Comments: Pt very restless  anf figgity,  pulling on cords when mittens were off. Pt gave her name when asked first time, but not  any time after. Pt could not give correct date, location, or why she was in the hospital. She could recognize husband when prompted, but could not give his name or how long they have been married.  Pt said yes to everything and also repeated words that were said to her during questioning. Pt stated she could see fingers being help up, but could not correctly tell how many.    Exercises      General Comments General comments (skin integrity, edema, etc.): Spouse present during session. Upon arival pt was looking straight up at the ceiling. When asking if she could see anyone she kept saying no, but she was not moving her head to look for people.      Pertinent Vitals/Pain Pain Assessment: No/denies pain        Frequency  Min 3X/week         End of Session Equipment Utilized During Treatment: Gait belt Activity Tolerance: Treatment limited secondary to agitation Patient left: in bed;with call bell/phone within reach;with bed alarm set;with family/visitor present     Time: [id number] PT Time Calculation (min) (ACUTE ONLY): 40 min  Charges:                       G CodesNash Mantis, Jocelyn Burgess [phone]  12/20/2015, 5:45 PM

## 2015-12-21 LAB — CBC WITH DIFFERENTIAL/PLATELET
Basophils Absolute: 0 10*3/uL (ref 0.0–0.1)
Basophils Relative: 0 %
EOS ABS: 0.1 10*3/uL (ref 0.0–0.7)
Eosinophils Relative: 1 %
HEMATOCRIT: 37.9 % (ref 36.0–46.0)
HEMOGLOBIN: 11.6 g/dL — AB (ref 12.0–15.0)
LYMPHS ABS: 1.6 10*3/uL (ref 0.7–4.0)
Lymphocytes Relative: 15 %
MCH: 23.5 pg — AB (ref 26.0–34.0)
MCHC: 30.6 g/dL (ref 30.0–36.0)
MCV: 76.9 fL — ABNORMAL LOW (ref 78.0–100.0)
Monocytes Absolute: 0.9 10*3/uL (ref 0.1–1.0)
Monocytes Relative: 9 %
NEUTROS ABS: 8 10*3/uL — AB (ref 1.7–7.7)
NEUTROS PCT: 75 %
Platelets: 541 10*3/uL — ABNORMAL HIGH (ref 150–400)
RBC: 4.93 MIL/uL (ref 3.87–5.11)
RDW: 20.7 % — ABNORMAL HIGH (ref 11.5–15.5)
WBC: 10.6 10*3/uL — AB (ref 4.0–10.5)

## 2015-12-21 LAB — TYPE AND SCREEN
ABO/RH(D): A POS
ANTIBODY SCREEN: NEGATIVE
UNIT DIVISION: 0
UNIT DIVISION: 0
Unit division: 0

## 2015-12-21 LAB — COMPREHENSIVE METABOLIC PANEL
ALBUMIN: 3.3 g/dL — AB (ref 3.5–5.0)
ALT: 12 U/L — AB (ref 14–54)
AST: 16 U/L (ref 15–41)
Alkaline Phosphatase: 72 U/L (ref 38–126)
Anion gap: 13 (ref 5–15)
BUN: 9 mg/dL (ref 6–20)
CHLORIDE: 100 mmol/L — AB (ref 101–111)
CO2: 26 mmol/L (ref 22–32)
Calcium: 9.8 mg/dL (ref 8.9–10.3)
Creatinine, Ser: 0.61 mg/dL (ref 0.44–1.00)
GFR calc Af Amer: >60 mL/min (ref >60)
GLUCOSE: 196 mg/dL — AB (ref 65–99)
POTASSIUM: 2.9 mmol/L — AB (ref 3.5–5.1)
SODIUM: 139 mmol/L (ref 135–145)
Total Bilirubin: 1 mg/dL (ref 0.3–1.2)
Total Protein: 7.4 g/dL (ref 6.5–8.1)

## 2015-12-21 LAB — GLUCOSE, CAPILLARY
GLUCOSE-CAPILLARY: 240 mg/dL — AB (ref 65–99)
GLUCOSE-CAPILLARY: 140 mg/dL — AB (ref 65–99)
Glucose-Capillary: 221 mg/dL — ABNORMAL HIGH (ref 65–99)
Glucose-Capillary: 146 mg/dL — ABNORMAL HIGH (ref 65–99)

## 2015-12-21 LAB — PHENYTOIN LEVEL, TOTAL: Phenytoin Lvl: 11.7 ug/mL (ref 10.0–20.0)

## 2015-12-21 LAB — LEGIONELLA PNEUMOPHILA SEROGP 1 UR AG: L. PNEUMOPHILA SEROGP 1 UR AG: NEGATIVE

## 2015-12-21 LAB — MAGNESIUM: MAGNESIUM: 1.8 mg/dL (ref 1.7–2.4)

## 2015-12-21 LAB — PROTIME-INR
INR: 1.2 (ref 0.00–1.49)
PROTHROMBIN TIME: 15.4 s — AB (ref 11.6–15.2)

## 2015-12-21 MED ORDER — POTASSIUM CHLORIDE 20 MEQ PO PACK
40.0000 meq | PACK | Freq: Once | ORAL | Status: AC
  Administered 2015-12-21: 40 meq via ORAL
  Filled 2015-12-21 (×2): qty 2

## 2015-12-21 MED ORDER — FUROSEMIDE 10 MG/ML IJ SOLN
20.0000 mg | Freq: Once | INTRAMUSCULAR | Status: AC
  Administered 2015-12-21: 20 mg via INTRAVENOUS
  Filled 2015-12-21: qty 2

## 2015-12-21 MED ORDER — PHENYTOIN SODIUM EXTENDED 100 MG PO CAPS
300.0000 mg | ORAL_CAPSULE | Freq: Once | ORAL | Status: AC
  Administered 2015-12-21: 300 mg via ORAL

## 2015-12-21 MED ORDER — POTASSIUM CHLORIDE 10 MEQ/100ML IV SOLN
10.0000 meq | INTRAVENOUS | Status: AC
  Administered 2015-12-21 (×2): 10 meq via INTRAVENOUS
  Filled 2015-12-21 (×2): qty 100

## 2015-12-21 MED ORDER — CARVEDILOL 6.25 MG PO TABS
6.2500 mg | ORAL_TABLET | Freq: Two times a day (BID) | ORAL | Status: DC
  Administered 2015-12-21 – 2015-12-23 (×4): 6.25 mg via ORAL
  Filled 2015-12-21 (×4): qty 1

## 2015-12-21 MED ORDER — POLYETHYLENE GLYCOL 3350 17 G PO PACK
17.0000 g | PACK | Freq: Every day | ORAL | Status: DC
  Administered 2015-12-21 – 2015-12-23 (×2): 17 g via ORAL
  Filled 2015-12-21 (×2): qty 1

## 2015-12-21 MED ORDER — DIVALPROEX SODIUM 500 MG PO DR TAB
500.0000 mg | DELAYED_RELEASE_TABLET | Freq: Three times a day (TID) | ORAL | Status: DC
  Administered 2015-12-21 – 2015-12-22 (×3): 500 mg via ORAL
  Filled 2015-12-21 (×5): qty 1

## 2015-12-21 NOTE — Progress Notes (Signed)
STROKE TEAM PROGRESS NOTE   SUBJECTIVE (INTERVAL HISTORY) Patient agitated this am per RN. Has semi-rhythmic jerking of LLE, somewhat of LUE, but no seizure on EEG, likely due to myoclonus. Mitts on. LT EEG monitor still showed frequent sharp waves at right. Patient button for EEG compressed. EEG monitor with camera repositioned to be within site of patient (placed up against the wall, RN stated where it was when she cam in). She was talkative with RN this am and ate about 50% of her breakfast. Dilantin corrected level today 15, will give an oral load. Meantime, will add depakote in hope of EEG improvement.   OBJECTIVE Temp:  [96.9 F (36.1 C)-99 F (37.2 C)] 98.2 F (36.8 C) (07/07 0755) Pulse Rate:  [78-132] 132 (07/07 0900) Cardiac Rhythm:  [-] Normal sinus rhythm (07/07 0741) Resp:  [19-33] 19 (07/07 0900) BP: (129-222)/(61-120) 160/93 mmHg (07/07 0755) SpO2:  [86 %-99 %] 98 % (07/07 0835)   Recent Labs Lab 12/20/15 0750 12/20/15 1147 12/20/15 1717 12/20/15 2135 12/21/15 0757  GLUCAP 195* 273* 280* 94 240*    Recent Labs Lab 12/17/15 1811 12/17/15 1819 12/17/15 2220 12/18/15 0917 12/19/15 0539 12/20/15 0130 12/21/15 0448  NA 134* 137  --  135 136 136 139  K 2.5* 2.6*  --  3.4* 3.0* 3.0* 2.9*  CL 101 99*  --  102 95* 100* 100*  CO2 19*  --   --  25 26 26 26   GLUCOSE 269* 265*  --  140* 138* 254* 196*  BUN 13 13  --  12 7 7 9   CREATININE 0.76 0.50  --  0.58 0.71 0.60 0.61  CALCIUM 9.1  --   --  8.8* 8.9 9.3 9.8  MG  --   --  1.3*  --   --  1.5* 1.8  PHOS  --   --  3.0  --   --   --   --     Recent Labs Lab 12/17/15 1811 12/18/15 0917 12/19/15 0539 12/20/15 0130 12/21/15 0448  AST 21 20 21 18 16   ALT 17 15 15  13* 12*  ALKPHOS 59 51 55 61 72  BILITOT 0.4 0.8 1.4* 0.7 1.0  PROT 7.3 6.8 6.7 6.6 7.4  ALBUMIN 3.4* 3.4* 3.3* 3.2* 3.3*    Recent Labs Lab 12/17/15 1811  12/18/15 0917  12/19/15 0539 12/19/15 0954 12/19/15 1830 12/20/15 0130  12/21/15 0448  WBC 9.1  --  7.5  --  8.3  --   --  10.4 10.6*  NEUTROABS 7.3  --   --   --   --   --   --  8.1* 8.0*  HGB 6.5*  < > 7.8*  < > 9.3* 9.1* 9.1* 9.6*  9.6* 11.6*  HCT 23.1*  < > 26.2*  < > 30.8* 29.6* 29.8* 32.3*  32.0* 37.9  MCV 73.3*  --  75.1*  --  76.4*  --   --  76.5* 76.9*  PLT 452*  --  455*  --  365  --   --  450* 541*  < > = values in this interval not displayed.  Recent Labs Lab 12/17/15 2332 12/18/15 0501 12/18/15 0917 12/18/15 2228  TROPONINI 0.08* 0.06* 0.06* 0.04*    Recent Labs  12/19/15 0539 12/20/15 0130 12/21/15 0448  LABPROT 16.6* 16.5* 15.4*  INR 1.33 1.32 1.20    Recent Labs  12/19/15 1827  COLORURINE YELLOW  LABSPEC 1.046*  PHURINE 7.0  GLUCOSEU NEGATIVE  HGBUR NEGATIVE  BILIRUBINUR SMALL*  KETONESUR 15*  PROTEINUR NEGATIVE  NITRITE NEGATIVE  LEUKOCYTESUR NEGATIVE       Component Value Date/Time   CHOL 103 12/18/2015 0501   TRIG 111 12/18/2015 0501   HDL 44 12/18/2015 0501   CHOLHDL 2.3 12/18/2015 0501   VLDL 22 12/18/2015 0501   LDLCALC 37 12/18/2015 0501   Lab Results  Component Value Date   HGBA1C 6.3* 12/18/2015      Component Value Date/Time   LABOPIA NONE DETECTED 12/17/2015 2028   LABOPIA POSITIVE* 08/26/2015 0514   COCAINSCRNUR NONE DETECTED 12/17/2015 2028   COCAINSCRNUR NONE DETECTED 08/26/2015 0514   LABBENZ NONE DETECTED 12/17/2015 2028   LABBENZ POSITIVE* 08/26/2015 0514   AMPHETMU NONE DETECTED 12/17/2015 2028   AMPHETMU NONE DETECTED 08/26/2015 0514   THCU NONE DETECTED 12/17/2015 2028   THCU NONE DETECTED 08/26/2015 0514   LABBARB NONE DETECTED 12/17/2015 2028   LABBARB NONE DETECTED 08/26/2015 0514     Recent Labs Lab 12/17/15 1811  ETH <5   IMAGING I have personally reviewed the radiological images below and agree with the radiology interpretations.  CXR 12/19/2015 Enlargement of cardiac silhouette with pulmonary vascular Congestion. Persistent pulmonary edema with bibasilar atelectasis  and small RIGHT pleural effusion. Aortic atherosclerosis.  Ct Chest W Contrast 12/18/2015  IMPRESSION: Development of bilateral pleural effusions right larger than left with dependent atelectasis bilaterally and near complete collapse of the right lower lobe. Interstitial and alveolar pattern suggesting fluid overload/ pulmonary edema diffusely. Multiple spiculated densities in the lower lobes have done smaller/retracted somewhat since the study of May. This favors inflammatory disease. Tumor hidden amongst this is not excluded. Right hilar lymphadenopathy persists, difficult to measure accurately because of the right lower lobe collapse. Note that the right lower lobe bronchus is now full of either mucous, pus or tumor. Enlargement of a pretracheal lymph node image 21. Previously this measured 12 mm. Today it measured 19 mm. 8   Mr Brain Wo Contrast 12/18/2015 IMPRESSION: Prematurely truncated scan due to lack of patient cooperation. Acute LEFT posterior frontal cortical infarct, 5 mm in size. This appears nonhemorrhagic. Atrophy and small vessel disease.   Ct Abdomen Pelvis W Contrast 12/18/2015  IMPRESSION: No significant finding in the abdomen or pelvis of an acute nature. Aortic atherosclerosis. Previously seen T12 fracture which has not progressed.   Ct Head Code Stroke W/o Cm 12/17/2015 IMPRESSION: No acute intracranial abnormality. Atrophy, chronic microvascular disease.   EEG This is an abnormal EEG due to mild diffuse generalized slowing of the background. This is consistent with the patient's known history of dementia.  She had a single electrographic seizure that appears to start in the right temporal region with rapid generalization. This lasted about 50 seconds in total and was associated clinically with versive left head turn and unresponsiveness.   CTA head and neck 12/19/2015  1. Negative for emergent large vessel occlusion or hemodynamically significant carotid or anterior circulation  stenosis.  2. Positive for severe atherosclerosis of the aortic arch, including several areas of ulcerated plaque. 3. Positive for posterior circulation stenoses:  - moderate to severe Left vertebral artery origin stenosis, - mild to moderate distal Right vertebral artery and proximal Left PCA stenoses. 4. Stable CT appearance of the brain. 5. Persistent small pleural effusions with suspected pulmonary edema superimposed on emphysema.  2D Echocardiogram  - Left ventricle: The cavity size was normal. Systolic function was normal. Wall motion was normal; there were no regional wall motion abnormalities.  Features are consistent with a pseudonormal left ventricular filling pattern, with concomitant abnormal relaxation and increased filling pressure (grade 2 diastolic dysfunction). - Aortic valve: Trileaflet; mildly thickened, mildly calcified leaflets. - Mitral valve: Calcified annulus. There was mild regurgitation. - Pulmonary arteries: PA peak pressure: 75 mm Hg (S). Impressions:   The right ventricular systolic pressure was increased consistent with severe pulmonary hypertension.  LE venous doppler - There is no acute DVT or SVT noted in the bilateral lower extremities. There appears to be chronic DVT noted in the left femoral vein.  LTM EEG  12/21/2015 This is an abnormal EEG due to the presence of the following: 1) Generalized polymorphic delta and theta slowing, suggesting moderate to severe encephalopathy; 2) Frequent focal epileptiform discharges in the right posterior region, suggesting risk of focal epileptogenicity. Compared with the last study, current study showed slightly improved background activity but the right-sided epileptiform discharges were essentially unchanged.  12/20/15 - This is an abnormal EEG due to the presence of the following: 1) Generalized polymorphic delta slowing, suggesting severe encephalopathy; 2) Frequent focal epileptiform discharges in the right posterior  region, suggesting risk of focal epileptogenicity.   PHYSICAL EXAM General - Well nourished, well developed, in no apparent distress.  Ophthalmologic - fundi not visualized due to noncooperation.  Cardiovascular - Regular rate and rhythm with no murmur.  Neck - supple, no carotid bruits  Mental Status -  Sleepy but easily arousable, disorientated to self, place, people, age and time.  Confused, intermittently follow some simple commands, able to say simple words or short sentences, no dysarthria. Naming and repetition not cooperative.   Cranial Nerves II - XII - II - inconsistently blinking to visual threat bilaterally III, IV, VI - Extraocular movements intact. V - Facial sensation intact bilaterally. VII - Facial movement intact bilaterally VIII - Hearing & vestibular intact bilaterally. X - Palate elevates symmetrically. XI - Chin turning & shoulder shrug intact bilaterally. XII - Tongue protrusion intact.  Motor Strength - The patient's strength was RUEs at least 4/5 but LUE 3/5 with drift and 3/5 RLE and 2/5 LLE.  Bulk was normal and fasciculations were absent.   Motor Tone - Muscle tone was assessed at the neck and appendages and was normal.  Reflexes - The patient's reflexes were symmetrical in all extremities and she no babinski.  Sensory - not cooperative.    Coordination - mild action tremor at LUE, no asterixis seen today. Semi-rhythmic LLE myoclonus.  Gait and Station - not tested.   ASSESSMENT/PLAN Ms. GEORGIANNE MONTAS is a 82 y.o. female with history of DM, COPD, HTN, HLD, depression, dementia, PE in 07/2015 on Xarelto but currently on hold due to anemia admitted for GTC seizure. she has continued left UE and LE weakness, and EEG showed seizure coming out from right temporal region. MRI showed left CR/SO small infarct which is incidental finding and not correlating to her symptoms.    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  MRI - not tolerating for  seizure work up   CT without contrast negative  EEG - seizure originating from right temporal lobe  Continue LTM EEG for now  LTM EEG - frequent right posterior epileptiform discharges  Dilantin corrected level 15 (11.7 this am), sub-therapeutic - 300mg  oral load  Daily dilantin level  Continue vimpat to 200mg  bid  Add depakote 500mg  tid, and check depakote level daily in am  Pt will need MRI with and without contrast if able to tolerate  MRI  Consider LP if develop fever or worsening neuro status  Stroke: incidental left frontal subcortical (CR/SO) infarct - likely due to small vessel disease, not correlating to current symptoms.   MRI left small CR/SO infarct  CTA head and neck - no large vessel occlusion  2D Echo  Severe pulmonary hypertension  LE venous doppler no acute DVT but left old DVT  LDL 37  HgbA1c 6.3  SCDs for VTE prophylaxis  DIET - DYS 1 Room service appropriate?: Yes; Fluid consistency:: Nectar Thick   Xarelto (rivaroxaban) daily prior to admission, now on aspirin 325 mg daily. Pt has severe anemia requiring blood transfusion. From stroke standpoint, Xarelto is likely not needed as the infarct likely due to small vessel disease. Also Xarelto may put pt on high risk of bleeding, further worsen anemia.   Ongoing aggressive stroke risk factor management  Therapy recommendations:  SNF  Disposition:  Pending   Anemia  Hb 6.5 -> 9.3-> 9.6  S/p blood transfusion  FOB positive  Xarelto discontinued due to anemia needing blood transfusion and FOB positive  B/l pleural effusion and RLL atelectasis   Underlying tumor not excluded  Management as per primary team  On unasyn  May need tumor work up once stabilized  History of PE on Xarelto  PE in 07/2015 on Xarelto since  LE venous doppler no acute DVT but old left DVT  Xarelto discontinued due to anemia requiring transfusionPt had 4-5 months of Xarelto already, may consider d/c Xarelto  anyway given no acute DVT this time.  Pt already had 4-5 months of Xarelto, may consider d/c Xarelto anyway given no acute DVT this time.  Diabetes  HgbA1c 6.3 goal < 7.0  Controlled  Hypertension  Home meds:   clonidine Permissive hypertension and gradually normalized within 5-7 days. Currently on clonidine  Stable  Hyperlipidemia  Home meds:  lipitor    Currently on lipitor  LDL 37, goal < 70  Continue statin at discharge  Other Stroke Risk Factors  Advanced age  Other Active Problems  Dementia on aricept and seroquel. seroquel decreased to q hs yesterdya.  Depression on zoloft  Other Pertinent History  COPD  ? Cancer history  Hospital day # 4  The patient continues to have abnormal EEG with frequent epileptiform discharges. Dilantin level subtherapeutic and will give dilantin load. Continue vimpat, will add depakote. Dr. Posey Pronto to discuss with husband regarding goal of care. She is at risk for status epilepticus, recurrent strokes and neurological worsening. She needs ongoing seizure control, monitoring and stroke work up and aggressive risk factor modification.  Jocelyn Hawking, Jocelyn Burgess Stroke Neurology 12/21/2015 2:12 PM    To contact Stroke Continuity provider, please refer to [email or web address] After hours, contact General Neurology

## 2015-12-21 NOTE — Progress Notes (Signed)
Patient resting quieltly, PRN ativan given only once this shift. Patient still being monitored with continuous EEG. Vitals are stable, no evidence of pain. Call bell in reach, will continue to monitor.

## 2015-12-21 NOTE — Progress Notes (Signed)
SLP Cancellation Note  Patient Details Name: Jocelyn Burgess MRN: [id number] DOB: Jun 07, 1932   Cancelled treatment:       Reason Eval/Treat Not Completed: Medical issues which prohibited therapy. Pt with ongoing EEG monitoring for seizure activity. Sleeping. SLP will follow for readiness.    [name], [name] 12/21/2015, 11:16 AM

## 2015-12-21 NOTE — Procedures (Signed)
Continuous Video-EEG Monitoring Report    Patient:  Jocelyn Burgess, Jocelyn Burgess         EEG No.ID:  [id number]         DOB:  [birth year]  Age:  82  Room#3S05   MED REC NO:  [id number]  Gender:  Female     TECH:  Estil Daft         Physician:  Winfield Cunas         Referring Physician:  Rosalin Hawking         Report Date:  12/21/2015          Study Duration:         12/20/2015 07:30 to 12/21/2015 07:30 CPT Code:                 [id number] Diagnosis:                 Seizures (R56.9); Altered mental status (R41.82)   History: This is an 82 year old female presenting with seizures and altered mental status.  Video-EEG monitoring was performed to evaluate for seizures.  Technical Details:  Long-term video-EEG monitoring was performed using standard setting per the guidelines.  Briefly, a minimum of 21 electrodes were placed on scalp according to the International 10-20 or/and 10-10 Systems.  Supplemental electrodes were placed as needed.  Single EKG electrode was also used to detect cardiac arrhythmia.  Patient's behavior was continuously recorded on video simultaneously with EEG.  A minimum of 16 channels were used for data display.  Each epoch of study was reviewed manually daily and as needed using standard referential and bipolar montages.    EEG Description:  There was generalized polymorphic delta and theta slowing.  Intermittent sharp waves were present in the right posterior region (P4, T6, O2), occurring in a semi-periodic fashion.  Diffuse beta activity was seen, likely due to medication effect (such as benzodiazepine, propofol).  No posterior dominant rhythm or sleep architecture was present.  No seizure was captured.  Impression:  This is an abnormal EEG due to the presence of the following: 1) Generalized polymorphic delta and theta slowing, suggesting moderate to severe encephalopathy; 2) Frequent focal epileptiform discharges in the right posterior region, suggesting risk of focal epileptogenicity.  Compared with  the last study, current study showed slightly improved background activity but the right-sided epileptiform discharges were essentially unchanged.                                                                              [place] Physician: Winfield Cunas, MD, PhD

## 2015-12-21 NOTE — Progress Notes (Signed)
[facility] Progress Note  Patient: Jocelyn Burgess [id number]   PCP: Bobetta Lime, MD DOB: [birth year]   DOA: 12/17/2015   DOS: 12/21/2015   Date of Service: the patient was seen and examined on 12/21/2015  Subjective: patient Shows improvement in orientation. Denies any acute complaint. Continues to have agitation. Nutrition: tolerating oral diet  Brief hospital course: Pt. with PMH of PE, dementia, COPD, on 2 L at home, HTN, DM 2, breast cancer; admitted on 12/17/2015, with complaint of seizure followed by Todd's paralysis, was found to have seizure, CVA, possible pneumonia. Currently further plan is continue supportive management.  Assessment and Plan: 1. Seizure ([place]) EEG is positive for right temporal lobe seizure focus. CT head without any acute abnormality. started on Vimpat as well as Dilantin 100 mg 3 times a day. Depakote added on 12/21/2015. We will get MRI brain with and without contrast once stable. Monitor closely for aspiration event as well as recurrent seizures. Currently having long-term EEG monitoring. Prognosis is significantly guarded with encephalopathy, poor by mouth intake as well as pneumonia.  2. Acute left posterior frontal infarct 5 mm size. Patient on Xarelto at home for DVT which was 3 months ago. Currently on aspirin due to concern of GI bleed. Neurology following. Continue Lipitor. Continue NIHS. PTOT and speech consultation.  3. Aspiration pneumonia, healthcare associated. Acute on chronic hypoxic respiratory failure. COPD with chronic respiratory failure and 2 L of oxygen. Acute on chronic suspected diastolic dysfunction.  The patient is a resident of nursing home. Comes in with seizures as well as acute encephalopathy. Speech therapy recommends dysphagia type I diet. CT chest with contrast shows evidence of possible pulmonary edema as well as right lower lobe consolidation and pleural effusion. No significant wheezing on  examination. Continue duo nebs, added Mucinex,add chest PT Low threshold to use BiPAP for respiratory distress. Initially patient was on Unasyn antibiotic changed to Zosyn. We given a 40 of IV Lasix due to x-ray showing evidence of pulmonary congestion.  4. Acute encephalopathy. Multifactorial in the setting of ongoing seizure activity as well as daily ADL with infection. Given that the patient is more hypoxic currently I will discontinue Aricept, reduce the dose of Seroquel from 50 mg twice a day to 50 mg daily, discontinue clonidine 0.1 mg twice a day and use hydralazine.  5. Symptomatic anemia. Status post 1 PRBC on 12/18/2015 as well as Feraheme 1. GI consulted who recommended no acute intervention at present. Xarelto is currently on hold. Recommend to discontinue it since no acute DVT identified. H&H remain stable will continue to monitor.  6. History of pulmonary embolism, chronic DVT on vascular Doppler On anticoagulation at home which is currently on hold due to hemoptysis and GI bleed concern. Continue aspirin. Resume DVT prophylaxis on 12/20/2015.  7. Essential hypertension. Blood pressure initially was elevated currently allowing permissive hypertension in the setting of CVA. Clonidine twice a day is discontinued due to low blood pressure as well as psychotropic effect. Can use when necessary hydralazine. Adding Coreg twice a day.  8. Hypokalemia. Replacing.  9. diabetes mellitus. Continue sliding scale.  10.goals of care discussion. I discussed with patient's husband regarding patient's poor prognosis given her acute encephalopathy as well as ongoing dysphagia and a possible aspiration pneumonia requiring significant amount of oxygen. Husband is hopeful for improvement in patient's condition once a seizure is better. We'll continue to monitor the patient's course over the weekend.  Pain management: continue Tylenol when necessary Activity: SNF as per  physical  therapy Bowel regimen: last BM 12/18/2015 stool softener added Diet: Dysphagia type I, cardiac and carb modified diet.  DVT Prophylaxis: subcutaneous heparin  Advance goals of care discussion: DNR/DNI  Family Communication: no family was present at bedside, at the time of interview.   Disposition:  Discharge to SNF once stable. Expected discharge date: 12/24/2015  Consultants: Neurology, gastroenterology Procedures: EEG  Antibiotics: Anti-infectives    Start     Dose/Rate Route Frequency Ordered Stop   12/19/15 1800  piperacillin-tazobactam (ZOSYN) IVPB 3.375 g     3.375 g 12.5 mL/hr over 240 Minutes Intravenous Every 8 hours 12/19/15 1751     12/18/15 1000  Ampicillin-Sulbactam (UNASYN) 3 g in sodium chloride 0.9 % 100 mL IVPB  Status:  Discontinued     3 g 100 mL/hr over 60 Minutes Intravenous Every 8 hours 12/18/15 0952 12/19/15 1742        Intake/Output Summary (Last 24 hours) at 12/21/15 1421 Last data filed at 12/21/15 0924  Gross per 24 hour  Intake    395 ml  Output    150 ml  Net    245 ml   Filed Weights   12/18/15 1100  Weight: 66.1 kg (145 lb 11.6 oz)    Objective: Physical Exam: Filed Vitals:   12/21/15 0900 12/21/15 1000 12/21/15 1100 12/21/15 1200  BP:    197/77  Pulse: 132 28 88 97  Temp:    99.1 F (37.3 C)  TempSrc:    Axillary  Resp: 19 24 23 21   Height:      Weight:      SpO2: 95% 87% 97% 95%    General: Alert, Awake and Disoriented Time, Place and Person. Appear in moderate distress Eyes: PERRL, Conjunctiva normal ENT: Oral Mucosa clear moist. Neck: difficult to assess JVD, no Abnormal Mass Or lumps Cardiovascular: S1 and S2 Present, aortic systolic Murmur, Respiratory: Bilateral Air entry equal and Decreased, bilateral Crackles, no wheezes Abdomen: Bowel Sound present, Soft and no tenderness Skin: no redness, no Rash  Extremities: no Pedal edema, no calf tenderness Neurologic: Left-sided weakness, Showing improvement. Complete  examination is difficult due to confusion  Data Reviewed: CBC:  Recent Labs Lab 12/17/15 1811  12/18/15 0917  12/19/15 0539 12/19/15 0954 12/19/15 1830 12/20/15 0130 12/21/15 0448  WBC 9.1  --  7.5  --  8.3  --   --  10.4 10.6*  NEUTROABS 7.3  --   --   --   --   --   --  8.1* 8.0*  HGB 6.5*  < > 7.8*  < > 9.3* 9.1* 9.1* 9.6*  9.6* 11.6*  HCT 23.1*  < > 26.2*  < > 30.8* 29.6* 29.8* 32.3*  32.0* 37.9  MCV 73.3*  --  75.1*  --  76.4*  --   --  76.5* 76.9*  PLT 452*  --  455*  --  365  --   --  450* 541*  < > = values in this interval not displayed. Basic Metabolic Panel:  Recent Labs Lab 12/17/15 1811 12/17/15 1819 12/17/15 2220 12/18/15 0917 12/19/15 0539 12/20/15 0130 12/21/15 0448  NA 134* 137  --  135 136 136 139  K 2.5* 2.6*  --  3.4* 3.0* 3.0* 2.9*  CL 101 99*  --  102 95* 100* 100*  CO2 19*  --   --  25 26 26 26   GLUCOSE 269* 265*  --  140* 138* 254* 196*  BUN 13 13  --  12 7 7 9   CREATININE 0.76 0.50  --  0.58 0.71 0.60 0.61  CALCIUM 9.1  --   --  8.8* 8.9 9.3 9.8  MG  --   --  1.3*  --   --  1.5* 1.8  PHOS  --   --  3.0  --   --   --   --     Liver Function Tests:  Recent Labs Lab 12/17/15 1811 12/18/15 0917 12/19/15 0539 12/20/15 0130 12/21/15 0448  AST 21 20 21 18 16   ALT 17 15 15  13* 12*  ALKPHOS 59 51 55 61 72  BILITOT 0.4 0.8 1.4* 0.7 1.0  PROT 7.3 6.8 6.7 6.6 7.4  ALBUMIN 3.4* 3.4* 3.3* 3.2* 3.3*    Recent Labs Lab 12/18/15 0917  LIPASE 18   No results for input(s): AMMONIA in the last 168 hours. Coagulation Profile:  Recent Labs Lab 12/17/15 1811 12/19/15 0539 12/20/15 0130 12/21/15 0448  INR 2.70* 1.33 1.32 1.20   Cardiac Enzymes:  Recent Labs Lab 12/17/15 2332 12/18/15 0501 12/18/15 0917 12/18/15 2228  TROPONINI 0.08* 0.06* 0.06* 0.04*   BNP (last 3 results) No results for input(s): PROBNP in the last 8760 hours.  CBG:  Recent Labs Lab 12/20/15 1147 12/20/15 1717 12/20/15 2135 12/21/15 0757  12/21/15 1159  GLUCAP 273* 280* 94 240* 221*    Studies: No results found.   Scheduled Meds: . antiseptic oral rinse  7 mL Mouth Rinse BID  . aspirin EC  325 mg Oral Daily  . atorvastatin  40 mg Oral q1800  . carvedilol  6.25 mg Oral BID WC  . divalproex  500 mg Oral Q8H  . docusate sodium  100 mg Oral BID  . enoxaparin (LOVENOX) injection  40 mg Subcutaneous Q24H  . famotidine  20 mg Oral BID  . guaiFENesin  5 mL Oral TID PC & HS  . insulin aspart  0-15 Units Subcutaneous TID WC  . insulin aspart  0-5 Units Subcutaneous QHS  . ipratropium  0.5 mg Nebulization BID  . lacosamide  200 mg Oral BID  . levalbuterol  1.25 mg Nebulization BID  . montelukast  10 mg Oral QHS  . pantoprazole  40 mg Oral BID AC  . phenytoin  100 mg Oral TID  . piperacillin-tazobactam (ZOSYN)  IV  3.375 g Intravenous Q8H  . potassium chloride  40 mEq Oral Once  . potassium chloride  40 mEq Oral BID  . QUEtiapine  50 mg Oral QHS  . sertraline  100 mg Oral Daily   Continuous Infusions:  PRN Meds: acetaminophen **OR** acetaminophen, food thickener, hydrALAZINE, LORazepam  Time spent: 30 minutes  Author: Berle Mull, MD [facility] Pager: [phone] 12/21/2015 2:21 PM  If 7PM-7AM, please contact night-coverage at [email or web address], password [facility]

## 2015-12-21 NOTE — Progress Notes (Signed)
EEG maint complete. No skin breakdown at electrode site FP1, FP2, T4, EKG

## 2015-12-22 LAB — GLUCOSE, CAPILLARY
GLUCOSE-CAPILLARY: 177 mg/dL — AB (ref 65–99)
GLUCOSE-CAPILLARY: 86 mg/dL (ref 65–99)
Glucose-Capillary: 312 mg/dL — ABNORMAL HIGH (ref 65–99)
Glucose-Capillary: 335 mg/dL — ABNORMAL HIGH (ref 65–99)

## 2015-12-22 LAB — COMPREHENSIVE METABOLIC PANEL
ALBUMIN: 3.3 g/dL — AB (ref 3.5–5.0)
ALT: 13 U/L — AB (ref 14–54)
AST: 18 U/L (ref 15–41)
Alkaline Phosphatase: 62 U/L (ref 38–126)
Anion gap: 11 (ref 5–15)
BUN: 17 mg/dL (ref 6–20)
CHLORIDE: 107 mmol/L (ref 101–111)
CO2: 25 mmol/L (ref 22–32)
CREATININE: 0.79 mg/dL (ref 0.44–1.00)
Calcium: 10.2 mg/dL (ref 8.9–10.3)
GFR calc Af Amer: >60 mL/min (ref >60)
GLUCOSE: 180 mg/dL — AB (ref 65–99)
Potassium: 4.7 mmol/L (ref 3.5–5.1)
SODIUM: 143 mmol/L (ref 135–145)
Total Bilirubin: 0.5 mg/dL (ref 0.3–1.2)
Total Protein: 7.3 g/dL (ref 6.5–8.1)

## 2015-12-22 LAB — CBC
HEMATOCRIT: 40.3 % (ref 36.0–46.0)
Hemoglobin: 12.1 g/dL (ref 12.0–15.0)
MCH: 23.9 pg — AB (ref 26.0–34.0)
MCHC: 30 g/dL (ref 30.0–36.0)
MCV: 79.6 fL (ref 78.0–100.0)
PLATELETS: 438 10*3/uL — AB (ref 150–400)
RBC: 5.06 MIL/uL (ref 3.87–5.11)
RDW: 22.7 % — ABNORMAL HIGH (ref 11.5–15.5)
WBC: 7.9 10*3/uL (ref 4.0–10.5)

## 2015-12-22 LAB — PROCALCITONIN

## 2015-12-22 LAB — VALPROIC ACID LEVEL: Valproic Acid Lvl: 62 ug/mL (ref 50.0–100.0)

## 2015-12-22 LAB — PHENYTOIN LEVEL, TOTAL: Phenytoin Lvl: 6.1 ug/mL — ABNORMAL LOW (ref 10.0–20.0)

## 2015-12-22 LAB — MAGNESIUM: MAGNESIUM: 1.9 mg/dL (ref 1.7–2.4)

## 2015-12-22 MED ORDER — LISINOPRIL 20 MG PO TABS
20.0000 mg | ORAL_TABLET | Freq: Every day | ORAL | Status: DC
  Administered 2015-12-22 – 2015-12-23 (×2): 20 mg via ORAL
  Filled 2015-12-22 (×2): qty 1

## 2015-12-22 MED ORDER — DIVALPROEX SODIUM 500 MG PO DR TAB
750.0000 mg | DELAYED_RELEASE_TABLET | Freq: Three times a day (TID) | ORAL | Status: DC
Start: 2015-12-22 — End: 2015-12-23
  Administered 2015-12-22 – 2015-12-23 (×3): 750 mg via ORAL
  Filled 2015-12-22 (×4): qty 1

## 2015-12-22 MED ORDER — PHENYTOIN SODIUM EXTENDED 30 MG PO CAPS
150.0000 mg | ORAL_CAPSULE | Freq: Three times a day (TID) | ORAL | Status: DC
  Administered 2015-12-22: 150 mg via ORAL
  Filled 2015-12-22 (×2): qty 5

## 2015-12-22 MED ORDER — PHENYTOIN 125 MG/5ML PO SUSP
150.0000 mg | Freq: Three times a day (TID) | ORAL | Status: DC
  Administered 2015-12-22 – 2015-12-23 (×3): 150 mg via ORAL
  Filled 2015-12-22 (×4): qty 8

## 2015-12-22 MED ORDER — SODIUM CHLORIDE 0.9 % IV SOLN
10.0000 mg/kg | Freq: Once | INTRAVENOUS | Status: AC
  Administered 2015-12-22: 661 mg via INTRAVENOUS
  Filled 2015-12-22: qty 13.22

## 2015-12-22 NOTE — Progress Notes (Signed)
Spoke with pt's spouse [name] and updated him on pt's condition, he stated he would visit tomorrow 12/23/15. Consuelo Pandy RN

## 2015-12-22 NOTE — Progress Notes (Signed)
[facility] Progress Note  Patient: Jocelyn Burgess [id number]   PCP: Bobetta Lime, MD DOB: [birth year]   DOA: 12/17/2015   DOS: 12/22/2015   Date of Service: the patient was seen and examined on 12/22/2015  Subjective:Agitation has improved. Remains confused. Denies any acute complaint. No acute event identified overnight. Continuous EEG monitoring discontinued. Nutrition: tolerating oral diet  Brief hospital course: Pt. with PMH of PE, dementia, COPD, on 2 L at home, HTN, DM 2, breast cancer; admitted on 12/17/2015, with complaint of seizure followed by Todd's paralysis, was found to have seizure, CVA, possible pneumonia. Currently further plan is continue supportive management.  Assessment and Plan: 1. Seizure ([place]) EEG is positive for right temporal lobe seizure focus. CT head without any acute abnormality. started on Vimpat as well as Dilantin 100 mg 3 times a day. Depakote added on 12/21/2015. We will get MRI brain with and without contrast once stable. Likely on 12/23/2015 Monitor closely for aspiration event as well as recurrent seizures. Currently having long-term EEG monitoring. Prognosis is guarded, but improving with improvement in oxygenation at present  2. Acute left posterior frontal infarct 5 mm size. Patient on Xarelto at home for DVT which was 3 months ago. Currently on aspirin only due to concern of GI bleed. Neurology following. Continue Lipitor. Continue NIHS. PTOT and speech consultation.  3. Aspiration pneumonia, healthcare associated. Acute on chronic hypoxic respiratory failure. COPD with chronic respiratory failure and 2 L of oxygen. Acute on chronic suspected diastolic dysfunction.  The patient is a resident of nursing home. Comes in with seizures as well as acute encephalopathy. Speech therapy recommends dysphagia type I diet. CT chest with contrast shows evidence of possible pulmonary edema as well as right lower lobe consolidation and pleural  effusion. No significant wheezing on examination. Continue duo nebs, added Mucinex,add chest PT Low threshold to use BiPAP for respiratory distress. Initially patient was on Unasyn antibiotic changed to Zosyn. Patient received IV Lasix 2 with significant improvement in respiratory status.  4. Acute encephalopathy. Multifactorial in the setting of ongoing seizure activity as well as daily ADL with infection. Given that the patient is more hypoxic currently I will discontinue Aricept, reduce the dose of Seroquel from 50 mg twice a day to 50 mg daily, discontinue clonidine 0.1 mg twice a day and use hydralazine.  5. Symptomatic anemia. Status post 1 PRBC on 12/18/2015 as well as Feraheme 1. GI consulted who recommended no acute intervention at present. Xarelto is currently on hold. Recommend to discontinue it since no acute DVT identified. H&H remain stable will continue to monitor.  6. History of pulmonary embolism, chronic DVT on vascular Doppler On anticoagulation at home which is currently on hold due to hemoptysis and GI bleed concern. Continue aspirin. Resume DVT prophylaxis on 12/20/2015.  7. Essential hypertension. Blood pressure initially was elevated currently allowing permissive hypertension in the setting of CVA. Clonidine twice a day is discontinued due to low blood pressure as well as psychotropic effect. Can use when necessary hydralazine. Adding Coreg twice a day.  8. Hypokalemia. Replacing.  9. diabetes mellitus. Continue sliding scale.  10.goals of care discussion. I discussed with patient's husband regarding patient's poor prognosis given her acute encephalopathy as well as ongoing dysphagia and a possible aspiration pneumonia requiring significant amount of oxygen. Husband is hopeful for improvement in patient's condition once a seizure is better. We'll continue to monitor the patient's course over the weekend. Discussed with husband on  Pain management:  continue Tylenol  when necessary Activity: SNF as per physical therapy Bowel regimen: last BM 12/18/2015 stool softener added Diet: Dysphagia type I, cardiac and carb modified diet.  DVT Prophylaxis: subcutaneous heparin  Advance goals of care discussion: DNR/DNI  Family Communication: no family was present at bedside, at the time of interview.   Disposition:  Discharge to SNF once stable. Expected discharge date: 12/26/2015  Consultants: Neurology, gastroenterology Procedures: EEG  Antibiotics: Anti-infectives    Start     Dose/Rate Route Frequency Ordered Stop   12/19/15 1800  piperacillin-tazobactam (ZOSYN) IVPB 3.375 g     3.375 g 12.5 mL/hr over 240 Minutes Intravenous Every 8 hours 12/19/15 1751     12/18/15 1000  Ampicillin-Sulbactam (UNASYN) 3 g in sodium chloride 0.9 % 100 mL IVPB  Status:  Discontinued     3 g 100 mL/hr over 60 Minutes Intravenous Every 8 hours 12/18/15 0952 12/19/15 1742        Intake/Output Summary (Last 24 hours) at 12/22/15 1648 Last data filed at 12/22/15 1300  Gross per 24 hour  Intake    330 ml  Output      0 ml  Net    330 ml   Filed Weights   12/18/15 1100  Weight: 66.1 kg (145 lb 11.6 oz)    Objective: Physical Exam: Filed Vitals:   12/22/15 0756 12/22/15 0816 12/22/15 1155 12/22/15 1502  BP:  188/87 170/96 150/73  Pulse:  95 93 98  Temp:  98.2 F (36.8 C) 98.4 F (36.9 C) 98.1 F (36.7 C)  TempSrc:  Oral Oral Axillary  Resp:  22 18 20   Height:      Weight:      SpO2: 98% 95% 95% 93%   General: Alert, Awake and Disoriented Time, Place and Person. Appear in moderate distress Eyes: PERRL, Conjunctiva normal ENT: Oral Mucosa clear moist. Neck: difficult to assess JVD, no Abnormal Mass Or lumps Cardiovascular: S1 and S2 Present, aortic systolic Murmur, Respiratory: Bilateral Air entry equal and Decreased, bilateral Crackles, no wheezes Abdomen: Bowel Sound present, Soft and no tenderness Skin: no redness, no Rash   Extremities: no Pedal edema, no calf tenderness Neurologic: Left-sided weakness, Showing improvement. Complete examination is difficult due to confusion  Data Reviewed: CBC:  Recent Labs Lab 12/17/15 1811  12/18/15 0917  12/19/15 0539 12/19/15 0954 12/19/15 1830 12/20/15 0130 12/21/15 0448 12/22/15 0718  WBC 9.1  --  7.5  --  8.3  --   --  10.4 10.6* 7.9  NEUTROABS 7.3  --   --   --   --   --   --  8.1* 8.0*  --   HGB 6.5*  < > 7.8*  < > 9.3* 9.1* 9.1* 9.6*  9.6* 11.6* 12.1  HCT 23.1*  < > 26.2*  < > 30.8* 29.6* 29.8* 32.3*  32.0* 37.9 40.3  MCV 73.3*  --  75.1*  --  76.4*  --   --  76.5* 76.9* 79.6  PLT 452*  --  455*  --  365  --   --  450* 541* 438*  < > = values in this interval not displayed. Basic Metabolic Panel:  Recent Labs Lab 12/17/15 2220 12/18/15 0917 12/19/15 0539 12/20/15 0130 12/21/15 0448 12/22/15 0718  NA  --  135 136 136 139 143  K  --  3.4* 3.0* 3.0* 2.9* 4.7  CL  --  102 95* 100* 100* 107  CO2  --  25 26 26 26  25  GLUCOSE  --  140* 138* 254* 196* 180*  BUN  --  12 7 7 9 17   CREATININE  --  0.58 0.71 0.60 0.61 0.79  CALCIUM  --  8.8* 8.9 9.3 9.8 10.2  MG 1.3*  --   --  1.5* 1.8 1.9  PHOS 3.0  --   --   --   --   --     Liver Function Tests:  Recent Labs Lab 12/18/15 0917 12/19/15 0539 12/20/15 0130 12/21/15 0448 12/22/15 0718  AST 20 21 18 16 18   ALT 15 15 13* 12* 13*  ALKPHOS 51 55 61 72 62  BILITOT 0.8 1.4* 0.7 1.0 0.5  PROT 6.8 6.7 6.6 7.4 7.3  ALBUMIN 3.4* 3.3* 3.2* 3.3* 3.3*    Recent Labs Lab 12/18/15 0917  LIPASE 18   No results for input(s): AMMONIA in the last 168 hours. Coagulation Profile:  Recent Labs Lab 12/17/15 1811 12/19/15 0539 12/20/15 0130 12/21/15 0448  INR 2.70* 1.33 1.32 1.20   Cardiac Enzymes:  Recent Labs Lab 12/17/15 2332 12/18/15 0501 12/18/15 0917 12/18/15 2228  TROPONINI 0.08* 0.06* 0.06* 0.04*   BNP (last 3 results) No results for input(s): PROBNP in the last 8760  hours.  CBG:  Recent Labs Lab 12/21/15 1159 12/21/15 1533 12/21/15 2107 12/22/15 0818 12/22/15 1156  GLUCAP 221* 146* 140* 177* 312*    Studies: No results found.   Scheduled Meds: . antiseptic oral rinse  7 mL Mouth Rinse BID  . aspirin EC  325 mg Oral Daily  . atorvastatin  40 mg Oral q1800  . carvedilol  6.25 mg Oral BID WC  . divalproex  750 mg Oral Q8H  . docusate sodium  100 mg Oral BID  . enoxaparin (LOVENOX) injection  40 mg Subcutaneous Q24H  . famotidine  20 mg Oral BID  . guaiFENesin  5 mL Oral TID PC & HS  . insulin aspart  0-15 Units Subcutaneous TID WC  . insulin aspart  0-5 Units Subcutaneous QHS  . ipratropium  0.5 mg Nebulization BID  . lacosamide  200 mg Oral BID  . levalbuterol  1.25 mg Nebulization BID  . lisinopril  20 mg Oral Daily  . montelukast  10 mg Oral QHS  . pantoprazole  40 mg Oral BID AC  . phenytoin  150 mg Oral TID  . piperacillin-tazobactam (ZOSYN)  IV  3.375 g Intravenous Q8H  . polyethylene glycol  17 g Oral Daily  . potassium chloride  40 mEq Oral BID  . QUEtiapine  50 mg Oral QHS  . sertraline  100 mg Oral Daily   Continuous Infusions:  PRN Meds: acetaminophen **OR** acetaminophen, food thickener, hydrALAZINE, LORazepam  Time spent: 30 minutes  Author: Berle Mull, MD [facility] Pager: [phone] 12/22/2015 4:48 PM  If 7PM-7AM, please contact night-coverage at [email or web address], password [facility]

## 2015-12-22 NOTE — Progress Notes (Signed)
STROKE TEAM PROGRESS NOTE   SUBJECTIVE (INTERVAL HISTORY) Patient sitting in bed, seems tired and exhausted. Just finished cleaning and changing. LTM EEG at bedside. No seizure but still right frequent sharps. Dilantin level low, will give loading. depakote level at lower end of therapeutic range, will increase dilantin and depakote dose. Will not chase EEG tracings, d/c LTM EEG.   OBJECTIVE Temp:  [97.1 F (36.2 C)-99.1 F (37.3 C)] 97.6 F (36.4 C) (07/08 0309) Pulse Rate:  [28-132] 90 (07/08 0309) Cardiac Rhythm:  [-] Normal sinus rhythm (07/07 2000) Resp:  [16-33] 20 (07/08 0309) BP: (124-197)/(58-95) 166/80 mmHg (07/08 0309) SpO2:  [87 %-98 %] 92 % (07/08 0309)   Recent Labs Lab 12/20/15 2135 12/21/15 0757 12/21/15 1159 12/21/15 1533 12/21/15 2107  GLUCAP 94 240* 221* 146* 140*    Recent Labs Lab 12/17/15 1811 12/17/15 1819 12/17/15 2220 12/18/15 0917 12/19/15 0539 12/20/15 0130 12/21/15 0448  NA 134* 137  --  135 136 136 139  K 2.5* 2.6*  --  3.4* 3.0* 3.0* 2.9*  CL 101 99*  --  102 95* 100* 100*  CO2 19*  --   --  25 26 26 26   GLUCOSE 269* 265*  --  140* 138* 254* 196*  BUN 13 13  --  12 7 7 9   CREATININE 0.76 0.50  --  0.58 0.71 0.60 0.61  CALCIUM 9.1  --   --  8.8* 8.9 9.3 9.8  MG  --   --  1.3*  --   --  1.5* 1.8  PHOS  --   --  3.0  --   --   --   --     Recent Labs Lab 12/17/15 1811 12/18/15 0917 12/19/15 0539 12/20/15 0130 12/21/15 0448  AST 21 20 21 18 16   ALT 17 15 15  13* 12*  ALKPHOS 59 51 55 61 72  BILITOT 0.4 0.8 1.4* 0.7 1.0  PROT 7.3 6.8 6.7 6.6 7.4  ALBUMIN 3.4* 3.4* 3.3* 3.2* 3.3*    Recent Labs Lab 12/17/15 1811  12/18/15 0917  12/19/15 0539 12/19/15 0954 12/19/15 1830 12/20/15 0130 12/21/15 0448 12/22/15 0718  WBC 9.1  --  7.5  --  8.3  --   --  10.4 10.6* 7.9  NEUTROABS 7.3  --   --   --   --   --   --  8.1* 8.0*  --   HGB 6.5*  < > 7.8*  < > 9.3* 9.1* 9.1* 9.6*  9.6* 11.6* 12.1  HCT 23.1*  < > 26.2*  < > 30.8*  29.6* 29.8* 32.3*  32.0* 37.9 40.3  MCV 73.3*  --  75.1*  --  76.4*  --   --  76.5* 76.9* 79.6  PLT 452*  --  455*  --  365  --   --  450* 541* 438*  < > = values in this interval not displayed.  Recent Labs Lab 12/17/15 2332 12/18/15 0501 12/18/15 0917 12/18/15 2228  TROPONINI 0.08* 0.06* 0.06* 0.04*    Recent Labs  12/20/15 0130 12/21/15 0448  LABPROT 16.5* 15.4*  INR 1.32 1.20    Recent Labs  12/19/15 1827  COLORURINE YELLOW  LABSPEC 1.046*  PHURINE 7.0  GLUCOSEU NEGATIVE  HGBUR NEGATIVE  BILIRUBINUR SMALL*  KETONESUR 15*  PROTEINUR NEGATIVE  NITRITE NEGATIVE  LEUKOCYTESUR NEGATIVE       Component Value Date/Time   CHOL 103 12/18/2015 0501   TRIG 111 12/18/2015 0501  HDL 44 12/18/2015 0501   CHOLHDL 2.3 12/18/2015 0501   VLDL 22 12/18/2015 0501   LDLCALC 37 12/18/2015 0501   Lab Results  Component Value Date   HGBA1C 6.3* 12/18/2015      Component Value Date/Time   LABOPIA NONE DETECTED 12/17/2015 2028   LABOPIA POSITIVE* 08/26/2015 0514   COCAINSCRNUR NONE DETECTED 12/17/2015 2028   COCAINSCRNUR NONE DETECTED 08/26/2015 0514   LABBENZ NONE DETECTED 12/17/2015 2028   LABBENZ POSITIVE* 08/26/2015 0514   AMPHETMU NONE DETECTED 12/17/2015 2028   AMPHETMU NONE DETECTED 08/26/2015 0514   THCU NONE DETECTED 12/17/2015 2028   THCU NONE DETECTED 08/26/2015 0514   LABBARB NONE DETECTED 12/17/2015 2028   LABBARB NONE DETECTED 08/26/2015 0514     Recent Labs Lab 12/17/15 1811  ETH <5   IMAGING I have personally reviewed the radiological images below and agree with the radiology interpretations.  CXR 12/19/2015 Enlargement of cardiac silhouette with pulmonary vascular Congestion. Persistent pulmonary edema with bibasilar atelectasis and small RIGHT pleural effusion. Aortic atherosclerosis.  Ct Chest W Contrast 12/18/2015  IMPRESSION: Development of bilateral pleural effusions right larger than left with dependent atelectasis bilaterally and near  complete collapse of the right lower lobe. Interstitial and alveolar pattern suggesting fluid overload/ pulmonary edema diffusely. Multiple spiculated densities in the lower lobes have done smaller/retracted somewhat since the study of May. This favors inflammatory disease. Tumor hidden amongst this is not excluded. Right hilar lymphadenopathy persists, difficult to measure accurately because of the right lower lobe collapse. Note that the right lower lobe bronchus is now full of either mucous, pus or tumor. Enlargement of a pretracheal lymph node image 21. Previously this measured 12 mm. Today it measured 19 mm. 8   Mr Brain Wo Contrast 12/18/2015 IMPRESSION: Prematurely truncated scan due to lack of patient cooperation. Acute LEFT posterior frontal cortical infarct, 5 mm in size. This appears nonhemorrhagic. Atrophy and small vessel disease.   Ct Abdomen Pelvis W Contrast 12/18/2015  IMPRESSION: No significant finding in the abdomen or pelvis of an acute nature. Aortic atherosclerosis. Previously seen T12 fracture which has not progressed.   Ct Head Code Stroke W/o Cm 12/17/2015 IMPRESSION: No acute intracranial abnormality. Atrophy, chronic microvascular disease.   EEG This is an abnormal EEG due to mild diffuse generalized slowing of the background. This is consistent with the patient's known history of dementia.  She had a single electrographic seizure that appears to start in the right temporal region with rapid generalization. This lasted about 50 seconds in total and was associated clinically with versive left head turn and unresponsiveness.   CTA head and neck 12/19/2015  1. Negative for emergent large vessel occlusion or hemodynamically significant carotid or anterior circulation stenosis.  2. Positive for severe atherosclerosis of the aortic arch, including several areas of ulcerated plaque. 3. Positive for posterior circulation stenoses:  - moderate to severe Left vertebral artery origin  stenosis, - mild to moderate distal Right vertebral artery and proximal Left PCA stenoses. 4. Stable CT appearance of the brain. 5. Persistent small pleural effusions with suspected pulmonary edema superimposed on emphysema.  2D Echocardiogram  - Left ventricle: The cavity size was normal. Systolic function was normal. Wall motion was normal; there were no regional wall motion abnormalities. Features are consistent with a pseudonormal left ventricular filling pattern, with concomitant abnormal relaxation and increased filling pressure (grade 2 diastolic dysfunction). - Aortic valve: Trileaflet; mildly thickened, mildly calcified leaflets. - Mitral valve: Calcified annulus. There was mild regurgitation. -  Pulmonary arteries: PA peak pressure: 75 mm Hg (S). Impressions:   The right ventricular systolic pressure was increased consistent with severe pulmonary hypertension.  LE venous doppler - There is no acute DVT or SVT noted in the bilateral lower extremities. There appears to be chronic DVT noted in the left femoral vein.  LTM EEG  12/22/15 This is an abnormal EEG due to the presence of the following: 1) Generalized polymorphic delta and theta slowing, suggesting moderate to severe encephalopathy; 2) Frequent focal epileptiform discharges in the right posterior region, suggesting risk of focal epileptogenicity. Compared with the last study, current study showed no significant changes.  12/21/15 This is an abnormal EEG due to the presence of the following: 1) Generalized polymorphic delta and theta slowing, suggesting moderate to severe encephalopathy; 2) Frequent focal epileptiform discharges in the right posterior region, suggesting risk of focal epileptogenicity. Compared with the last study, current study showed slightly improved background activity but the right-sided epileptiform discharges were essentially unchanged.  12/20/15 - This is an abnormal EEG due to the presence of the  following: 1) Generalized polymorphic delta slowing, suggesting severe encephalopathy; 2) Frequent focal epileptiform discharges in the right posterior region, suggesting risk of focal epileptogenicity.   PHYSICAL EXAM General - Well nourished, well developed, in no apparent distress.  Ophthalmologic - fundi not visualized due to noncooperation.  Cardiovascular - Regular rate and rhythm with no murmur.  Neck - supple, no carotid bruits  Mental Status -  Sleepy but easily arousable, able to tell her name, but disorientated to place, people, age and time.  Confused, not following simple commands, able to say simple words or short sentences, no dysarthria. Perseveration on her name. Naming and repetition not cooperative.   Cranial Nerves II - XII - II - inconsistently blinking to visual threat bilaterally III, IV, VI - Extraocular movements intact. V - Facial sensation intact bilaterally. VII - Facial movement intact bilaterally VIII - Hearing & vestibular intact bilaterally. X - Palate elevates symmetrically. XI - Chin turning & shoulder shrug intact bilaterally. XII - Tongue protrusion intact.  Motor Strength - The patient's strength was RUEs at least 4/5 but LUE proximal 4/5 and distal 3/5 and 3-/5 RLE and 3-/5 LLE.  Bulk was normal and fasciculations were absent.   Motor Tone - Muscle tone was assessed at the neck and appendages and was normal.  Reflexes - The patient's reflexes were symmetrical in all extremities and she no babinski.  Sensory - not cooperative.    Coordination - not cooperative but no asterixis seen today. Intermittent BLE and RUE myoclonus-like movement.  Gait and Station - not tested.   ASSESSMENT/PLAN Ms. LEVEE GARROD is a 82 y.o. female with history of DM, COPD, HTN, HLD, depression, dementia, PE in 07/2015 on Xarelto but currently on hold due to anemia admitted for GTC seizure. she has continued left UE and LE weakness, and EEG showed seizure coming  out from right temporal region. MRI showed left CR/SO small infarct which is incidental finding and not correlating to her symptoms.    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, improving  MRI - not tolerating for seizure work up   CT without contrast negative  EEG - seizure originating from right temporal lobe  LTM EEG - frequent right posterior epileptiform discharges, no changes for 3 days  Dilantin corrected level 8 (6.1 this am), sub-therapeutic - 600mg  fosphenytoin load  Increase dilantin to 150mg  tid  Daily dilantin and depakote level  Continue vimpat to 200mg  bid  Depakote level 62, lower end of therapeutic range  increase depakote to 750mg  tid  Will not chase EEG tracing anymore, d/c LTM EEG  Will do spot EEG on monday  Pt will need MRI with and without contrast if able to tolerate MRI in the future depending on goal of care  Need to discuss with husband regarding future plan of care.  Consider LP if develop fever or worsening neuro status  Stroke: incidental left frontal subcortical (CR/SO) infarct - likely due to small vessel disease, not correlating to current symptoms.   MRI left small CR/SO infarct  CTA head and neck - no large vessel occlusion  2D Echo  Severe pulmonary hypertension  LE venous doppler no acute DVT but left old DVT  LDL 37  HgbA1c 6.3  SCDs for VTE prophylaxis  DIET - DYS 1 Room service appropriate?: Yes; Fluid consistency:: Nectar Thick   Xarelto (rivaroxaban) daily prior to admission, now on aspirin 325 mg daily. Pt has severe anemia requiring blood transfusion. From stroke standpoint, Xarelto is likely not needed as the infarct likely due to small vessel disease. Also Xarelto may put pt on high risk of bleeding, further worsen anemia.   Ongoing aggressive stroke risk factor management  Therapy recommendations:  SNF  Disposition:  Pending   Anemia  Hb 6.5 -> 9.3-> 9.6->12.1  S/p blood transfusion  FOB  positive  Xarelto discontinued due to anemia needing blood transfusion and FOB positive  B/l pleural effusion and RLL atelectasis   Underlying tumor not excluded  Management as per primary team  On unasyn  May need tumor work up once stabilized depending on goal of care  History of PE on Xarelto  PE in 07/2015 on Xarelto since  LE venous doppler no acute DVT but old left DVT  Xarelto discontinued due to anemia requiring transfusionPt had 4-5 months of Xarelto already, may consider d/c Xarelto anyway given no acute DVT this time.  Pt already had 4-5 months of Xarelto, may consider d/c Xarelto anyway given no acute DVT this time.  Diabetes  HgbA1c 6.3 goal < 7.0  Controlled  Hypertension  Home meds:   clonidine  BP goal normotensive now  Currently on coreg  Add lisinopril   Still at high end  Hyperlipidemia  Home meds:  lipitor    Currently on lipitor  LDL 37, goal < 70  Continue statin at discharge  Other Stroke Risk Factors  Advanced age  Other Active Problems  Dementia on aricept and seroquel. seroquel decreased to q hs yesterdya.  Depression on zoloft  Other Pertinent History  COPD  ? Cancer history  Hospital day # 5  Rosalin Hawking, MD PhD Stroke Neurology 12/22/2015 10:04 AM   To contact Stroke Continuity provider, please refer to [email or web address] After hours, contact General Neurology

## 2015-12-22 NOTE — Procedures (Signed)
Continuous Video-EEG Monitoring Report    Patient:   Jocelyn Burgess, Jocelyn Burgess             EEG No.ID:   [id number]             DOB:   Nov 26, 1931   Age:   82   Room#3S05    MED REC NO:   [id number]   Gender:   Female       TECH:   Estil Daft             Physician:   Winfield Cunas             Referring Physician:   Rosalin Hawking             Report Date:   12/22/2015              Study Duration:         12/21/2015 07:30 to 12/22/2015 07:30 CPT Code:                 [id number] Diagnosis:                 Seizures (R56.9); Altered mental status (R41.82)   History: This is an 82 year old female presenting with seizures and altered mental status.  Video-EEG monitoring was performed to evaluate for seizures.  Technical Details:  Long-term video-EEG monitoring was performed using standard setting per the guidelines.  Briefly, a minimum of 21 electrodes were placed on scalp according to the International 10-20 or/and 10-10 Systems.  Supplemental electrodes were placed as needed.  Single EKG electrode was also used to detect cardiac arrhythmia.  Patient's behavior was continuously recorded on video simultaneously with EEG.  A minimum of 16 channels were used for data display.  Each epoch of study was reviewed manually daily and as needed using standard referential and bipolar montages.    EEG Description:  There was generalized polymorphic delta and theta slowing.  Intermittent sharp waves were present in the right posterior region (P4, T6, O2), occurring in a semi-periodic fashion.  Diffuse beta activity was seen, likely due to medication effect (such as benzodiazepine, propofol).  No posterior dominant rhythm or sleep architecture was present.  No seizure was captured.  Impression:  This is an abnormal EEG due to the presence of the following: 1) Generalized polymorphic delta and theta slowing, suggesting moderate to severe encephalopathy; 2) Frequent focal epileptiform discharges in the right posterior region, suggesting risk of  focal epileptogenicity.  Compared with the last study, current study showed no significant changes.                                                                              Reading Physician: Winfield Cunas, MD, PhD

## 2015-12-23 ENCOUNTER — Inpatient Hospital Stay (HOSPITAL_COMMUNITY): Payer: Medicare Other

## 2015-12-23 DIAGNOSIS — Z7189 Other specified counseling: Secondary | ICD-10-CM

## 2015-12-23 DIAGNOSIS — K922 Gastrointestinal hemorrhage, unspecified: Secondary | ICD-10-CM

## 2015-12-23 DIAGNOSIS — J9601 Acute respiratory failure with hypoxia: Secondary | ICD-10-CM

## 2015-12-23 DIAGNOSIS — Z515 Encounter for palliative care: Secondary | ICD-10-CM

## 2015-12-23 LAB — CULTURE, BLOOD (ROUTINE X 2)
CULTURE: NO GROWTH
CULTURE: NO GROWTH

## 2015-12-23 LAB — CBC
HEMATOCRIT: 42.8 % (ref 36.0–46.0)
HEMOGLOBIN: 12.4 g/dL (ref 12.0–15.0)
MCH: 23.5 pg — ABNORMAL LOW (ref 26.0–34.0)
MCHC: 29 g/dL — AB (ref 30.0–36.0)
MCV: 81.2 fL (ref 78.0–100.0)
Platelets: 544 10*3/uL — ABNORMAL HIGH (ref 150–400)
RBC: 5.27 MIL/uL — ABNORMAL HIGH (ref 3.87–5.11)
RDW: 23.8 % — AB (ref 11.5–15.5)
WBC: 11.6 10*3/uL — AB (ref 4.0–10.5)

## 2015-12-23 LAB — COMPREHENSIVE METABOLIC PANEL
ALBUMIN: 3.1 g/dL — AB (ref 3.5–5.0)
ALT: 12 U/L — ABNORMAL LOW (ref 14–54)
AST: 15 U/L (ref 15–41)
Alkaline Phosphatase: 60 U/L (ref 38–126)
Anion gap: 10 (ref 5–15)
BUN: 17 mg/dL (ref 6–20)
CHLORIDE: 111 mmol/L (ref 101–111)
CO2: 24 mmol/L (ref 22–32)
Calcium: 10.4 mg/dL — ABNORMAL HIGH (ref 8.9–10.3)
Creatinine, Ser: 0.76 mg/dL (ref 0.44–1.00)
GFR calc Af Amer: >60 mL/min (ref >60)
GFR calc non Af Amer: >60 mL/min (ref >60)
GLUCOSE: 256 mg/dL — AB (ref 65–99)
POTASSIUM: 4 mmol/L (ref 3.5–5.1)
SODIUM: 145 mmol/L (ref 135–145)
Total Bilirubin: 0.5 mg/dL (ref 0.3–1.2)
Total Protein: 7.5 g/dL (ref 6.5–8.1)

## 2015-12-23 LAB — GLUCOSE, CAPILLARY: Glucose-Capillary: 265 mg/dL — ABNORMAL HIGH (ref 65–99)

## 2015-12-23 LAB — MAGNESIUM: MAGNESIUM: 1.8 mg/dL (ref 1.7–2.4)

## 2015-12-23 LAB — VALPROIC ACID LEVEL: Valproic Acid Lvl: 42 ug/mL — ABNORMAL LOW (ref 50.0–100.0)

## 2015-12-23 LAB — PHENYTOIN LEVEL, TOTAL: Phenytoin Lvl: 15.7 ug/mL (ref 10.0–20.0)

## 2015-12-23 MED ORDER — HALOPERIDOL 0.5 MG PO TABS
0.5000 mg | ORAL_TABLET | ORAL | Status: DC | PRN
  Filled 2015-12-23: qty 1

## 2015-12-23 MED ORDER — HALOPERIDOL LACTATE 2 MG/ML PO CONC
0.5000 mg | ORAL | Status: DC | PRN
  Filled 2015-12-23: qty 0.3

## 2015-12-23 MED ORDER — HALOPERIDOL LACTATE 5 MG/ML IJ SOLN: 0.5000 mg | INTRAMUSCULAR | Status: DC | PRN

## 2015-12-23 MED ORDER — GLYCOPYRROLATE 0.2 MG/ML IJ SOLN
0.2000 mg | INTRAMUSCULAR | Status: DC | PRN
  Administered 2015-12-24: 0.2 mg via INTRAVENOUS
  Filled 2015-12-23: qty 1

## 2015-12-23 MED ORDER — SODIUM CHLORIDE 0.9 % IV SOLN
150.0000 mg | Freq: Three times a day (TID) | INTRAVENOUS | Status: DC
  Administered 2015-12-23 – 2015-12-24 (×4): 150 mg via INTRAVENOUS
  Filled 2015-12-23 (×7): qty 3

## 2015-12-23 MED ORDER — GLYCOPYRROLATE 0.2 MG/ML IJ SOLN
0.2000 mg | INTRAMUSCULAR | Status: DC | PRN
Start: 2015-12-23 — End: 2015-12-24

## 2015-12-23 MED ORDER — GLYCOPYRROLATE 1 MG PO TABS
1.0000 mg | ORAL_TABLET | ORAL | Status: DC | PRN
  Filled 2015-12-23: qty 1

## 2015-12-23 MED ORDER — ATROPINE SULFATE 1 % OP SOLN
3.0000 [drp] | OPHTHALMIC | Status: DC | PRN
  Filled 2015-12-23: qty 2

## 2015-12-23 MED ORDER — BISACODYL 10 MG RE SUPP: 10.0000 mg | Freq: Every day | RECTAL | Status: DC | PRN

## 2015-12-23 MED ORDER — MORPHINE SULFATE (PF) 2 MG/ML IV SOLN
2.0000 mg | INTRAVENOUS | Status: DC | PRN
  Administered 2015-12-23: 2 mg via INTRAVENOUS
  Filled 2015-12-23: qty 1

## 2015-12-23 MED ORDER — MORPHINE SULFATE (PF) 2 MG/ML IV SOLN
2.0000 mg | INTRAVENOUS | Status: DC | PRN
  Administered 2015-12-24: 4 mg via INTRAVENOUS
  Administered 2015-12-24: 2 mg via INTRAVENOUS
  Filled 2015-12-23: qty 1
  Filled 2015-12-23: qty 2

## 2015-12-23 MED ORDER — VALPROATE SODIUM 500 MG/5ML IV SOLN
750.0000 mg | Freq: Three times a day (TID) | INTRAVENOUS | Status: DC
  Administered 2015-12-23 – 2015-12-24 (×3): 750 mg via INTRAVENOUS
  Filled 2015-12-23 (×6): qty 7.5

## 2015-12-23 NOTE — Progress Notes (Signed)
Paged dr patel, pt started coughing after medicine given. ?aspiration, lung sounds coarse, cxr ordered, pt mentation difference, orders rec'd to NTS. NTS for small amount yellow secretions. MD at bedside to assess.

## 2015-12-23 NOTE — Progress Notes (Signed)
[facility] Progress Note  Patient: Jocelyn Burgess [id number]   PCP: Bobetta Lime, MD DOB: 1932/01/13   DOA: 12/17/2015   DOS: 12/23/2015   Date of Service: the patient was seen and examined on 12/23/2015  Subjective: This morning the patient was not responding to verbal command and appeared in significant respiratory distress. As per the RN the patient may have aspirated. Husband was at bedside. Nutrition: tolerating oral diet  Brief hospital course: Pt. with PMH of PE, dementia, COPD, on 2 L at home, HTN, DM 2, breast cancer; admitted on 12/17/2015, with complaint of seizure followed by Todd's paralysis, was found to have seizure, CVA, pneumonia. Patient was started on continuous EEG monitoring and triple AED were initiated. The patient started becoming significantly lethargic progressively and despite that patient's active seizure spikes were not being controlled. Patient started having aspiration event with significant respiratory distress at which time it was discussed with patient's husband and decided to transition patient's care to complete comfort. Palliative care was consulted. Currently further plan is continue comfort care.  Assessment and Plan: 1. Seizure ([place]) EEG is positive for right temporal lobe seizure focus. CT head without any acute abnormality. started on Vimpat as well as Dilantin 100 mg 3 times a day. Depakote added on 12/21/2015.  2. Acute left posterior frontal infarct 5 mm size. Patient on Xarelto at home for DVT which was 3 months ago. Initially was on aspirin only due to concern of GI bleed. Neurology following. PTOT and speech consultation.  3. Aspiration pneumonia, healthcare associated. Acute on chronic hypoxic respiratory failure. COPD with chronic respiratory failure and 2 L of oxygen. Acute on chronic suspected diastolic dysfunction.  The patient is a resident of nursing home. Comes in with seizures as well as acute encephalopathy. Speech  therapy recommends dysphagia type I diet. CT chest with contrast shows evidence of possible pulmonary edema as well as right lower lobe consolidation and pleural effusion. Treated with duo nebs, added Mucinex,add chest PT Initially patient was on Unasyn antibiotic changed to Zosyn. Patient received IV Lasix 2 with improvement in respiratory status. On 12/23/2015 the patient had another aspiration event requiring 6 L of oxygen and also in significant respiratory distress. At that point after discussion with patient's husband it was decided to transition patient's care to complete comfort.  4. Acute encephalopathy. Multifactorial in the setting of ongoing seizure activity as well as daily ADL with infection. discontinue Aricept, Seroquel, clonidine.  5. Symptomatic anemia. Status post 1 PRBC on 12/18/2015 as well as Feraheme 1. GI consulted who recommended no acute intervention at present. Xarelto is currently on hold. Recommend to discontinue it since no acute DVT identified. H&H remain stable.  6. History of pulmonary embolism, chronic DVT on vascular Doppler On anticoagulation at home which is currently on hold due to hemoptysis and GI bleed concern. Resumed DVT prophylaxis on 12/20/2015.  7. Essential hypertension. Blood pressure initially was elevated currently allowing permissive hypertension in the setting of CVA. Clonidine twice a day is discontinued due to low blood pressure as well as psychotropic effect.  8. Hypokalemia. Resolved  9. diabetes mellitus. A1c 6.3 on 12/18/2015.  10.goals of care discussion. Discussed with husband on 12/23/2015 after an aspiration event. The patient's significant respiratory distress and no meaningful improvement in patient's mentation despite ongoing antiepileptic medication husband decided to transition patient's care to complete comfort. Discussed with neurology and they were also in agreement. Discussed with palliative care and  appreciate their assistance in patient's symptom management.  Pain management: continue Tylenol when necessary, when necessary morphine, when necessary Ativan Activity: Comfort care Bowel regimen: last BM 12/18/2015 stool softener added Diet: Comfort feed DVT Prophylaxis: Comfort care  Advance goals of care discussion: DNR/DNI, transition to full comfort  Family Communication: family was present at bedside, at the time of interview.   Disposition:  Discharge to inpatient hospice although expecting in-hospital death. Expected discharge date: 01-09-2016  Consultants: Neurology, gastroenterology, palliative care Procedures: EEG  Antibiotics: Anti-infectives    Start     Dose/Rate Route Frequency Ordered Stop   12/19/15 1800  piperacillin-tazobactam (ZOSYN) IVPB 3.375 g  Status:  Discontinued     3.375 g 12.5 mL/hr over 240 Minutes Intravenous Every 8 hours 12/19/15 1751 12/23/15 1205   12/18/15 1000  Ampicillin-Sulbactam (UNASYN) 3 g in sodium chloride 0.9 % 100 mL IVPB  Status:  Discontinued     3 g 100 mL/hr over 60 Minutes Intravenous Every 8 hours 12/18/15 0952 12/19/15 1742        Intake/Output Summary (Last 24 hours) at 12/23/15 1749 Last data filed at 12/23/15 1354  Gross per 24 hour  Intake  260.5 ml  Output      0 ml  Net  260.5 ml   Filed Weights   12/18/15 1100  Weight: 66.1 kg (145 lb 11.6 oz)    Objective: Physical Exam: Filed Vitals:   12/23/15 0900 12/23/15 0904 12/23/15 0906 12/23/15 1125  BP:    107/56  Pulse: 114 113 109 105  Temp:    98.1 F (36.7 C)  TempSrc:    Axillary  Resp: 28 28 29  34  Height:      Weight:      SpO2: 88% 89% 90% 96%   General: Drowsy and lethargic, not following command Appear in marked distress Neck: difficult to assess JVD, no Abnormal Mass Or lumps Cardiovascular: S1 and S2 Present, aortic systolic Murmur, Respiratory: Bilateral Air entry equal and Decreased, bilateral Crackles, bilateral rhonchi and  wheezes Abdomen: Bowel Sound present, Soft and no tenderness  Extremities: no Pedal edema, no calf tenderness Neurologic: Left-sided weakness, not following command . Complete examination is difficult due to confusion  Data Reviewed: CBC:  Recent Labs Lab 12/17/15 1811  12/19/15 0539  12/19/15 1830 12/20/15 0130 12/21/15 0448 12/22/15 0718 12/23/15 0535  WBC 9.1  < > 8.3  --   --  10.4 10.6* 7.9 11.6*  NEUTROABS 7.3  --   --   --   --  8.1* 8.0*  --   --   HGB 6.5*  < > 9.3*  < > 9.1* 9.6*  9.6* 11.6* 12.1 12.4  HCT 23.1*  < > 30.8*  < > 29.8* 32.3*  32.0* 37.9 40.3 42.8  MCV 73.3*  < > 76.4*  --   --  76.5* 76.9* 79.6 81.2  PLT 452*  < > 365  --   --  450* 541* 438* 544*  < > = values in this interval not displayed. Basic Metabolic Panel:  Recent Labs Lab 12/17/15 2220  12/19/15 0539 12/20/15 0130 12/21/15 0448 12/22/15 0718 12/23/15 0535  NA  --   < > 136 136 139 143 145  K  --   < > 3.0* 3.0* 2.9* 4.7 4.0  CL  --   < > 95* 100* 100* 107 111  CO2  --   < > 26 26 26 25 24   GLUCOSE  --   < > 138* 254* 196* 180* 256*  BUN  --   < > 7 7 9 17 17   CREATININE  --   < > 0.71 0.60 0.61 0.79 0.76  CALCIUM  --   < > 8.9 9.3 9.8 10.2 10.4*  MG 1.3*  --   --  1.5* 1.8 1.9 1.8  PHOS 3.0  --   --   --   --   --   --   < > = values in this interval not displayed.  Liver Function Tests:  Recent Labs Lab 12/19/15 0539 12/20/15 0130 12/21/15 0448 12/22/15 0718 12/23/15 0535  AST 21 18 16 18 15   ALT 15 13* 12* 13* 12*  ALKPHOS 55 61 72 62 60  BILITOT 1.4* 0.7 1.0 0.5 0.5  PROT 6.7 6.6 7.4 7.3 7.5  ALBUMIN 3.3* 3.2* 3.3* 3.3* 3.1*    Recent Labs Lab 12/18/15 0917  LIPASE 18   No results for input(s): AMMONIA in the last 168 hours. Coagulation Profile:  Recent Labs Lab 12/17/15 1811 12/19/15 0539 12/20/15 0130 12/21/15 0448  INR 2.70* 1.33 1.32 1.20   Cardiac Enzymes:  Recent Labs Lab 12/17/15 2332 12/18/15 0501 12/18/15 0917 12/18/15 2228   TROPONINI 0.08* 0.06* 0.06* 0.04*   BNP (last 3 results) No results for input(s): PROBNP in the last 8760 hours.  CBG:  Recent Labs Lab 12/22/15 0818 12/22/15 1156 12/22/15 1645 12/22/15 2208 12/23/15 0710  GLUCAP 177* 312* 86 335* 265*    Studies: Dg Chest Port 1 View  12/23/2015  CLINICAL DATA:  Aspiration EXAM: PORTABLE CHEST 1 VIEW COMPARISON:  12/19/2015 FINDINGS: Normal cardiac silhouette. Lungs are hyperinflated. There is improvement in bilateral pleural effusions and atelectasis seen on comparison exam. Chronic bronchitic markings remain IMPRESSION: Improvement in bilateral pleural effusions and basilar atelectasis Electronically Signed   By: Suzy Bouchard M.D.   On: 12/23/2015 09:49     Scheduled Meds: . antiseptic oral rinse  7 mL Mouth Rinse BID  . ipratropium  0.5 mg Nebulization BID  . levalbuterol  1.25 mg Nebulization BID  . phenytoin (DILANTIN) IV  150 mg Intravenous Q8H  . valproate sodium  750 mg Intravenous Q8H   Continuous Infusions:  PRN Meds: acetaminophen **OR** acetaminophen, atropine, bisacodyl, glycopyrrolate **OR** glycopyrrolate **OR** glycopyrrolate, haloperidol **OR** haloperidol **OR** haloperidol lactate, LORazepam, morphine injection  Time spent: 30 minutes  Author: Berle Mull, MD [facility] Pager: [phone] 12/23/2015 5:49 PM  If 7PM-7AM, please contact night-coverage at [email or web address], password [facility]

## 2015-12-23 NOTE — Consult Note (Signed)
Consultation Note Date: 12/23/2015   Patient Name: Jocelyn Burgess  DOB: 07-07-1931  MRN: [id number]  Age / Sex: 82 y.o., female  PCP: Bobetta Lime, MD Referring Physician: Lavina Hamman, MD  Reason for Consultation: Establishing goals of care  HPI/Patient Profile: 82 y.o. female  with past medical history of PE, dementia, COPD, hypertension, diabetes mellitus, breast cancer admitted on 12/17/2015 with seizure followed by Todd's paralysis. She was found to have seizure, CVA and possible pneumonia.  Her condition has declined with continued encephalopathy as well as dysphagia and questionable aspiration requiring increasing levels of oxygen. Palliative consulted for goals of care moving forward.  Clinical Assessment and Goals of Care: I met today with patient's husband. He was meeting with Dr. Erlinda Hong just prior to my arrival.  He reports that is important thing to him is making sure that his wife is comfortable. He reports that they have a small family consisting of himself and they long-term friend who they have "adopted." He reports important thing to his wife has been her family.  [place] originally [place] and maintaining ties back to her roots has always been something that is been important to her.  He reports that her doctors have been doing a good job explaining things to him. He understands that she has continued to progress with decline in her nutrition, functional status, and cognition. He states that he feels that she is approaching the end of her life and sees distinct changes in her over the past couple of days. He states that if she were to understand her condition, her wishes would be to focus on comfort as she approaches the end of her life.  Discussed pathways moving forward including continuing with current therapy versus transitioning to comfort based  approach. He states that he would like to transition to comfort care only as this would be her desire. He also states that he is concerned about what happens whenever she dies as he does not have money at this time to have her cremated.    SUMMARY OF RECOMMENDATIONS   - With husband regarding continued progression and the fact that she is approaching end-of-life. He reports that she would want to focus on being comfortable as she approaches end-of-life. - Plan to transition to full comfort care. I placed orders using end-of-life order set. - He is concerned about having resources to cover funeral expenses after her death. I called and spoke briefly with social work. I will follow-up again with social work tomorrow in order to assist him in any way we can.  Code Status/Advance Care Planning:  DNR   Symptom Management:   Pain/shortness of breath: Plan for morphine as needed  Anxiety: Ativan as needed  Agitation: Haldol as needed  Excess secretions: Robinul as needed  Palliative Prophylaxis:   Frequent Pain Assessment  Additional Recommendations (Limitations, Scope, Preferences):  Full Comfort Care  Psycho-social/Spiritual:   Desire for further Chaplaincy support:no  Additional Recommendations: Grief/Bereavement Support  Prognosis:   Hours - Days  Discharge Planning: Anticipated Hospital Death     Primary Diagnoses: Present on Admission:  . 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 COPD, severe ([place]) . Dementia . Diabetes mellitus type 2, controlled, with complications ([place]) . Hypokalemia . Symptomatic anemia . Occult blood positive stool . Pulmonary nodule . Acute and chronic respiratory failure with hypoxia ([place]) . HCAP (healthcare-associated pneumonia) . CVA (cerebral infarction)  I have reviewed the medical record, interviewed the patient and family, and examined the patient. The following aspects are pertinent.  Past Medical History  Diagnosis Date  .  Allergy   . Cancer ([place])   . Diabetes mellitus without complication ([place])   . Arthritis   . Asthma   . Cataract   . COPD (chronic obstructive pulmonary disease) ([place])   . Depression     sometimes  . Hyperlipidemia   . Hypertension   . Oxygen deficiency   . Major depressive disorder, recurrent, in full remission with anxious distress ([place]) 11/23/2014  . Vertebral fracture, osteoporotic ([place]) 08/07/2015  . Anxiety   . Pulmonary embolism ([place])     Diagnosis 07/2015 in [place]. On Xarelto  . Seizure ([place]) 12/17/2015  . Symptomatic anemia 12/17/2015   Social History   Social History  . Marital Status: Married    Spouse Name: N/A  . Number of Children: N/A  . Years of Education: N/A   Social History Main Topics  . Smoking status: Former Smoker -- 1.00 packs/day for 45 years    Types: Cigarettes  . Smokeless tobacco: Current User  . Alcohol Use: No  . Drug Use: No  . Sexual Activity: No   Other Topics Concern  . None   Social History Narrative   Family History  Problem Relation Age of Onset  . Arthritis Mother   . Arthritis Father   . Cancer Sister     brain  . Heart disease Sister   . Stroke Sister   . Diabetes Brother    Scheduled Meds: . antiseptic oral rinse  7 mL Mouth Rinse BID  . ipratropium  0.5 mg Nebulization BID  . levalbuterol  1.25 mg Nebulization BID  . phenytoin (DILANTIN) IV  150 mg Intravenous Q8H  . valproate sodium  750 mg Intravenous Q8H   Continuous Infusions:  PRN Meds:.acetaminophen **OR** acetaminophen, bisacodyl, glycopyrrolate **OR** glycopyrrolate **OR** glycopyrrolate, haloperidol **OR** haloperidol **OR** haloperidol lactate, LORazepam, morphine injection Medications Prior to Admission:  Prior to Admission medications   Medication Sig Start Date End Date Taking? Authorizing Provider  acetaminophen (TYLENOL) 500 MG tablet Take 500 mg by mouth 3 (three) times daily.   Yes Historical Provider, MD  albuterol (PROVENTIL HFA;VENTOLIN HFA)  108 (90 Base) MCG/ACT inhaler Inhale 1-2 puffs into the lungs every 4 (four) hours as needed for wheezing or shortness of breath. 08/15/15  Yes Bobetta Lime, MD  cloNIDine (CATAPRES) 0.1 MG tablet Take 0.1 mg by mouth 2 (two) times daily.   Yes Historical Provider, MD  docusate sodium (COLACE) 100 MG capsule Take 1 capsule (100 mg total) by mouth 2 (two) times daily. 07/26/15  Yes Demetrios Loll, MD  donepezil (ARICEPT) 5 MG tablet Take 1 tablet (5 mg total) by mouth at bedtime. 08/08/15  Yes Bobetta Lime, MD  metFORMIN (GLUCOPHAGE) 1000 MG tablet Take 1 tablet (1,000 mg total) by mouth 2 (two) times daily with a meal. 07/03/15  Yes Bobetta Lime, MD  mometasone (ELOCON) 0.1 % cream Apply 1 application topically as needed (dry  itching skin).    Yes Historical Provider, MD  montelukast (SINGULAIR) 10 MG tablet Take 10 mg by mouth at bedtime.   Yes Historical Provider, MD  nystatin (MYCOSTATIN) 100000 UNIT/ML suspension Take 5 mLs by mouth 4 (four) times daily. Retain in mouth as long as possible then swallow.   Yes Historical Provider, MD  pravastatin (PRAVACHOL) 20 MG tablet Take 20 mg by mouth daily.    Yes Historical Provider, MD  QUEtiapine (SEROQUEL) 25 MG tablet Take 25 mg by mouth every 8 (eight) hours as needed (agitation). In addition to 50 mg twice daily   Yes Historical Provider, MD  QUEtiapine (SEROQUEL) 50 MG tablet Take 50 mg by mouth 2 (two) times daily. In addition to 25 mg every 8 hours as needed for agitation   Yes Historical Provider, MD  rivaroxaban (XARELTO) 20 MG TABS tablet Take 20 mg by mouth every evening. 5pm   Yes Historical Provider, MD  sertraline (ZOLOFT) 100 MG tablet Take 100 mg by mouth daily.   Yes Historical Provider, MD  tiotropium (SPIRIVA) 18 MCG inhalation capsule Place 18 mcg into inhaler and inhale at bedtime.    Yes Historical Provider, MD  furosemide (LASIX) 20 MG tablet Take 1 tablet (20 mg total) by mouth every morning. Patient not taking: Reported on 12/17/2015  07/30/15   Bobetta Lime, MD  magnesium oxide (MAG-OX) 400 (241.3 Mg) MG tablet Take 1 tablet (400 mg total) by mouth 2 (two) times daily. Patient not taking: Reported on 12/17/2015 08/23/15   Fritzi Mandes, MD  meloxicam (MOBIC) 15 MG tablet Take 1 tablet (15 mg total) by mouth daily. Patient not taking: Reported on 12/17/2015 05/01/15   Bobetta Lime, MD   Allergies  Allergen Reactions  . Ciprofloxacin Other (See Comments)    Reaction:  Unknown   . Codeine Other (See Comments)    Reaction:  Unknown   . Inhaler Decongestant [Methamphetamine] Other (See Comments)    Reaction:  Unknown   . Molds & Smuts Other (See Comments)    Reaction:  Wheezing/itchy,watery eyes   . Other Other (See Comments)    Pt is allergic to strawberries per MAR   Reaction:  Unknown   . Albuterol Sulfate Palpitations and Other (See Comments)    Pt states that she is allergic to the [facility] brand only.     Review of Systems  Unable to obtain secondary to mental status  Physical Exam  General: Lying in bed. Does not arouse to verbal or tactile stimulation, increased respiratory rate but otherwise no acute distress.  HEENT: No bruits, no goiter. Venturi mask in place Heart: Regular. Systolic murmur appreciated. Lungs: Decreased air entry. Scattered rhonchi Abdomen: Soft, nontender, nondistended, positive bowel sounds.  Ext: No significant edema Skin: Warm and dry Neuro: Does not respond to verbal or tactile stimulation   Vital Signs: BP 107/56 mmHg  Pulse 105  Temp(Src) 98.1 F (36.7 C) (Axillary)  Resp 34  Ht _0  (1.702 m)  Wt 66.1 kg (145 lb 11.6 oz)  BMI 22.82 kg/m2  SpO2 96% Pain Assessment: PAINAD POSS *See Group Information*: 1-Acceptable,Awake and alert Pain Score: 0-No pain   SpO2: SpO2: 96 % O2 Device:SpO2: 96 % O2 Flow Rate: .O2 Flow Rate (L/min): 4 L/min  IO: Intake/output summary:  Intake/Output Summary (Last 24 hours) at 12/23/15 1511 Last data filed at 12/23/15 1354  Gross per 24  hour  Intake  310.5 ml  Output      0 ml  Net  310.5 ml    LBM: Last BM Date: 12/18/15 Baseline Weight: Weight: 66.1 kg (145 lb 11.6 oz) Most recent weight: Weight: 66.1 kg (145 lb 11.6 oz)     Palliative Assessment/Data: 10%   Flowsheet Rows        Most Recent Value   Intake Tab    Referral Department  Hospitalist   Unit at Time of Referral  Intermediate Care Unit   Palliative Care Primary Diagnosis  Neurology   Date Notified  12/23/15   Palliative Care Type  New Palliative care   Date of Admission  12/17/15   Date first seen by Palliative Care  12/23/15   # of days Palliative referral response time  0 Day(s)   # of days IP prior to Palliative referral  6   Clinical Assessment    Palliative Performance Scale Score  10%   Pain Max last 24 hours  Not able to report   Pain Min Last 24 hours  Not able to report   Psychosocial & Spiritual Assessment    Palliative Care Outcomes    Patient/Family meeting held?  Yes   Who was at the meeting?  Husband   Palliative Care Outcomes  Improved pain interventions, Improved non-pain symptom therapy, Clarified goals of care, Provided psychosocial or spiritual support, Changed to focus on comfort      Time In: 1105 Time Out: 1220 Time Total: 75 Greater than 50%  of this time was spent counseling and coordinating care related to the above assessment and plan.  Signed by: Micheline Rough, MD   Please contact Palliative Medicine Team phone at [phone] for questions and concerns.  For individual provider: See Shea Evans

## 2015-12-23 NOTE — Progress Notes (Signed)
STROKE TEAM PROGRESS NOTE   SUBJECTIVE (INTERVAL HISTORY) Patient deteriorated over night with respiratory distress, currently on face mask but still tachypnea at 33 and tachycardia at 105. Worsening mental status and currently not responding to voice. Pt condition seems declined. Discussed with husband at bedside and he agrees with palliative care consult. Dr. Domingo Cocking has arrived and started talking with husband.     OBJECTIVE Temp:  [97.6 F (36.4 C)-98.4 F (36.9 C)] 98.4 F (36.9 C) (07/09 0708) Pulse Rate:  [90-114] 109 (07/09 0906) Cardiac Rhythm:  [-] Normal sinus rhythm;Sinus tachycardia (07/09 0713) Resp:  [18-29] 29 (07/09 0906) BP: (145-170)/(68-96) 159/68 mmHg (07/09 0708) SpO2:  [88 %-98 %] 90 % (07/09 0906)   Recent Labs Lab 12/22/15 0818 12/22/15 1156 12/22/15 1645 12/22/15 2208 12/23/15 0710  GLUCAP 177* 312* 86 335* 265*    Recent Labs Lab 12/17/15 2220  12/19/15 0539 12/20/15 0130 12/21/15 0448 12/22/15 0718 12/23/15 0535  NA  --   < > 136 136 139 143 145  K  --   < > 3.0* 3.0* 2.9* 4.7 4.0  CL  --   < > 95* 100* 100* 107 111  CO2  --   < > 26 26 26 25 24   GLUCOSE  --   < > 138* 254* 196* 180* 256*  BUN  --   < > 7 7 9 17 17   CREATININE  --   < > 0.71 0.60 0.61 0.79 0.76  CALCIUM  --   < > 8.9 9.3 9.8 10.2 10.4*  MG 1.3*  --   --  1.5* 1.8 1.9 1.8  PHOS 3.0  --   --   --   --   --   --   < > = values in this interval not displayed.  Recent Labs Lab 12/19/15 0539 12/20/15 0130 12/21/15 0448 12/22/15 0718 12/23/15 0535  AST 21 18 16 18 15   ALT 15 13* 12* 13* 12*  ALKPHOS 55 61 72 62 60  BILITOT 1.4* 0.7 1.0 0.5 0.5  PROT 6.7 6.6 7.4 7.3 7.5  ALBUMIN 3.3* 3.2* 3.3* 3.3* 3.1*    Recent Labs Lab 12/17/15 1811  12/19/15 0539  12/19/15 1830 12/20/15 0130 12/21/15 0448 12/22/15 0718 12/23/15 0535  WBC 9.1  < > 8.3  --   --  10.4 10.6* 7.9 11.6*  NEUTROABS 7.3  --   --   --   --  8.1* 8.0*  --   --   HGB 6.5*  < > 9.3*  < > 9.1* 9.6*   9.6* 11.6* 12.1 12.4  HCT 23.1*  < > 30.8*  < > 29.8* 32.3*  32.0* 37.9 40.3 42.8  MCV 73.3*  < > 76.4*  --   --  76.5* 76.9* 79.6 81.2  PLT 452*  < > 365  --   --  450* 541* 438* 544*  < > = values in this interval not displayed.  Recent Labs Lab 12/17/15 2332 12/18/15 0501 12/18/15 0917 12/18/15 2228  TROPONINI 0.08* 0.06* 0.06* 0.04*    Recent Labs  12/21/15 0448  LABPROT 15.4*  INR 1.20   No results for input(s): COLORURINE, LABSPEC, PHURINE, GLUCOSEU, HGBUR, BILIRUBINUR, KETONESUR, PROTEINUR, UROBILINOGEN, NITRITE, LEUKOCYTESUR in the last 72 hours.  Invalid input(s): APPERANCEUR     Component Value Date/Time   CHOL 103 12/18/2015 0501   TRIG 111 12/18/2015 0501   HDL 44 12/18/2015 0501   CHOLHDL 2.3 12/18/2015 0501   VLDL 22  12/18/2015 0501   LDLCALC 37 12/18/2015 0501   Lab Results  Component Value Date   HGBA1C 6.3* 12/18/2015      Component Value Date/Time   LABOPIA NONE DETECTED 12/17/2015 2028   LABOPIA POSITIVE* 08/26/2015 0514   COCAINSCRNUR NONE DETECTED 12/17/2015 2028   COCAINSCRNUR NONE DETECTED 08/26/2015 0514   LABBENZ NONE DETECTED 12/17/2015 2028   LABBENZ POSITIVE* 08/26/2015 0514   AMPHETMU NONE DETECTED 12/17/2015 2028   AMPHETMU NONE DETECTED 08/26/2015 0514   THCU NONE DETECTED 12/17/2015 2028   THCU NONE DETECTED 08/26/2015 0514   LABBARB NONE DETECTED 12/17/2015 2028   LABBARB NONE DETECTED 08/26/2015 0514     Recent Labs Lab 12/17/15 1811  ETH <5   IMAGING I have personally reviewed the radiological images below and agree with the radiology interpretations.  CXR 12/19/2015 Enlargement of cardiac silhouette with pulmonary vascular Congestion. Persistent pulmonary edema with bibasilar atelectasis and small RIGHT pleural effusion. Aortic atherosclerosis.  Ct Chest W Contrast 12/18/2015  IMPRESSION: Development of bilateral pleural effusions right larger than left with dependent atelectasis bilaterally and near complete collapse  of the right lower lobe. Interstitial and alveolar pattern suggesting fluid overload/ pulmonary edema diffusely. Multiple spiculated densities in the lower lobes have done smaller/retracted somewhat since the study of May. This favors inflammatory disease. Tumor hidden amongst this is not excluded. Right hilar lymphadenopathy persists, difficult to measure accurately because of the right lower lobe collapse. Note that the right lower lobe bronchus is now full of either mucous, pus or tumor. Enlargement of a pretracheal lymph node image 21. Previously this measured 12 mm. Today it measured 19 mm. 8   Mr Brain Wo Contrast 12/18/2015 IMPRESSION: Prematurely truncated scan due to lack of patient cooperation. Acute LEFT posterior frontal cortical infarct, 5 mm in size. This appears nonhemorrhagic. Atrophy and small vessel disease.   Ct Abdomen Pelvis W Contrast 12/18/2015  IMPRESSION: No significant finding in the abdomen or pelvis of an acute nature. Aortic atherosclerosis. Previously seen T12 fracture which has not progressed.   Ct Head Code Stroke W/o Cm 12/17/2015 IMPRESSION: No acute intracranial abnormality. Atrophy, chronic microvascular disease.   EEG This is an abnormal EEG due to mild diffuse generalized slowing of the background. This is consistent with the patient's known history of dementia.  She had a single electrographic seizure that appears to start in the right temporal region with rapid generalization. This lasted about 50 seconds in total and was associated clinically with versive left head turn and unresponsiveness.   CTA head and neck 12/19/2015  1. Negative for emergent large vessel occlusion or hemodynamically significant carotid or anterior circulation stenosis.  2. Positive for severe atherosclerosis of the aortic arch, including several areas of ulcerated plaque. 3. Positive for posterior circulation stenoses:  - moderate to severe Left vertebral artery origin stenosis, - mild to  moderate distal Right vertebral artery and proximal Left PCA stenoses. 4. Stable CT appearance of the brain. 5. Persistent small pleural effusions with suspected pulmonary edema superimposed on emphysema.  2D Echocardiogram  - Left ventricle: The cavity size was normal. Systolic function was normal. Wall motion was normal; there were no regional wall motion abnormalities. Features are consistent with a pseudonormal left ventricular filling pattern, with concomitant abnormal relaxation and increased filling pressure (grade 2 diastolic dysfunction). - Aortic valve: Trileaflet; mildly thickened, mildly calcified leaflets. - Mitral valve: Calcified annulus. There was mild regurgitation. - Pulmonary arteries: PA peak pressure: 75 mm Hg (S). Impressions:   The  right ventricular systolic pressure was increased consistent with severe pulmonary hypertension.  LE venous doppler - There is no acute DVT or SVT noted in the bilateral lower extremities. There appears to be chronic DVT noted in the left femoral vein.  LTM EEG  12/22/15 This is an abnormal EEG due to the presence of the following: 1) Generalized polymorphic delta and theta slowing, suggesting moderate to severe encephalopathy; 2) Frequent focal epileptiform discharges in the right posterior region, suggesting risk of focal epileptogenicity. Compared with the last study, current study showed no significant changes.  12/21/15 This is an abnormal EEG due to the presence of the following: 1) Generalized polymorphic delta and theta slowing, suggesting moderate to severe encephalopathy; 2) Frequent focal epileptiform discharges in the right posterior region, suggesting risk of focal epileptogenicity. Compared with the last study, current study showed slightly improved background activity but the right-sided epileptiform discharges were essentially unchanged.  12/20/15 - This is an abnormal EEG due to the presence of the following: 1) Generalized  polymorphic delta slowing, suggesting severe encephalopathy; 2) Frequent focal epileptiform discharges in the right posterior region, suggesting risk of focal epileptogenicity.   PHYSICAL EXAM is limited due to comfort request from husband General - Well nourished, well developed, in respiratory distress, on face mask with tachypnea and tachycardia.  Ophthalmologic - fundi not visualized due to distress.  Cardiovascular - Regular rhythm but tachycardia.  Neuro - Obtunded and not responsive to voice. On face mask, not open eyes, PERRL, facial symmetrical. No spontaneous movement at all 4 extremities. Babinski negative bilaterally.  ASSESSMENT/PLAN Jocelyn Burgess is a 82 y.o. female with history of DM, COPD, HTN, HLD, depression, dementia, PE in 07/2015 on Xarelto but currently on hold due to anemia admitted for GTC seizure. she has continued left UE and LE weakness, and EEG showed seizure coming out from right temporal region. MRI showed left CR/SO small infarct which is incidental finding and not correlating to her symptoms.    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  Stroke: incidental left frontal subcortical (CR/SO) infarct - likely due to small vessel disease, not correlating to current symptoms.   MRI left small CR/SO infarct  CTA head and neck - no large vessel occlusion  2D Echo  Severe pulmonary hypertension  LE venous doppler no acute  DVT but left old DVT  LDL 37  HgbA1c 6.3  SCDs for VTE prophylaxis  Diet NPO time specified   No antithrombotic recommended due to plan for palliative care  Anemia  Hb 6.5 -> 9.3-> 9.6->12.1  S/p blood transfusion  FOB positive  Xarelto discontinued due to anemia needing blood transfusion and FOB positive  B/l pleural effusion and RLL atelectasis   Underlying tumor not excluded  Management as per primary team  On unasyn  Respiratory status deteroriated  Plan for palliative care  History of PE on Xarelto  PE in 07/2015 on Xarelto since  LE venous doppler no acute DVT but old left DVT  Xarelto discontinued due to anemia requiring transfusion. No recommendation for anticoagulation due to plan for palliative care.  Diabetes  HgbA1c 6.3 goal < 7.0  Controlled  Hypertension  Home meds:   clonidine BP goal normotensive now Currently on coreg and lisinopril  Management per palliative care  Hyperlipidemia  Home meds:  lipitor    Currently on lipitor  LDL 37, goal < 70  Other Stroke Risk Factors  Advanced age  Other Active Problems  Dementia on aricept and seroquel. seroquel decreased to q hs yesterdya.  Depression on zoloft  Other Pertinent History  COPD  ? Cancer history  Hospital day # 6  Had long discussion with husband at bedside, due to advance baseline dementia, clinic seizure, worsening mental status and respiratory status, husband requested palliative care. Dr. Domingo Cocking is talking with husband. We agree with comfort care. Neurology will sign off at this time. Please call with questions. Case also discussed with Dr. Posey Pronto. Thanks for the consult.  Jocelyn Hawking, MD PhD Stroke Neurology 12/23/2015 10:53 AM   To contact Stroke Continuity provider, please refer to [email or web address] After hours, contact General Neurology

## 2015-12-24 MED ORDER — LORAZEPAM 2 MG/ML IJ SOLN: 1.0000 mg | INTRAMUSCULAR | Status: AC | PRN

## 2015-12-24 MED ORDER — VALPROATE SODIUM 500 MG/5ML IV SOLN: 750.0000 mg | Freq: Three times a day (TID) | INTRAVENOUS | Status: AC

## 2015-12-24 MED ORDER — MORPHINE SULFATE (PF) 2 MG/ML IV SOLN: 2.0000 mg | INTRAVENOUS | Status: AC | PRN

## 2015-12-24 MED ORDER — IPRATROPIUM BROMIDE 0.02 % IN SOLN: 0.5000 mg | Freq: Two times a day (BID) | RESPIRATORY_TRACT | Status: AC

## 2015-12-24 MED ORDER — SODIUM CHLORIDE 0.9 % IV SOLN: 150.0000 mg | Freq: Three times a day (TID) | INTRAVENOUS | Status: AC

## 2015-12-24 MED ORDER — GLYCOPYRROLATE 0.2 MG/ML IJ SOLN: 0.2000 mg | INTRAMUSCULAR | Status: AC | PRN

## 2015-12-24 NOTE — Progress Notes (Signed)
[id number]  [facility] RN Liaison Note for [employer]   Received request from [place] for family interest in [place] with request to transfer today . Chart reviewed .  There is a bed available at [facility] for patient to transfer today.   Met with the patient's husband Legrand Como to confirm interest and explain services.  He is agreeable for patient to transfer today.  CSW is aware.  Registration paperwork has been completed.  Dr. Orpah Melter to assume care per family request.   Please fax discharge summary to [phone].   RN please call report to [phone].    Please arrange for transport via [facility] as soon as possible.   Thank you for the referral!  Mickie Kay, [facility] Liaison  [phone]

## 2015-12-24 NOTE — Discharge Summary (Signed)
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 Please adjust medication for comfort  Diet recommendation: comfort feeds  Activity: The patient is advised to rest.  Discharge Condition: poor  Code Status: DNR DNI comfort care   History of present illness: As per the H and P dictated on admission, "Jocelyn Burgess is a 82 y.o. female with medical history significant of pulmonary embolism diagnosed in the family 2017 18 [place] currently on Xarelto, dementia COPD on chronic 2 - 3L of oxygen, HTN, DM2, remote history of breast cancer, tonic hyponatremia recurrent hypokalemia   Presented with seizure event witnessed by other caretakers She resides at the nursing home facility. Staff described generalized tonic-clonic seizure. That lasted for 10 minutes. After the seizure patient seemed to be more lethargic than usual and had left gaze deviation and seemed to be unable to move her left arm and leg. They will concern about stroke and call 911 code stroke was activated 1 state arrived to emerge department her weakness in gaze deviation resolved and she was able to verbalize. At her baseline patient is somewhat  confused. Patient does not endorse any fevers or chills no chest pain or shortness of breath no melena no bright red blood per rectum"  Hospital Course:  Pt. with PMH of PE, dementia, COPD, on 2 L at home, HTN, DM 2, breast cancer; admitted on 12/17/2015, with complaint of seizure followed by Todd's paralysis, was found to have seizure, CVA, pneumonia. Patient was started on continuous EEG monitoring and triple AED were initiated. The patient started becoming significantly lethargic progressively and despite that patient's active seizure spikes were not being controlled. Patient started having aspiration event with significant respiratory distress at which time it was discussed with patient's husband and decided to transition patient's care to complete comfort. Palliative care was consulted. Currently further plan is continue comfort care.  Summary of her active problems in the hospital is as following. 1. Seizure ([place]) EEG is positive for right temporal lobe seizure focus. CT head without any acute abnormality. started on Vimpat as well as Dilantin 100 mg 3 times a day. Depakote added on 12/21/2015.  2. Acute left posterior frontal infarct 5 mm size. Patient on Xarelto at home for DVT which was 3 months ago. Initially was on aspirin only due to concern of GI bleed. Neurology following. PTOT and speech consultation.  3. Aspiration pneumonia, healthcare associated. Acute on chronic hypoxic respiratory failure. COPD with chronic respiratory failure and 2 L of oxygen. Acute on chronic suspected diastolic dysfunction.  The patient is a resident of nursing home. Comes in with seizures as well as acute encephalopathy. Speech therapy recommends dysphagia type I diet. CT chest with contrast shows evidence  of possible pulmonary edema as well as right lower lobe consolidation and pleural effusion. Treated with duo nebs, added Mucinex,add chest PT Initially patient was on Unasyn antibiotic changed to  Zosyn. Patient received IV Lasix 2 with improvement in respiratory status. On 12/23/2015 the patient had another aspiration event requiring 6 L of oxygen and also in significant respiratory distress. At that point after discussion with patient's husband it was decided to transition patient's care to complete comfort.  4. Acute encephalopathy. Multifactorial in the setting of ongoing seizure activity as well as daily ADL with infection. discontinue Aricept, Seroquel, clonidine.  5. Symptomatic anemia. Status post 1 PRBC on 12/18/2015 as well as Feraheme 1. GI consulted who recommended no acute intervention at present. Xarelto is currently on hold. Recommend to discontinue it since no acute DVT identified. H&H remain stable.  6. History of pulmonary embolism, chronic DVT on vascular Doppler On anticoagulation at home which is currently on hold due to hemoptysis and GI bleed concern. Resumed DVT prophylaxis on 12/20/2015.  7. Essential hypertension. Blood pressure initially was elevated currently allowing permissive hypertension in the setting of CVA. Clonidine twice a day is discontinued due to low blood pressure as well as psychotropic effect.  8. Hypokalemia. Resolved  9. diabetes mellitus. A1c 6.3 on 12/18/2015.  10.goals of care discussion. Discussed with husband on 12/23/2015 after an aspiration event. The patient's significant respiratory distress and no meaningful improvement in patient's mentation despite ongoing antiepileptic medication husband decided to transition patient's care to complete comfort. Discussed with neurology and they were also in agreement. Discussed with palliative care and appreciate their assistance in patient's symptom management.  Pt was seen by palliative care, who recommended in-patient hospice, which was arranged by social worker and case Freight forwarder. On the day of the discharge the patient's symptoms were well controlled, and no other acute medical  condition were reported by patient. the patient was felt safe to be discharge at in patient hospice with hospice.  Procedures and Results:  EEG   Consultations:  Neurology  Palliative care  Gastroenterology   DISCHARGE MEDICATION: Current Discharge Medication List    START taking these medications   Details  glycopyrrolate (ROBINUL) 0.2 MG/ML injection Inject 1 mL (0.2 mg total) into the skin every 4 (four) hours as needed (excessive secretions). Qty: 1 mL    ipratropium (ATROVENT) 0.02 % nebulizer solution Take 2.5 mLs (0.5 mg total) by nebulization 2 (two) times daily. Qty: 75 mL, Refills: 12    LORazepam (ATIVAN) 2 MG/ML injection Inject 0.5 mLs (1 mg total) into the vein every 4 (four) hours as needed for seizure. Qty: 1 mL, Refills: 0    morphine 2 MG/ML injection Inject 1-2 mLs (2-4 mg total) into the vein every hour as needed (shortness of breath, or respiratory rate >25). Qty: 1 mL, Refills: 0    phenytoin 150 mg in sodium chloride 0.9 % 100 mL Inject 150 mg into the vein every 8 (eight) hours.    valproate 750 mg in dextrose 5 % 50 mL Inject 750 mg into the vein every 8 (eight) hours.      CONTINUE these medications which have NOT CHANGED   Details  acetaminophen (TYLENOL) 500 MG tablet Take 500 mg by mouth 3 (three) times daily.    albuterol (PROVENTIL HFA;VENTOLIN HFA) 108 (90 Base) MCG/ACT inhaler Inhale 1-2 puffs into the lungs every 4 (four) hours as needed for wheezing or shortness of breath. Qty: 1 Inhaler, Refills: 5   Associated Diagnoses:  COPD, severe ([place])      STOP taking these medications     cloNIDine (CATAPRES) 0.1 MG tablet      docusate sodium (COLACE) 100 MG capsule      donepezil (ARICEPT) 5 MG tablet      metFORMIN (GLUCOPHAGE) 1000 MG tablet      mometasone (ELOCON) 0.1 % cream      montelukast (SINGULAIR) 10 MG tablet      nystatin (MYCOSTATIN) 100000 UNIT/ML suspension      pravastatin (PRAVACHOL) 20 MG tablet       QUEtiapine (SEROQUEL) 25 MG tablet      QUEtiapine (SEROQUEL) 50 MG tablet      rivaroxaban (XARELTO) 20 MG TABS tablet      sertraline (ZOLOFT) 100 MG tablet      tiotropium (SPIRIVA) 18 MCG inhalation capsule      furosemide (LASIX) 20 MG tablet      magnesium oxide (MAG-OX) 400 (241.3 Mg) MG tablet      meloxicam (MOBIC) 15 MG tablet        Allergies  Allergen Reactions  . Ciprofloxacin Other (See Comments)    Reaction:  Unknown   . Codeine Other (See Comments)    Reaction:  Unknown   . Inhaler Decongestant [Methamphetamine] Other (See Comments)    Reaction:  Unknown   . Molds & Smuts Other (See Comments)    Reaction:  Wheezing/itchy,watery eyes   . Other Other (See Comments)    Pt is allergic to strawberries per MAR   Reaction:  Unknown   . Albuterol Sulfate Palpitations and Other (See Comments)    Pt states that she is allergic to the [facility] brand only.     Discharge Instructions    Comfort measures    Complete by:  As directed           Discharge Exam: Filed Weights   12/18/15 1100  Weight: 66.1 kg (145 lb 11.6 oz)   Filed Vitals:   12/23/15 2055 12/24/15 0907  BP:  138/64  Pulse: 107 108  Temp:  98.2 F (36.8 C)  Resp: 30 31   General: Appear in marked distress, no Rash; Oral Mucosa moist. Cardiovascular: S1 and S2 Present, aortic systolic Murmur, no JVD Respiratory: Bilateral Air entry present and bilateral Crackles, no wheezes Abdomen: Bowel Sound present, Soft and no tenderness Extremities: no Pedal edema, no calf tenderness Neurology: Grossly no focal neuro deficit.  The results of significant diagnostics from this hospitalization (including imaging, microbiology, ancillary and laboratory) are listed below for reference.    Significant Diagnostic Studies: Ct Angio Head W Or Wo Contrast  12/19/2015  CLINICAL DATA:  82 year old female with evidence of the left posterior frontal lacunar infarct on limited MRI yesterday performed for seizure.  Initial encounter. EXAM: CT ANGIOGRAPHY HEAD AND NECK TECHNIQUE: Multidetector CT imaging of the head and neck was performed using the standard protocol during bolus administration of intravenous contrast. Multiplanar CT image reconstructions and MIPs were obtained to evaluate the vascular anatomy. Carotid stenosis measurements (when applicable) are obtained utilizing NASCET criteria, using the distal internal carotid diameter as the denominator. CONTRAST:  50 mL Isovue 370 COMPARISON:  Brain MRI 12/18/2015. Head CT without contrast 12/17/2015. Chest abdomen and pelvis CT 12/18/2015 FINDINGS: CT HEAD Brain: Stable noncontrast CT appearance of the brain. Confluent bilateral cerebral white matter hypodensity. No acute or evolving cortically based infarct is evident by CT. No acute intracranial hemorrhage identified. No midline shift, mass effect, or evidence of  intracranial mass lesion. Calvarium and skull base:  No acute osseous abnormality identified. Paranasal sinuses: Stable, mild left sphenoid opacification. Orbits: Stable postoperative changes to the globes. Negative visualized scalp soft tissues. CTA NECK Skeleton: Osteopenia. Absent dentition. Moderate to severe T5 and T6 compression fractures (vertebra plana at T6) as seen on CT chest yesterday. Partially visible nonacute appearing posterior left seventh rib fracture. No new osseous abnormality identified. Other neck: Bilateral layering pleural effusions appear stable since yesterday. Pulmonary ground-glass opacity with septal thickening superimposed on evidence of centrilobular emphysema. Stable visualized mediastinal lymph nodes. Negative for age thyroid with left lobe nodules which do not meet consensus criteria for thyroid ultrasound follow-up. Larynx, pharynx, parapharyngeal spaces, retropharyngeal space, sublingual space, submandibular glands, parotid glands, and bilateral cervical lymph nodes are within normal limits. Aortic arch: Severe soft and  calcified arch atherosclerosis with several areas of ulcerated plaque (series 501, images 19 and 14 and coronal series 504, image 140). Bovine type arch configuration. Right carotid system: No brachiocephalic artery or right CCA origin stenosis despite atherosclerosis. Mildly tortuous right CCA. Negative for age right carotid bifurcation with mild atherosclerosis and no stenosis. Negative cervical right ICA. Left carotid system: Bovine type left CCA origin with no stenosis despite calcified plaque. Mildly tortuous proximal left CCA. Confluent calcified plaque at the left carotid bifurcation with less than 50 % stenosis with respect to the distal vessel. Left ICA bulb is widely patent. Negative cervical left ICA otherwise. Vertebral arteries: No proximal right subclavian artery stenosis despite atherosclerosis. Normal right vertebral artery origin. The right vertebral artery is mildly tortuous and mildly dominant, but otherwise normal to the skullbase. No proximal left subclavian artery stenosis despite atherosclerosis. However, there is moderate to severe atherosclerotic stenosis at the left vertebral artery origin as seen on series 504, image 166. Mildly non dominant left vertebral artery otherwise is normal to the skullbase. CTA HEAD Posterior circulation: Dominant distal right vertebral artery with moderate V4 segment calcified plaque but only mild to moderate stenosis. No distal left vertebral artery stenosis. Normal left PICA origin. Normal vertebrobasilar junction. No basilar stenosis. Normal SCA and PCA origins. Right PCA branches are within normal limits. There is moderate stenosis of the left PCA P1 segment as seen on series 503, image 241. The left PCA branches otherwise appear normal. Posterior communicating arteries are diminutive or absent. Anterior circulation: Both ICA siphons are patent. There is mild to moderate bilateral siphon calcified plaque which does not appear hemodynamically significant on  either side. Normal ophthalmic artery origins. Patent carotid termini. Dominant left ACA A1 segment, the right is diminutive. Anterior communicating artery and bilateral ACA branches are within normal limits. Left MCA M1 segment, bifurcation, and left MCA branches are normal. Right MCA M1 segment, trifurcation, and right MCA branches are normal. Venous sinuses: Patent. Anatomic variants: Dominant right vertebral artery and left ACA A1 segment. Delayed phase: No abnormal enhancement identified. IMPRESSION: 1. Negative for emergent large vessel occlusion or hemodynamically significant carotid or anterior circulation stenosis. 2. Positive for severe atherosclerosis of the aortic arch, including several areas of ulcerated plaque. 3. Positive for posterior circulation stenoses: - moderate to severe Left vertebral artery origin stenosis, - mild to moderate distal Right vertebral artery and proximal Left PCA stenoses. 4. Stable CT appearance of the brain. 5. Persistent small pleural effusions with suspected pulmonary edema superimposed on emphysema. Electronically Signed   By: Genevie Ann M.D.   On: 12/19/2015 13:51   Dg Chest 2 View  12/18/2015  CLINICAL DATA:  Patient presents with mid upper back pain and shortness of breath. Also history of seizure. EXAM: CHEST  2 VIEW COMPARISON:  12/17/2015. FINDINGS: Cardiomegaly. Severe COPD. Concerning again raised for RIGHT hilar mass. RIGHT pleural effusion is noted. Possible RIGHT basilar airspace disease. Diffuse pulmonary edema has developed since priors. Consider CT chest for further evaluation. Severe osteopenia. The thoracic vertebrae are insufficiently well-visualized to assess for interval stability of compression deformities. IMPRESSION: Persistent RIGHT hilar prominence. Adenopathy or mass is not excluded. Worsening aeration from priors, with regard to RIGHT basilar opacity, RIGHT effusion, and diffuse edema. Electronically Signed   By: Staci Righter M.D.   On: 12/18/2015  08:45   Ct Angio Neck W Or Wo Contrast  12/19/2015  CLINICAL DATA:  82 year old female with evidence of the left posterior frontal lacunar infarct on limited MRI yesterday performed for seizure. Initial encounter. EXAM: CT ANGIOGRAPHY HEAD AND NECK TECHNIQUE: Multidetector CT imaging of the head and neck was performed using the standard protocol during bolus administration of intravenous contrast. Multiplanar CT image reconstructions and MIPs were obtained to evaluate the vascular anatomy. Carotid stenosis measurements (when applicable) are obtained utilizing NASCET criteria, using the distal internal carotid diameter as the denominator. CONTRAST:  50 mL Isovue 370 COMPARISON:  Brain MRI 12/18/2015. Head CT without contrast 12/17/2015. Chest abdomen and pelvis CT 12/18/2015 FINDINGS: CT HEAD Brain: Stable noncontrast CT appearance of the brain. Confluent bilateral cerebral white matter hypodensity. No acute or evolving cortically based infarct is evident by CT. No acute intracranial hemorrhage identified. No midline shift, mass effect, or evidence of intracranial mass lesion. Calvarium and skull base:  No acute osseous abnormality identified. Paranasal sinuses: Stable, mild left sphenoid opacification. Orbits: Stable postoperative changes to the globes. Negative visualized scalp soft tissues. CTA NECK Skeleton: Osteopenia. Absent dentition. Moderate to severe T5 and T6 compression fractures (vertebra plana at T6) as seen on CT chest yesterday. Partially visible nonacute appearing posterior left seventh rib fracture. No new osseous abnormality identified. Other neck: Bilateral layering pleural effusions appear stable since yesterday. Pulmonary ground-glass opacity with septal thickening superimposed on evidence of centrilobular emphysema. Stable visualized mediastinal lymph nodes. Negative for age thyroid with left lobe nodules which do not meet consensus criteria for thyroid ultrasound follow-up. Larynx, pharynx,  parapharyngeal spaces, retropharyngeal space, sublingual space, submandibular glands, parotid glands, and bilateral cervical lymph nodes are within normal limits. Aortic arch: Severe soft and calcified arch atherosclerosis with several areas of ulcerated plaque (series 501, images 19 and 14 and coronal series 504, image 140). Bovine type arch configuration. Right carotid system: No brachiocephalic artery or right CCA origin stenosis despite atherosclerosis. Mildly tortuous right CCA. Negative for age right carotid bifurcation with mild atherosclerosis and no stenosis. Negative cervical right ICA. Left carotid system: Bovine type left CCA origin with no stenosis despite calcified plaque. Mildly tortuous proximal left CCA. Confluent calcified plaque at the left carotid bifurcation with less than 50 % stenosis with respect to the distal vessel. Left ICA bulb is widely patent. Negative cervical left ICA otherwise. Vertebral arteries: No proximal right subclavian artery stenosis despite atherosclerosis. Normal right vertebral artery origin. The right vertebral artery is mildly tortuous and mildly dominant, but otherwise normal to the skullbase. No proximal left subclavian artery stenosis despite atherosclerosis. However, there is moderate to severe atherosclerotic stenosis at the left vertebral artery origin as seen on series 504, image 166. Mildly non dominant left vertebral artery otherwise is normal to the skullbase. CTA HEAD Posterior circulation: Dominant distal right  vertebral artery with moderate V4 segment calcified plaque but only mild to moderate stenosis. No distal left vertebral artery stenosis. Normal left PICA origin. Normal vertebrobasilar junction. No basilar stenosis. Normal SCA and PCA origins. Right PCA branches are within normal limits. There is moderate stenosis of the left PCA P1 segment as seen on series 503, image 241. The left PCA branches otherwise appear normal. Posterior communicating arteries  are diminutive or absent. Anterior circulation: Both ICA siphons are patent. There is mild to moderate bilateral siphon calcified plaque which does not appear hemodynamically significant on either side. Normal ophthalmic artery origins. Patent carotid termini. Dominant left ACA A1 segment, the right is diminutive. Anterior communicating artery and bilateral ACA branches are within normal limits. Left MCA M1 segment, bifurcation, and left MCA branches are normal. Right MCA M1 segment, trifurcation, and right MCA branches are normal. Venous sinuses: Patent. Anatomic variants: Dominant right vertebral artery and left ACA A1 segment. Delayed phase: No abnormal enhancement identified. IMPRESSION: 1. Negative for emergent large vessel occlusion or hemodynamically significant carotid or anterior circulation stenosis. 2. Positive for severe atherosclerosis of the aortic arch, including several areas of ulcerated plaque. 3. Positive for posterior circulation stenoses: - moderate to severe Left vertebral artery origin stenosis, - mild to moderate distal Right vertebral artery and proximal Left PCA stenoses. 4. Stable CT appearance of the brain. 5. Persistent small pleural effusions with suspected pulmonary edema superimposed on emphysema. Electronically Signed   By: Genevie Ann M.D.   On: 12/19/2015 13:51   Ct Chest W Contrast  12/18/2015  CLINICAL DATA:  Abnormal chest radiograph with edema and effusions. Right hilar mass suggested. EXAM: CT CHEST WITH CONTRAST TECHNIQUE: Multidetector CT imaging of the chest was performed during intravenous contrast administration. CONTRAST:  172mL ISOVUE-300 IOPAMIDOL (ISOVUE-300) INJECTION 61% COMPARISON:  Chest radiography same day and multiple previous. Chest CT 11/04/2015. Chest CT 07/23/2015. FINDINGS: The patient has developed bilateral pleural effusions layering dependently, larger on the right than the left, with dependent pulmonary atelectasis. There is near complete collapse of the  right lower lobe. There is interstitial prominence diffusely is well some early alveolar density most consistent with interstitial and alveolar edema. Multiple spiculated densities in the right upper lobe as seen on the study of 11/04/2015 appear quite similar. These are not progressive. Some of them actually have retracted somewhat. Measured in the same location, the dominant focus which previously measured 24 x 26 mm now measures 23 x 20 mm. There continue to be enlarged right hilar and infrahilar nodes, more difficult to measure because of the right lower lobe collapse. These appear similar. The right lower lobe bronchus is filled with either mucous, pus or tumor. Abnormal density posteriorly in the left lower lobe has also gotten smaller since the previous study. Measurements today are approximately 2.9 x 3.2 cm as opposed to 6 x 4.6 cm previously. Certainly, the fact that these areas are shrinking suggests that they could be due to inflammatory disease. Pretracheal lymph node on image 21 is larger, measuring maximally 19 mm as opposed to 12 mm previously. Compression fractures at T12, T5 and T6 have not progressed. No new fractures are seen. IMPRESSION: Development of bilateral pleural effusions right larger than left with dependent atelectasis bilaterally and near complete collapse of the right lower lobe. Interstitial and alveolar pattern suggesting fluid overload/ pulmonary edema diffusely. Multiple spiculated densities in the lower lobes have done smaller/retracted somewhat since the study of May. This favors inflammatory disease. Tumor hidden amongst this  is not excluded. Right hilar lymphadenopathy persists, difficult to measure accurately because of the right lower lobe collapse. Note that the right lower lobe bronchus is now full of either mucous, pus or tumor. Enlargement of a pretracheal lymph node image 21. Previously this measured 12 mm. Today it measured 19 mm. Electronically Signed   By: Nelson Chimes M.D.   On: 12/18/2015 22:58   Mr Brain Wo Contrast  12/18/2015  CLINICAL DATA:  Patient with dementia, and history of seizures, presents with a seizure today. EXAM: MRI HEAD WITHOUT CONTRAST TECHNIQUE: Multiplanar, multiecho pulse sequences of the brain and surrounding structures were obtained without intravenous contrast. COMPARISON:  12/17/2015. FINDINGS: The patient was uncooperative, moving and yelling in the scanner. Only 4 sequences were obtained. Sagittal T1 weighted images are nondiagnostic. Axial T2 weighted images demonstrate atrophy with chronic microvascular ischemic change. Axial diffusion-weighted imaging demonstrates an acute infarct of the LEFT posterior frontal cortex, 5 mm in diameter. No definite hemorrhage. Compared with recent CT, the infarct is not visible. IMPRESSION: Prematurely truncated scan due to lack of patient cooperation. Acute LEFT posterior frontal cortical infarct, 5 mm in size. This appears nonhemorrhagic. Atrophy and small vessel disease. Electronically Signed   By: Staci Righter M.D.   On: 12/18/2015 07:46   Ct Abdomen Pelvis W Contrast  12/18/2015  CLINICAL DATA:  Generalized and upper abdominal pain. Blood in stool. Lung nodule. Abnormal chest radiograph. EXAM: CT ABDOMEN AND PELVIS WITH CONTRAST TECHNIQUE: Multidetector CT imaging of the abdomen and pelvis was performed using the standard protocol following bolus administration of intravenous contrast. CONTRAST:  164mL ISOVUE-300 IOPAMIDOL (ISOVUE-300) INJECTION 61% COMPARISON:  07/21/2015 FINDINGS: The liver has a normal appearance without focal lesions. No calcified gallstones. The spleen is normal. The pancreas is normal. The adrenal glands may show mild hyperplasia but there is no focal lesion. The kidneys are normal. There is aortic atherosclerosis but no aneurysm. The IVC is normal. No retroperitoneal mass or lymphadenopathy. No free intraperitoneal fluid or air. Bladder appears normal. Uterus and adnexal  regions are normal. No bowel pathology. Chronic degenerative changes affect the spine. There is compression fracture at T12 which is new since February but was present on a CT chest of 11/04/2015. IMPRESSION: No significant finding in the abdomen or pelvis of an acute nature. Aortic atherosclerosis. Previously seen T12 fracture which has not progressed. Electronically Signed   By: Nelson Chimes M.D.   On: 12/18/2015 22:38   Dg Chest Port 1 View  12/23/2015  CLINICAL DATA:  Aspiration EXAM: PORTABLE CHEST 1 VIEW COMPARISON:  12/19/2015 FINDINGS: Normal cardiac silhouette. Lungs are hyperinflated. There is improvement in bilateral pleural effusions and atelectasis seen on comparison exam. Chronic bronchitic markings remain IMPRESSION: Improvement in bilateral pleural effusions and basilar atelectasis Electronically Signed   By: Suzy Bouchard M.D.   On: 12/23/2015 09:49   Dg Chest Port 1 View  12/19/2015  CLINICAL DATA:  Acute respiratory failure with hypoxia EXAM: PORTABLE CHEST 1 VIEW COMPARISON:  Portable exam 1815 hours compared to 12/18/2015 FINDINGS: Enlargement of cardiac silhouette with pulmonary vascular congestion. BILATERAL infiltrates question pulmonary edema. Scattered subsegmental atelectasis in the inferior lungs bilaterally with persistent RIGHT pleural effusion. RIGHT hilar enlargement seen on the previous exam is less evident on current portable study. Atherosclerotic calcification aorta. No pneumothorax. IMPRESSION: Enlargement of cardiac silhouette with pulmonary vascular congestion. Persistent pulmonary edema with bibasilar atelectasis and small RIGHT pleural effusion. Aortic atherosclerosis. Electronically Signed   By: Crist Infante.D.  On: 12/19/2015 18:48   Dg Chest Portable 1 View  12/17/2015  CLINICAL DATA:  Seizures.  Altered mental status EXAM: PORTABLE CHEST 1 VIEW COMPARISON:  Nov 12, 2015 FINDINGS: There is interstitial edema. There is mild cardiomegaly. There is mild pulmonary  venous hypertension. There is extensive atherosclerotic calcification in the aorta. There is prominence in or overlying the right hilar region. Question pulmonary nodular lesion overlying the right hilum versus right hilar adenopathy. No bone lesions are evident. IMPRESSION: Prominence in the right perihilar region with concern for mass or adenopathy in this area. This opacity measures 2.9 x 2.9 cm. Advise noncontrast enhanced chest CT to assess for potential mass or adenopathy in the right perihilar region. Interstitial edema with mild cardiac prominence and pulmonary venous hypertension consistent with a degree of congestive heart failure. Aortic atherosclerosis noted. Electronically Signed   By: Lowella Grip III M.D.   On: 12/17/2015 19:15   Ct Head Code Stroke W/o Cm  12/17/2015  CLINICAL DATA:  Code stroke.  Left side gaze and weakness. EXAM: CT HEAD WITHOUT CONTRAST TECHNIQUE: Contiguous axial images were obtained from the base of the skull through the vertex without intravenous contrast. COMPARISON:  08/22/2015 FINDINGS: There is atrophy and chronic small vessel disease changes. No acute intracranial abnormality. Specifically, no hemorrhage, hydrocephalus, mass lesion, acute infarction, or significant intracranial injury. No acute calvarial abnormality. Mastoid air cells are clear. Minimal opacity in the posterior left sphenoid sinus. Remainder the paranasal sinuses are clear. IMPRESSION: No acute intracranial abnormality. Atrophy, chronic microvascular disease. Critical Value/emergent results were called by telephone at the time of interpretation on 12/17/2015 at 6:32 pm to Dr. Shon Hale , who verbally acknowledged these results. Electronically Signed   By: Rolm Baptise M.D.   On: 12/17/2015 18:35    Microbiology: Recent Results (from the past 240 hour(s))  Culture, blood (Routine X 2) w Reflex to ID Panel     Status: None   Collection Time: 12/18/15  9:20 AM  Result Value Ref Range Status    Specimen Description BLOOD RIGHT ARM  Final   Special Requests   Final    BOTTLES DRAWN AEROBIC AND ANAEROBIC 8CC AER 5CC ANA   Culture NO GROWTH 5 DAYS  Final   Report Status 12/23/2015 FINAL  Final  Culture, blood (Routine X 2) w Reflex to ID Panel     Status: None   Collection Time: 12/18/15  9:41 AM  Result Value Ref Range Status   Specimen Description BLOOD LEFT HAND  Final   Special Requests IN PEDIATRIC BOTTLE 3CC  Final   Culture NO GROWTH 5 DAYS  Final   Report Status 12/23/2015 FINAL  Final  MRSA PCR Screening     Status: None   Collection Time: 12/18/15 11:07 AM  Result Value Ref Range Status   MRSA by PCR NEGATIVE NEGATIVE Final    Comment:        The GeneXpert MRSA Assay (FDA approved for NASAL specimens only), is one component of a comprehensive MRSA colonization surveillance program. It is not intended to diagnose MRSA infection nor to guide or monitor treatment for MRSA infections.      Labs: CBC:  Recent Labs Lab 12/17/15 1811  12/19/15 0539  12/19/15 1830 12/20/15 0130 12/21/15 0448 12/22/15 0718 12/23/15 0535  WBC 9.1  < > 8.3  --   --  10.4 10.6* 7.9 11.6*  NEUTROABS 7.3  --   --   --   --  8.1* 8.0*  --   --  HGB 6.5*  < > 9.3*  < > 9.1* 9.6*  9.6* 11.6* 12.1 12.4  HCT 23.1*  < > 30.8*  < > 29.8* 32.3*  32.0* 37.9 40.3 42.8  MCV 73.3*  < > 76.4*  --   --  76.5* 76.9* 79.6 81.2  PLT 452*  < > 365  --   --  450* 541* 438* 544*  < > = values in this interval not displayed. Basic Metabolic Panel:  Recent Labs Lab 12/17/15 2220  12/19/15 0539 12/20/15 0130 12/21/15 0448 12/22/15 0718 12/23/15 0535  NA  --   < > 136 136 139 143 145  K  --   < > 3.0* 3.0* 2.9* 4.7 4.0  CL  --   < > 95* 100* 100* 107 111  CO2  --   < > 26 26 26 25 24   GLUCOSE  --   < > 138* 254* 196* 180* 256*  BUN  --   < > 7 7 9 17 17   CREATININE  --   < > 0.71 0.60 0.61 0.79 0.76  CALCIUM  --   < > 8.9 9.3 9.8 10.2 10.4*  MG 1.3*  --   --  1.5* 1.8 1.9 1.8  PHOS  3.0  --   --   --   --   --   --   < > = values in this interval not displayed. Liver Function Tests:  Recent Labs Lab 12/19/15 0539 12/20/15 0130 12/21/15 0448 12/22/15 0718 12/23/15 0535  AST 21 18 16 18 15   ALT 15 13* 12* 13* 12*  ALKPHOS 55 61 72 62 60  BILITOT 1.4* 0.7 1.0 0.5 0.5  PROT 6.7 6.6 7.4 7.3 7.5  ALBUMIN 3.3* 3.2* 3.3* 3.3* 3.1*    Recent Labs Lab 12/18/15 0917  LIPASE 18   No results for input(s): AMMONIA in the last 168 hours. Cardiac Enzymes:  Recent Labs Lab 12/17/15 2332 12/18/15 0501 12/18/15 0917 12/18/15 2228  TROPONINI 0.08* 0.06* 0.06* 0.04*   BNP (last 3 results)  Recent Labs  12/18/15 0501  BNP 614.4*   CBG:  Recent Labs Lab 12/22/15 0818 12/22/15 1156 12/22/15 1645 12/22/15 2208 12/23/15 0710  GLUCAP 177* 312* 86 335* 265*   Time spent: 30 minutes  Signed:  Aasia Peavler  [facility]  12/24/2015  , 12:46 PM

## 2015-12-24 NOTE — Care Management Important Message (Signed)
Important Message  Patient Details  Name: Jocelyn Burgess MRN: [id number] Date of Birth: November 29, 1931   Medicare Important Message Given:  Yes    Loann Quill 12/24/2015, 8:25 AM

## 2015-12-24 NOTE — Clinical Social Work Note (Addendum)
Patient's husband prefers Opt[facility] since family friend is driving up from [place] and getting a hotel in [place]. Referral for [facility] made to [employer].   Dayton Scrape, [place] [phone]  12:29 pm Patient has bed at [facility] today. Paperwork completed by patient's husband with Becky Augusta. MD paged.  Dayton Scrape, [place]

## 2015-12-24 NOTE — Clinical Social Work Note (Signed)
CSW facilitated patient discharge including contacting patient family and facility to confirm patient discharge plans. Clinical information faxed to facility and family agreeable with plan. CSW arranged ambulance transport via [facility] to [facility]. RN to call report prior to discharge [phone]).  MD paged to sign DNR.  CSW will sign off for now as social work intervention is no longer needed. Please consult [place] again if new needs arise.  Dayton Scrape, [place]

## 2015-12-24 NOTE — Progress Notes (Signed)
Patient to discharge to [facility] report given to receiving nurse Nira Conn, all questions answered at this time.  Pt. Appears comfortable at this time.

## 2016-01-02 DIAGNOSIS — J449 Chronic obstructive pulmonary disease, unspecified: Secondary | ICD-10-CM | POA: Diagnosis not present

## 2016-01-15 DEATH — deceased

## 2016-08-09 IMAGING — CT CT HEAD W/O CM
2 series · 15 of 30 positions shown, 19 images · non-contrast
Comparison: No priors.

CLINICAL DATA: 83-year-old female with confusion. Paranoia. Altered
mental status. Febrile and tachycardic.

EXAM:
CT HEAD WITHOUT CONTRAST
TECHNIQUE: Contiguous axial images were obtained from the base of the skull
through the vertex without intravenous contrast.

[Series 2: head wo · axial · 0.43mm/px · z∈[-186,-52]mm · 13 of 33 slices shown, 17 images]
[im 3/33  brain]
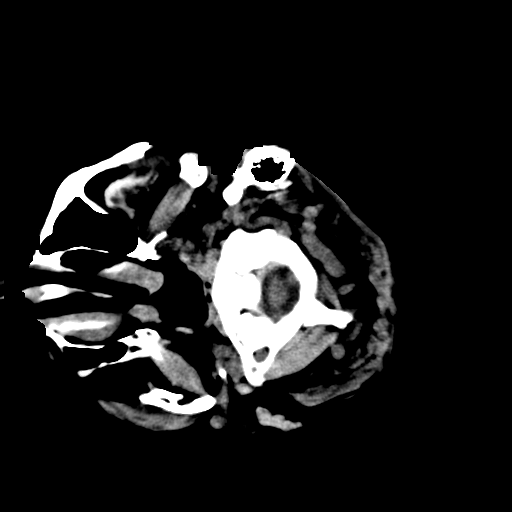
[im 3/33  bone]
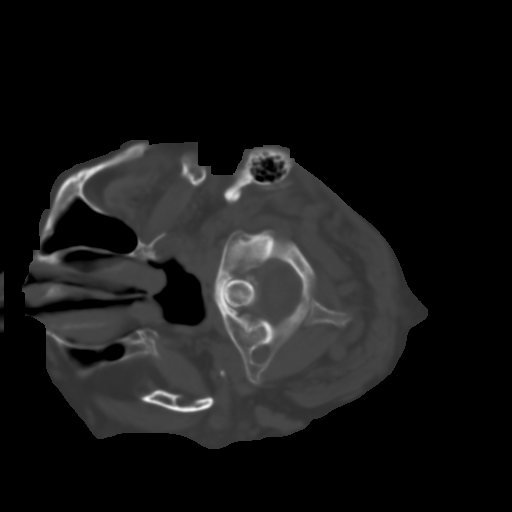
[im 5/33  brain]
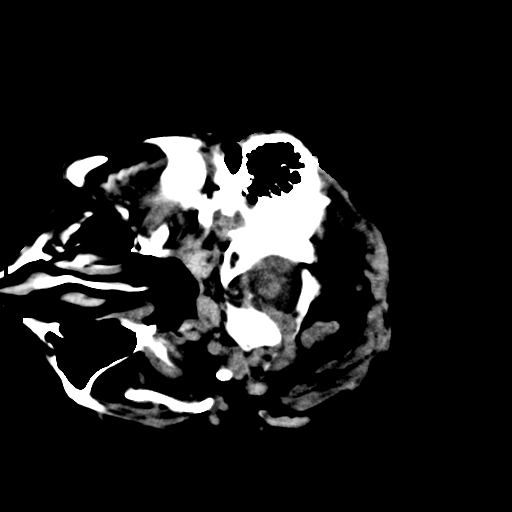
[im 7/33  brain]
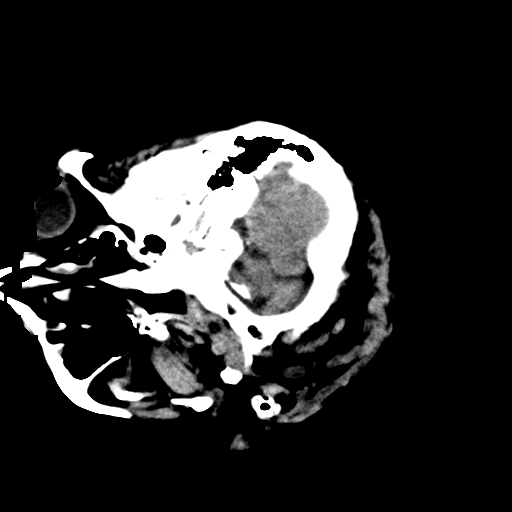
[im 10/33  brain]
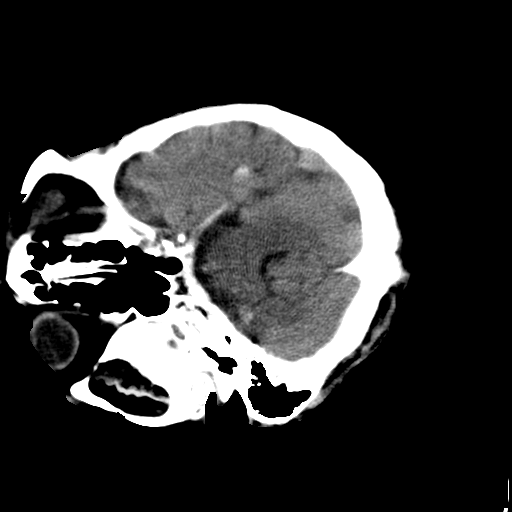
[im 12/33  brain]
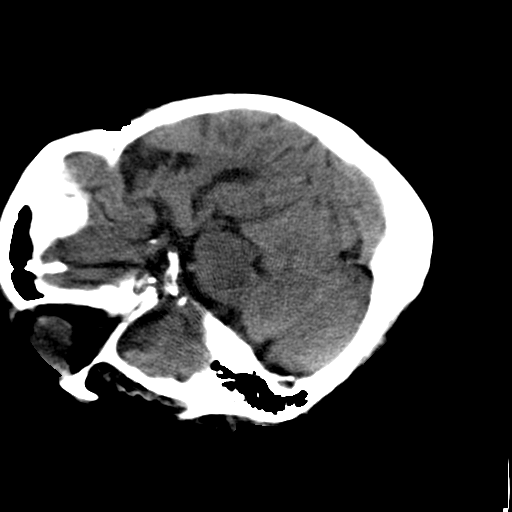
[im 12/33  bone]
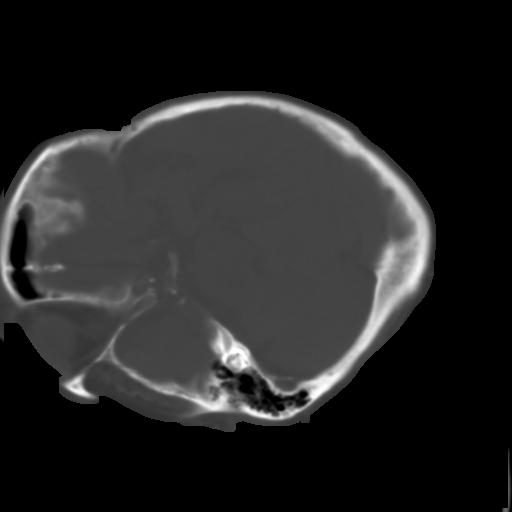
[im 14/33  brain]
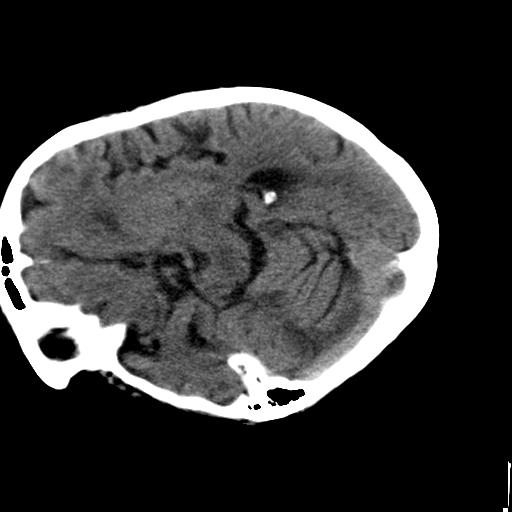
[im 17/33  brain]
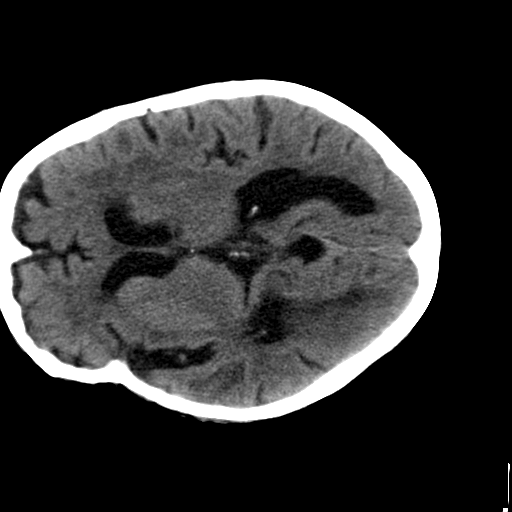
[im 19/33  brain]
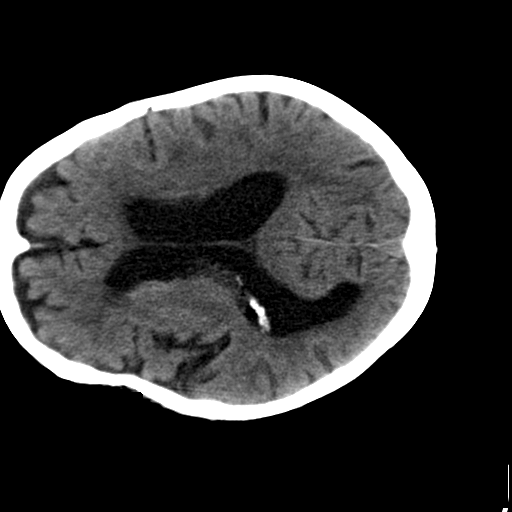
[im 21/33  brain]
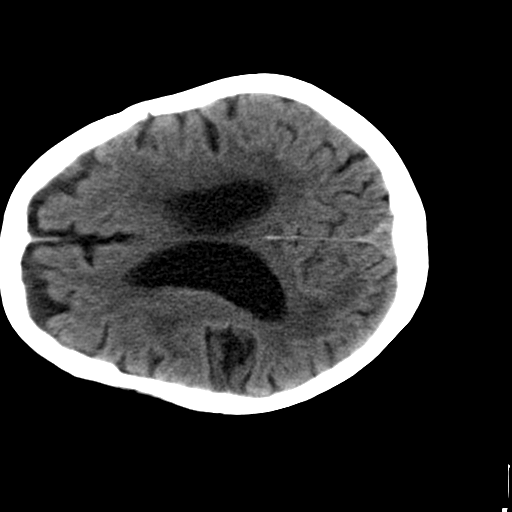
[im 21/33  bone]
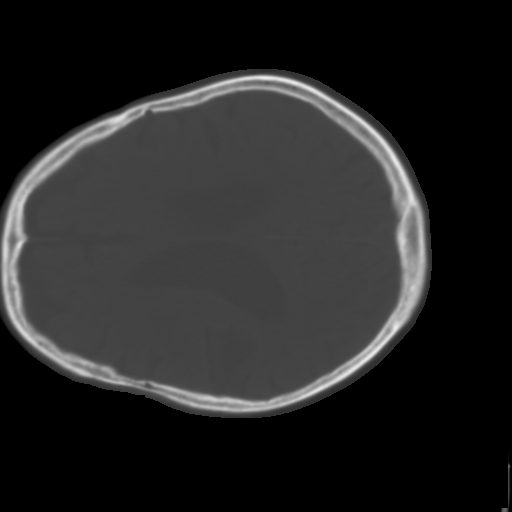
[im 23/33  brain]
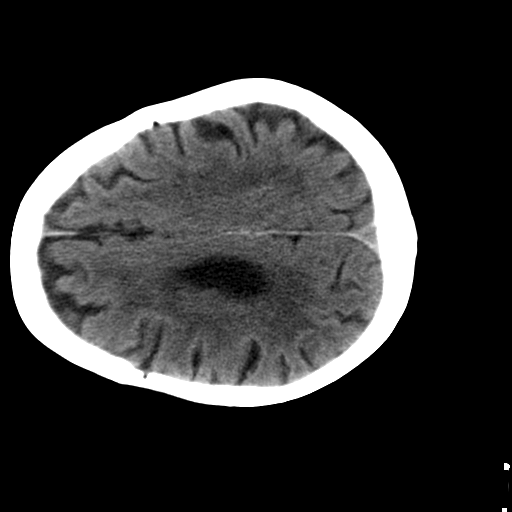
[im 26/33  brain]
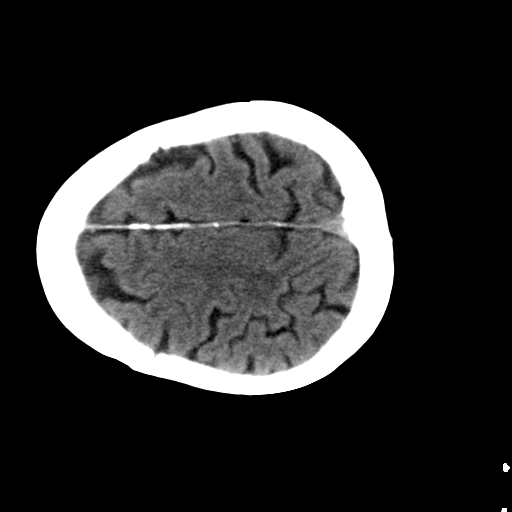
[im 28/33  brain]
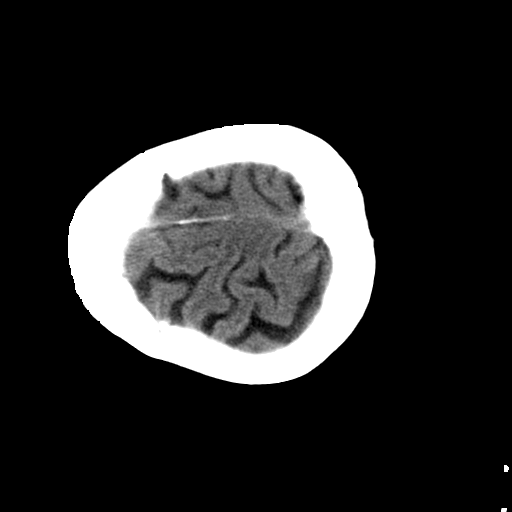
[im 30/33  brain]
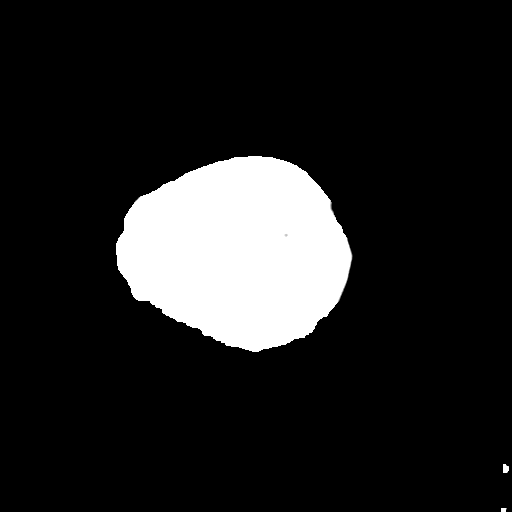
[im 30/33  bone]
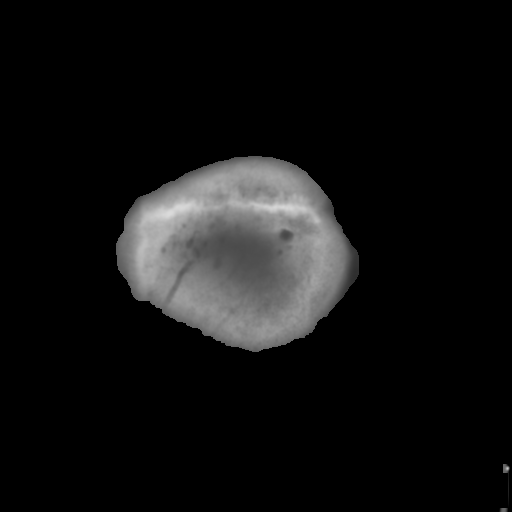

[Series 4: head wo recon · axial · 0.44mm/px · z∈[-120,-97]mm · 2 of 34 slices shown]
[im 3/34  brain]
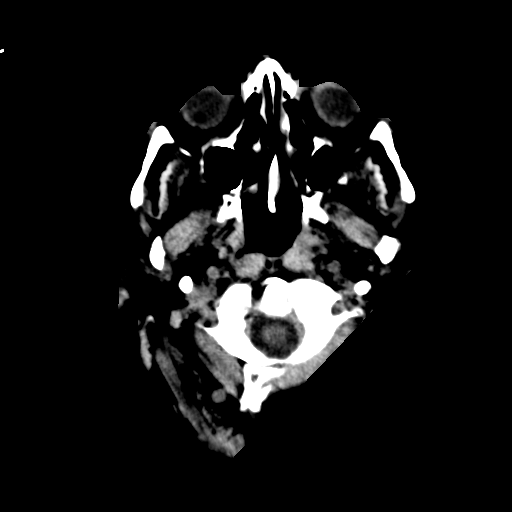
[im 8/34  brain]
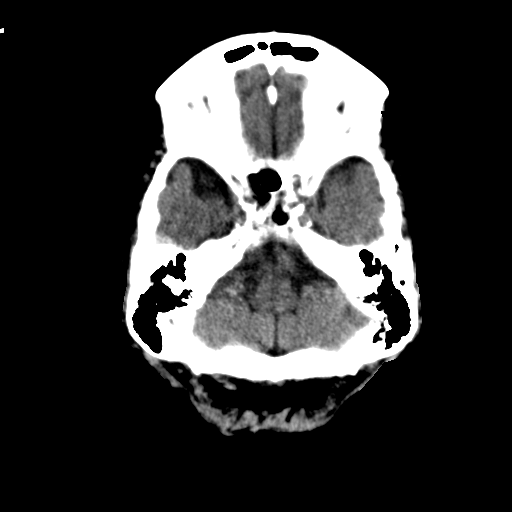

[15 of 30 positions shown; findings below may reference images not displayed]

FINDINGS: Mild cerebral atrophy. Patchy and confluent areas of decreased
attenuation are noted throughout the deep and periventricular white
matter of the cerebral hemispheres bilaterally, compatible with
chronic microvascular ischemic disease. Well-defined areas of low
attenuation are noted in the head of the caudate nuclei bilaterally,
compatible with old lacunar infarcts. No acute intracranial
abnormalities. Specifically, no evidence of acute intracranial
hemorrhage, no definite findings of acute/subacute cerebral
ischemia, no mass, mass effect, hydrocephalus or abnormal intra or
extra-axial fluid collections. Visualized paranasal sinuses and
mastoids are well pneumatized. No acute displaced skull fractures
are identified.
IMPRESSION: 1. No acute intracranial abnormalities.
2. Mild cerebral atrophy with extensive chronic microvascular
ischemic changes in the cerebral white matter and old lacunar
infarcts in the basal ganglia bilaterally, as above.
